# Patient Record
Sex: Male | Born: 1945
Health system: Southern US, Community
[De-identification: ages and names within clinical notes are randomized; demographics above are authoritative.]

## PROBLEM LIST (undated history)

## (undated) DIAGNOSIS — R1310 Dysphagia, unspecified: Secondary | ICD-10-CM

## (undated) DIAGNOSIS — G4733 Obstructive sleep apnea (adult) (pediatric): Secondary | ICD-10-CM

## (undated) DIAGNOSIS — N200 Calculus of kidney: Secondary | ICD-10-CM

## (undated) DIAGNOSIS — K449 Diaphragmatic hernia without obstruction or gangrene: Secondary | ICD-10-CM

## (undated) DIAGNOSIS — M1990 Unspecified osteoarthritis, unspecified site: Secondary | ICD-10-CM

## (undated) DIAGNOSIS — K5730 Diverticulosis of large intestine without perforation or abscess without bleeding: Secondary | ICD-10-CM

## (undated) DIAGNOSIS — I10 Essential (primary) hypertension: Secondary | ICD-10-CM

## (undated) DIAGNOSIS — T7840XA Allergy, unspecified, initial encounter: Secondary | ICD-10-CM

## (undated) DIAGNOSIS — E785 Hyperlipidemia, unspecified: Secondary | ICD-10-CM

## (undated) HISTORY — DX: Diaphragmatic hernia without obstruction or gangrene: K44.9

## (undated) HISTORY — DX: Obstructive sleep apnea (adult) (pediatric): G47.33

## (undated) HISTORY — DX: Hyperlipidemia, unspecified: E78.5

## (undated) HISTORY — PX: CARPAL TUNNEL RELEASE: SHX101

## (undated) HISTORY — PX: LITHOTRIPSY: SUR834

## (undated) HISTORY — PX: OTHER SURGICAL HISTORY: SHX169

## (undated) HISTORY — PX: CATARACT EXTRACTION: SUR2

## (undated) HISTORY — DX: Dysphagia, unspecified: R13.10

## (undated) HISTORY — DX: Allergy, unspecified, initial encounter: T78.40XA

## (undated) HISTORY — DX: Essential (primary) hypertension: I10

## (undated) HISTORY — DX: Calculus of kidney: N20.0

## (undated) HISTORY — DX: Diverticulosis of large intestine without perforation or abscess without bleeding: K57.30

---

## 1986-05-09 HISTORY — PX: APPENDECTOMY: SHX54

## 2003-03-12 ENCOUNTER — Emergency Department (HOSPITAL_COMMUNITY): Admission: EM | Admit: 2003-03-12 | Discharge: 2003-03-12 | Payer: Self-pay | Admitting: Emergency Medicine

## 2004-04-14 ENCOUNTER — Ambulatory Visit: Payer: Self-pay | Admitting: Family Medicine

## 2005-03-02 ENCOUNTER — Ambulatory Visit: Payer: Self-pay | Admitting: Family Medicine

## 2005-06-13 ENCOUNTER — Ambulatory Visit: Payer: Self-pay | Admitting: Family Medicine

## 2006-01-16 ENCOUNTER — Ambulatory Visit: Payer: Self-pay | Admitting: Family Medicine

## 2006-09-01 ENCOUNTER — Ambulatory Visit: Payer: Self-pay | Admitting: Gastroenterology

## 2006-09-15 ENCOUNTER — Ambulatory Visit: Payer: Self-pay | Admitting: Gastroenterology

## 2006-09-15 DIAGNOSIS — K5730 Diverticulosis of large intestine without perforation or abscess without bleeding: Secondary | ICD-10-CM

## 2006-09-15 HISTORY — DX: Diverticulosis of large intestine without perforation or abscess without bleeding: K57.30

## 2007-09-24 ENCOUNTER — Ambulatory Visit: Payer: Self-pay | Admitting: Family Medicine

## 2007-09-24 DIAGNOSIS — J22 Unspecified acute lower respiratory infection: Secondary | ICD-10-CM | POA: Insufficient documentation

## 2007-09-24 DIAGNOSIS — J069 Acute upper respiratory infection, unspecified: Secondary | ICD-10-CM | POA: Insufficient documentation

## 2007-09-24 DIAGNOSIS — J309 Allergic rhinitis, unspecified: Secondary | ICD-10-CM | POA: Insufficient documentation

## 2008-04-21 ENCOUNTER — Ambulatory Visit: Payer: Self-pay | Admitting: Internal Medicine

## 2008-04-21 DIAGNOSIS — N419 Inflammatory disease of prostate, unspecified: Secondary | ICD-10-CM | POA: Insufficient documentation

## 2008-04-21 LAB — CONVERTED CEMR LAB
Bilirubin Urine: NEGATIVE
Ketones, urine, test strip: NEGATIVE
Specific Gravity, Urine: 1.015
WBC Urine, dipstick: NEGATIVE
pH: 6

## 2008-05-23 ENCOUNTER — Encounter (INDEPENDENT_AMBULATORY_CARE_PROVIDER_SITE_OTHER): Payer: Self-pay | Admitting: Internal Medicine

## 2008-07-01 ENCOUNTER — Encounter (INDEPENDENT_AMBULATORY_CARE_PROVIDER_SITE_OTHER): Payer: Self-pay | Admitting: Internal Medicine

## 2008-07-09 ENCOUNTER — Ambulatory Visit: Payer: Self-pay | Admitting: Internal Medicine

## 2008-07-10 LAB — CONVERTED CEMR LAB
Calcium: 9.2 mg/dL (ref 8.4–10.5)
GFR calc Af Amer: 98 mL/min
GFR calc non Af Amer: 81 mL/min
Glucose, Bld: 100 mg/dL — ABNORMAL HIGH (ref 70–99)
HDL: 43 mg/dL (ref >39.0)
LDL Cholesterol: 120 mg/dL — ABNORMAL HIGH (ref 0–99)
Sodium: 139 meq/L (ref 135–145)
TSH: 0.77 microintl units/mL (ref 0.35–5.50)
VLDL: 12 mg/dL (ref 0–40)

## 2008-12-29 ENCOUNTER — Encounter (INDEPENDENT_AMBULATORY_CARE_PROVIDER_SITE_OTHER): Payer: Self-pay | Admitting: Internal Medicine

## 2009-02-13 ENCOUNTER — Ambulatory Visit: Payer: Self-pay | Admitting: Family Medicine

## 2009-02-13 DIAGNOSIS — H8110 Benign paroxysmal vertigo, unspecified ear: Secondary | ICD-10-CM

## 2009-05-04 ENCOUNTER — Telehealth: Payer: Self-pay | Admitting: Family Medicine

## 2009-05-12 ENCOUNTER — Ambulatory Visit: Payer: Self-pay | Admitting: Family Medicine

## 2009-05-12 DIAGNOSIS — R42 Dizziness and giddiness: Secondary | ICD-10-CM

## 2009-06-02 ENCOUNTER — Ambulatory Visit: Payer: Self-pay | Admitting: Family Medicine

## 2009-06-02 DIAGNOSIS — J019 Acute sinusitis, unspecified: Secondary | ICD-10-CM | POA: Insufficient documentation

## 2009-06-02 DIAGNOSIS — H6120 Impacted cerumen, unspecified ear: Secondary | ICD-10-CM

## 2009-06-19 ENCOUNTER — Ambulatory Visit: Payer: Self-pay | Admitting: Family Medicine

## 2009-12-15 ENCOUNTER — Encounter (INDEPENDENT_AMBULATORY_CARE_PROVIDER_SITE_OTHER): Payer: Self-pay | Admitting: *Deleted

## 2010-01-05 ENCOUNTER — Encounter: Payer: Self-pay | Admitting: Family Medicine

## 2010-02-04 ENCOUNTER — Encounter: Payer: Self-pay | Admitting: Family Medicine

## 2010-04-07 ENCOUNTER — Ambulatory Visit: Payer: Self-pay | Admitting: Internal Medicine

## 2010-06-08 NOTE — Letter (Signed)
Summary: Nadara Eaton letter  [place] [facility]  [address] [place], [place] [address]   Phone: [phone]  Fax: [phone]       12/15/2009 MRN: [id number]  [facility] [address] [place], [place]  [address]  Dear Mr. BAYRON,  [facility] [place], and [facility] announce the retirement of Arta Silence, M.D., from full-time practice at the [facility] office effective November 05, 2009 and his plans of returning part-time.  It is important to Dr. Hetty Ely and to our practice that you understand that [facility] has seven physicians in our office for your health care needs.  We will continue to offer the same exceptional care that you have today.    Dr. Hetty Ely has spoken to many of you about his plans for retirement and returning part-time in the fall.   We will continue to work with you through the transition to schedule appointments for you in the office and meet the high standards that [place] is committed to.   Again, it is with great pleasure that we share the news that Dr. Hetty Ely will return to [facility] in October of 2011 with a reduced schedule.    If you have any questions, or would like to request an appointment with one of our physicians, please call us at [phone] and press the option for Scheduling an appointment.  We take pleasure in providing you with excellent patient care and look forward to seeing you at your next office visit.  Our [facility] Physicians are:  Tillman Abide, M.D. Laurita Quint, M.D. Roxy Manns, M.D. Kerby Nora, M.D. Hannah Beat, M.D. Ruthe Mannan, M.D. We proudly welcomed Raechel Ache, M.D. and Eustaquio Boyden, M.D. to the practice in July/August 2011.  Sincerely,  [place] [facility] [facility]

## 2010-06-08 NOTE — Letter (Signed)
Summary: [facility]  [facility]   Imported By: Lanelle Bal 02/12/2010 09:05:50  _____________________________________________________________________  External Attachment:    Type:   Image     Comment:   External Document  Appended Document: [facility]    Clinical Lists Changes  Observations: Added new observation of PAST MED HX: Diverticulosis, colon (09/15/2006) all rhinitis  hx of nasal polyps PSA and nephrolithiasis per Dr. Aldean Ast (02/14/2010 22:40)       Past History:  Past Medical History: Diverticulosis, colon (09/15/2006) all rhinitis  hx of nasal polyps PSA and nephrolithiasis per Dr. Aldean Ast

## 2010-06-08 NOTE — Letter (Signed)
Summary: [facility]  [facility]   Imported By: Lanelle Bal 01/13/2010 14:04:07  _____________________________________________________________________  External Attachment:    Type:   Image     Comment:   External Document  Appended Document: [facility]    Clinical Lists Changes  Observations: Added new observation of PAST MED HX: Diverticulosis, colon (09/15/2006) all rhinitis  hx of nasal polyps nephrolithiasis per Dr. Aldean Ast (01/13/2010 22:58)       Past History:  Past Medical History: Diverticulosis, colon (09/15/2006) all rhinitis  hx of nasal polyps nephrolithiasis per Dr. Aldean Ast

## 2010-06-08 NOTE — Assessment & Plan Note (Signed)
Summary: COUGH,ST/CLE   Vital Signs:  Patient profile:   65 year old male Height:      69 inches Weight:      210.50 pounds BMI:     31.20 Temp:     97.6 degrees F oral Pulse rate:   68 / minute Pulse rhythm:   regular BP sitting:   110 / 60  (left arm) Cuff size:   large  Vitals Entered By: Delilah Shan, CMA (AAMA) (April 07, 2010 12:10 PM) CC: Cough, ST   History of Present Illness: CC: ST, cough  1wk h/o ST, congestion and cough at night.  Spitting up yellow sputum with cough.  + mild nausea.  Mild frontal sinus fullness.  Has tried advil 400mg  nightly, vicks vapo rub.  Cough keeping up at night.  Doesn't feel coughing up enough sputum.  h/o sinus infections and surgery in past.  h/o nasal polyps  No HA.  No f/c, abd pain, vomiting, rashes, myalgia/arthralgias.  No ear pain, tooth pain.  + grandson with cough.  daughter smokes outside.  Current Medications (verified): 1)  Flonase 50 Mcg/act Susp (Fluticasone Propionate) .... 2 Sprays in Each Nostril Once Daily  Allergies (verified): No Known Drug Allergies  Past History:  Past Medical History: Last updated: 02/14/2010 Diverticulosis, colon (09/15/2006) all rhinitis  hx of nasal polyps PSA and nephrolithiasis per Dr. Aldean Ast  Past Surgical History: Last updated: 06/02/2009 L) knee repair 1970's appendectomy 1988 colonoscopy 09/15/2006 sinus surgery for polyps   Social History: Last updated: 07/09/2008 Marital Status: Married Children: no children, 1 step daughter, 1 grand son Occupation: Surveyor, minerals  Review of Systems       per HPI  Physical Exam  General:  Well-developed,well-nourished,in no acute distress; alert,appropriate and cooperative throughout examination, nontoxic Head:  normocephalic, atraumatic, and no abnormalities observed.  No sinus tenderness Eyes:  no injection.   PERRLA, EOMI   Ears:  canals with small amt of cerumen only- much imp  TMs are clear  Nose:  no nasal discharge.    Mouth:  pharynx pink and moist.  no exudates Neck:  No deformities, masses, or tenderness noted.  + R AC LAD Lungs:  Normal respiratory effort, chest expands symmetrically. Lungs are clear to auscultation, no crackles or wheezes. Heart:  Normal rate and regular rhythm. S1 and S2 normal without gallop, murmur, click, rub or other extra sounds. Pulses:  2+ rad pulses Extremities:  no pedal edema Skin:  no rashes   Impression & Recommendations:  Problem # 1:  SINUSITIS - ACUTE-NOS (ICD-461.9) could be early sinusitis vs viral URTI.  treat supportively for now given only 1 wk sxs.  If worsening to call with update.  if not better by Monday, call for abx course.    The following medications were removed from the medication list:    Tessalon 200 Mg Caps (Benzonatate) .Marland Kitchen... 1 by mouth up to three times a day as needed cough His updated medication list for this problem includes:    Flonase 50 Mcg/act Susp (Fluticasone propionate) .Marland Kitchen... 2 sprays in each nostril once daily    Cheratussin Ac 100-10 Mg/45ml Syrp (Guaifenesin-codeine) ..... One teaspoon every 6 hours as needed cough, mainly for night time use  Complete Medication List: 1)  Flonase 50 Mcg/act Susp (Fluticasone propionate) .... 2 sprays in each nostril once daily 2)  Cheratussin Ac 100-10 Mg/72ml Syrp (Guaifenesin-codeine) .... One teaspoon every 6 hours as needed cough, mainly for night time use  Patient Instructions: 1)  You have a sinus infection.  Most are viral.   2)  Take medicines as prescribed: cheratussin for night time use 3)  Take guaifenesin 400mg  IR 1 pills in am and at noon with plenty of fluid to help mobilize mucous. 4)  Use nasal saline spray or neti pot to help drainage of sinuses. 5)  If you start having fevers >101.5, trouble swallowing or breathing, or are worsening instead of improving as expected, you may need to be seen again. 6)  Good to see you today, call clinic with questions.  Prescriptions: CHERATUSSIN  AC 100-10 MG/5ML SYRP (GUAIFENESIN-CODEINE) one teaspoon every 6 hours as needed cough, mainly for night time use  #100cc x 0   Entered and Authorized by:   Eustaquio Boyden  MD   Signed by:   Eustaquio Boyden  MD on 04/07/2010   Method used:   Print then Give to Patient   RxID:   [id number]    Orders Added: 1)  Est. Patient Level III [20254]    Current Allergies (reviewed today): No known allergies

## 2010-06-08 NOTE — Assessment & Plan Note (Signed)
Summary: 2-3 WEEK FOLLOW UP RECHECK AND FLUSH OUT EARS/RBH   Vital Signs:  Patient profile:   65 year old male Height:      69 inches Weight:      204.75 pounds BMI:     30.35 Temp:     98 degrees F oral Pulse rate:   64 / minute Pulse rhythm:   regular BP sitting:   116 / 78  (left arm) Cuff size:   large  Vitals Entered By: Lewanda Rife LPN (June 19, 2009 8:07 AM)  History of Present Illness: never used the debrox in his ears -neglected to do that   has been using baby oil instead they do not feel as plugged  still a tiny bit of dizziness when he gets up quickly  other symptoms are better     Allergies (verified): No Known Drug Allergies  Review of Systems General:  Denies chills, fatigue, fever, loss of appetite, and malaise. Eyes:  Denies discharge and eye irritation. ENT:  Denies ear discharge, earache, nasal congestion, postnasal drainage, sinus pressure, and sore throat. CV:  Denies chest pain or discomfort and palpitations. Resp:  Denies cough and wheezing. GI:  Denies nausea. Neuro:  Complains of sensation of room spinning; denies tingling, tremors, and visual disturbances. Allergy:  Complains of seasonal allergies.  Physical Exam  General:  Well-developed,well-nourished,in no acute distress; alert,appropriate and cooperative throughout examination Head:  normocephalic, atraumatic, and no abnormalities observed.   Eyes:  no injection.   no nystagmus  Ears:  canals with small amt of cerumen only- much imp  TMs are clear  Nose:  no nasal discharge.   Mouth:  pharynx pink and moist.   Neck:  No deformities, masses, or tenderness noted. Lungs:  Normal respiratory effort, chest expands symmetrically. Lungs are clear to auscultation, no crackles or wheezes. Heart:  Normal rate and regular rhythm. S1 and S2 normal without gallop, murmur, click, rub or other extra sounds. Skin:  Intact without suspicious lesions or rashes Cervical Nodes:  No lymphadenopathy  noted Psych:  normal affect, talkative and pleasant    Impression & Recommendations:  Problem # 1:  CERUMEN IMPACTION (ICD-380.4) Assessment Improved much improved after use of baby oil in ears  will continue that or debrox as needed  no need for irrigation  Problem # 2:  VERTIGO (ICD-780.4) Assessment: Improved much improved after tx of sinusitis and imp of cerumen impaction  still occ symptoms with change in position  will continue meclizine only if needed  adv that if symptoms are not resolved in 2 wk (or worse or new sympt) will ref to ENT for further eval  The following medications were removed from the medication list:    Promethazine Hcl 25 Mg Tabs (Promethazine hcl) .Marland Kitchen... 1 tab by mouth every 6 hours as needed nausea His updated medication list for this problem includes:    Meclizine Hcl 25 Mg Tabs (Meclizine hcl) .Marland Kitchen... 1 by mouth two times a day as needed vertigo  Problem # 3:  ALLERGIC RHINITIS (ICD-477.9) Assessment: Improved flonase helps- will continue this in season when he needs it  The following medications were removed from the medication list:    Promethazine Hcl 25 Mg Tabs (Promethazine hcl) .Marland Kitchen... 1 tab by mouth every 6 hours as needed nausea His updated medication list for this problem includes:    Flonase 50 Mcg/act Susp (Fluticasone propionate) .Marland Kitchen... 2 sprays in each nostril once daily  Complete Medication List: 1)  Meclizine Hcl 25  Mg Tabs (Meclizine hcl) .Marland Kitchen.. 1 by mouth two times a day as needed vertigo 2)  Valium 2 Mg Tabs (Diazepam) .... Take 1 at bedtime for vertigo 3)  Flonase 50 Mcg/act Susp (Fluticasone propionate) .... 2 sprays in each nostril once daily 4)  Tessalon 200 Mg Caps (Benzonatate) .Marland Kitchen.. 1 by mouth up to three times a day as needed cough  Patient Instructions: 1)  continue the baby oil or debrox (which you can buy over the counter) for ear wax 1-2 times per week  2)  continue flonase if you needed  3)  update me if vertigo is not gone  in several weeks   Current Allergies (reviewed today): No known allergies

## 2010-06-08 NOTE — Assessment & Plan Note (Signed)
Summary: sinus/dlo   Vital Signs:  Patient profile:   65 year old male Weight:      217 pounds Temp:     97.6 degrees F oral Pulse rate:   68 / minute Pulse rhythm:   regular BP sitting:   128 / 82  (left arm) Cuff size:   large  Vitals Entered By: Lowella Petties CMA (June 02, 2009 8:09 AM) CC: Sinus congestion and cough.   History of Present Illness: has ongoing sinus problems and cannot get rid of vertigo completely  cannot stop coughing   has been here 3 times -- for vertigo / is afraid to take the valium has not been taking meclizine (instead used otc dramamine - and that helps a bit)  vertigo is improved but not gone-- worst to turn over in bed/ has to move head slowly   nasal symptoms -- is congestion -- worse at night  nasal d/c is clear  coughed up a bit of green mucous  face hurt over eyes this weekend  no fever   R ear feels full but does not hurt   throat is ok -- but occ raw when he drinks pepsi   non smoker   no wheeze or sob   is prone to sinus infections     Allergies: No Known Drug Allergies  Past History:  Social History: Last updated: 07/09/2008 Marital Status: Married Children: no children, 1 step daughter, 1 grand son Occupation: Surveyor, minerals  Risk Factors: Alcohol Use: 1 (09/24/2007) Caffeine Use: 4 (09/24/2007) Exercise: no (09/24/2007)  Risk Factors: Smoking Status: quit (09/24/2007) Passive Smoke Exposure: no (07/09/2008)  Past Medical History: Diverticulosis, colon (09/15/2006) all rhinitis  hx of nasal polyps  Past Surgical History: L) knee repair 1970's appendectomy 1988 colonoscopy 09/15/2006 sinus surgery for polyps   Review of Systems General:  Complains of fatigue; denies chills and fever. Eyes:  Denies blurring and eye irritation. ENT:  Complains of earache, hoarseness, nasal congestion, postnasal drainage, sinus pressure, and sore throat. CV:  Denies chest pain or discomfort and palpitations. Resp:   Complains of cough and sputum productive; denies shortness of breath and wheezing. GI:  Denies diarrhea, nausea, and vomiting. Derm:  Denies lesion(s), poor wound healing, and rash. Neuro:  Complains of poor balance and sensation of room spinning; denies falling down, headaches, numbness, and tingling. Heme:  Denies abnormal bruising and bleeding.  Physical Exam  General:  alert, well-developed, well-nourished, and well-hydrated.  NAD Head:  normocephalic, atraumatic, and no abnormalities observed.  no sinus or temporal tenderness  Eyes:  vision grossly intact, pupils equal, pupils round, pupils reactive to light, and no injection.   1-2 beats horizontal nystagmus  Ears:  cerumen impaction- worse on R  Nose:  nares are injected and congested bilat Mouth:  pharynx pink and moist, no erythema, and no exudates.   Neck:  supple with full rom and no masses or thyromegally, no JVD or carotid bruit  Lungs:  Normal respiratory effort, chest expands symmetrically. Lungs are clear to auscultation, no crackles or wheezes. Heart:  Normal rate and regular rhythm. S1 and S2 normal without gallop, murmur, click, rub or other extra sounds. Neurologic:  cranial nerves II-XII intact, sensation intact to pinprick, and gait normal.  nl pronator drift no focal cerebellar signs  Skin:  Intact without suspicious lesions or rashes Cervical Nodes:  No lymphadenopathy noted Psych:  normal affect, talkative and pleasant    Impression & Recommendations:  Problem # 1:  SINUSITIS -  ACUTE-NOS (ICD-461.9) Assessment New  with congestion for several mo in pt with past hx of sinus surgery  suspect vertigo is related will tx with augmentin and flonase- update  tessalon for cough if not imp- consider ENT consult His updated medication list for this problem includes:    Augmentin 875-125 Mg Tabs (Amoxicillin-pot clavulanate) .Marland Kitchen... 1 by mouth two times a day for 10 days for sinus infection    Flonase 50 Mcg/act Susp  (Fluticasone propionate) .Marland Kitchen... 2 sprays in each nostril once daily    Tessalon 200 Mg Caps (Benzonatate) .Marland Kitchen... 1 by mouth up to three times a day as needed cough  Orders: Prescription Created Electronically [phone])  Problem # 2:  VERTIGO (ICD-780.4) Assessment: Improved  improved but not gone hope with tx of sinus infx this will imp / also cerumen impaction if not imp in 10 d-- consider ent ref  His updated medication list for this problem includes:    Meclizine Hcl 25 Mg Tabs (Meclizine hcl) .Marland Kitchen... 1 by mouth two times a day as needed vertigo    Promethazine Hcl 25 Mg Tabs (Promethazine hcl) .Marland Kitchen... 1 tab by mouth every 6 hours as needed nausea  Orders: Prescription Created Electronically [phone])  Problem # 3:  CERUMEN IMPACTION (ICD-380.4) Assessment: New  worse on R and dry appearing  adv to use debrox 3 times weekly- and then f/u to irrigate  adv to update if ear pain or worse  Orders: Prescription Created Electronically [phone])  Complete Medication List: 1)  Meclizine Hcl 25 Mg Tabs (Meclizine hcl) .Marland Kitchen.. 1 by mouth two times a day as needed vertigo 2)  Promethazine Hcl 25 Mg Tabs (Promethazine hcl) .Marland Kitchen.. 1 tab by mouth every 6 hours as needed nausea 3)  Valium 2 Mg Tabs (Diazepam) .... Take 1 at bedtime for vertigo 4)  Augmentin 875-125 Mg Tabs (Amoxicillin-pot clavulanate) .Marland Kitchen.. 1 by mouth two times a day for 10 days for sinus infection 5)  Flonase 50 Mcg/act Susp (Fluticasone propionate) .... 2 sprays in each nostril once daily 6)  Tessalon 200 Mg Caps (Benzonatate) .Marland Kitchen.. 1 by mouth up to three times a day as needed cough  Patient Instructions: 1)  px were sent to [facility]  2)  take the augmentin as directed and use nasal spray for at least 1 month 3)  tessalon for cough as needed 4)  update me if symptoms worsen  5)  if symptoms -- congestion and dizziness- are not better in 10 days- please call for ENT referral  6)  get debrox ear solution over the counter- and use as  directed 3 times per week to loosen ear wax  7)  follow up here in 2-3 weeks - to re check and flush out ears  Prescriptions: TESSALON 200 MG CAPS (BENZONATATE) 1 by mouth up to three times a day as needed cough  #30 x 0   Entered and Authorized by:   Judith Part MD   Signed by:   Judith Part MD on 06/02/2009   Method used:   Electronically to        [facility]* (retail)       [address]       [place], [place]  [address]       Ph: [phone]       Fax: [phone]   RxID:   [id number] FLONASE 50 MCG/ACT SUSP (FLUTICASONE PROPIONATE) 2 sprays in each nostril once daily  #1 mdi x 3   Entered and Authorized  by:   Judith Part MD   Signed by:   Judith Part MD on 06/02/2009   Method used:   Electronically to        [facility]* (retail)       [address]       [place], [place]  [address]       Ph: [phone]       Fax: [phone]   RxID:   [id number] AUGMENTIN 875-125 MG TABS (AMOXICILLIN-POT CLAVULANATE) 1 by mouth two times a day for 10 days for sinus infection  #20 x 0   Entered and Authorized by:   Judith Part MD   Signed by:   Judith Part MD on 06/02/2009   Method used:   Electronically to        [facility]* (retail)       [address]       [place], [place]  [address]       Ph: [phone]       Fax: [phone]   RxID:   [id number]   Prior Medications (reviewed today): MECLIZINE HCL 25 MG TABS (MECLIZINE HCL) 1 by mouth two times a day as needed vertigo PROMETHAZINE HCL 25 MG TABS (PROMETHAZINE HCL) 1 tab by mouth every 6 hours as needed nausea VALIUM 2 MG TABS (DIAZEPAM) take 1 at bedtime for vertigo AUGMENTIN 875-125 MG TABS (AMOXICILLIN-POT CLAVULANATE) 1 by mouth two times a day for 10 days for sinus infection FLONASE 50 MCG/ACT SUSP (FLUTICASONE PROPIONATE) 2 sprays in each nostril once daily TESSALON 200 MG CAPS (BENZONATATE) 1 by mouth up to three times a day as needed cough Current Allergies: No known  allergies

## 2010-06-08 NOTE — Assessment & Plan Note (Signed)
Summary: SINUS INFECTION, VERTIGO   Vital Signs:  Patient profile:   65 year old male Height:      69 inches Weight:      214.25 pounds BMI:     31.75 Temp:     98 degrees F oral Pulse rate:   64 / minute Pulse rhythm:   regular BP sitting:   116 / 80  (left arm) Cuff size:   large  Vitals Entered By: Lewanda Rife LPN (May 12, 2009 2:19 PM)  CC:  sinus infection, vertigo, and head congested and pressure feeling in forehead.  History of Present Illness: Here for URI signs--mild, dizziness/ swimmiheaded --onset 12/25 or so --taking Meclazine 12.5-takes 2 at a time  ~3-4 times a day--helps, but not resolved --post nasal drip --has hx of vertigo, responded to meclazine --non-smoker  Allergies (verified): No Known Drug Allergies  Past History:  Past Medical History: Reviewed history from 09/26/2007 and no changes required. Diverticulosis, colon (09/15/2006)  Review of Systems ENT:  See HPI; Complains of nasal congestion and sore throat; denies earache; cant "pop" ears. Resp:  Complains of cough; denies shortness of breath and wheezing. GI:  Denies nausea and vomiting. Neuro:  See HPI.  Physical Exam  General:  alert, well-developed, well-nourished, and well-hydrated.  NAD Ears:  TMs retracted with increased fluid Nose:  no mucosal edema, no airflow obstruction, and mucosal erythema.  sinuses neg Mouth:  no exudates and pharyngeal erythema.   Lungs:  normal respiratory effort, no intercostal retractions, no accessory muscle use, normal breath sounds, no crackles, and no wheezes.  moist cough only Neurologic:  alert & oriented X3, cranial nerves II-XII intact, strength normal in all extremities, sensation intact to light touch, gait normal, and finger-to-nose normal.   Psych:  normally interactive and good eye contact.     Impression & Recommendations:  Problem # 1:  VERTIGO (ICD-780.4) Assessment Deteriorated reoccurance of mild vertigo  will continue use of  meclizine during the day and try low dose valium at hs--understands see back in 7-10d if not improved His updated medication list for this problem includes:    Meclizine Hcl 25 Mg Tabs (Meclizine hcl) .Marland Kitchen... 1 by mouth two times a day as needed vertigo    Promethazine Hcl 25 Mg Tabs (Promethazine hcl) .Marland Kitchen... 1 tab by mouth every 6 hours as needed nausea  Problem # 2:  URI (ICD-465.9) Assessment: New meclazine should help with ears, continue comfort care measures: increase po fluids, rest, tylenol or IBP as needed  see back if signs worsen His updated medication list for this problem includes:    Promethazine Hcl 25 Mg Tabs (Promethazine hcl) .Marland Kitchen... 1 tab by mouth every 6 hours as needed nausea  Complete Medication List: 1)  Meclizine Hcl 25 Mg Tabs (Meclizine hcl) .Marland Kitchen.. 1 by mouth two times a day as needed vertigo 2)  Promethazine Hcl 25 Mg Tabs (Promethazine hcl) .Marland Kitchen.. 1 tab by mouth every 6 hours as needed nausea 3)  Valium 2 Mg Tabs (Diazepam) .... Take 1 at bedtime for vertigo Prescriptions: VALIUM 2 MG TABS (DIAZEPAM) take 1 at bedtime for vertigo  #30 x 0   Entered and Authorized by:   Gildardo Griffes FNP   Signed by:   Gildardo Griffes FNP on 05/12/2009   Method used:   Print then Give to Patient   RxID:   [id number]   Current Allergies (reviewed today): No known allergies

## 2011-01-18 ENCOUNTER — Ambulatory Visit (INDEPENDENT_AMBULATORY_CARE_PROVIDER_SITE_OTHER): Payer: BC Managed Care – PPO | Admitting: Family Medicine

## 2011-01-18 ENCOUNTER — Encounter: Payer: Self-pay | Admitting: Family Medicine

## 2011-01-18 VITALS — BP 122/60 | HR 60 | Temp 97.5°F | Wt 209.1 lb

## 2011-01-18 DIAGNOSIS — R300 Dysuria: Secondary | ICD-10-CM | POA: Insufficient documentation

## 2011-01-18 LAB — POCT URINALYSIS DIPSTICK
Glucose, UA: NEGATIVE
Ketones, UA: NEGATIVE
Protein, UA: NEGATIVE
Spec Grav, UA: 1.02

## 2011-01-18 MED ORDER — CIPROFLOXACIN HCL 500 MG PO TABS: 500.0000 mg | ORAL_TABLET | Freq: Two times a day (BID) | ORAL | Status: AC

## 2011-01-18 NOTE — Progress Notes (Signed)
Pain with urination. Some stinging with urination, mild.  It feels similar to prev.  Had seen Dr. Damian Leavell with uro. Recent sx started over the weekend.  He had some chills over the weekend.  Pain with urination started about the same time.  No vomiting, diarrhea.  Feels some better now, but still with dysuria.  Has f/u with uro in October. No abd pain (h/o kidney stones).  No testicle pain.  Monogamous.    Meds, vitals, and allergies reviewed.   ROS: See HPI.  Otherwise, noncontributory.  nad rrr ctab Prostate not ttp or boggy Testes bilaterally descended without nodularity, tenderness or masses. No scrotal masses or lesions. No penis lesions or urethral discharge. UA with 1+ LE.

## 2011-01-18 NOTE — Patient Instructions (Signed)
Start the antibiotics today.  We'll contact you with your lab report. Let us know if you aren't gradually improving.  Take care.

## 2011-01-18 NOTE — Assessment & Plan Note (Addendum)
Check ucx and start cipro.  If this is prostatitis, it's mild. I would start with 2 weeks of abx given the exam and then f/u prn.  Goal to balance treatment with potential side effects of the abx.  D/w pt and he understood, okay for outpatient f/u.

## 2011-01-23 LAB — URINE CULTURE: Colony Count: 15000

## 2011-11-25 ENCOUNTER — Ambulatory Visit (INDEPENDENT_AMBULATORY_CARE_PROVIDER_SITE_OTHER): Payer: BC Managed Care – PPO | Admitting: Family Medicine

## 2011-11-25 DIAGNOSIS — Z639 Problem related to primary support group, unspecified: Secondary | ICD-10-CM

## 2011-11-28 DIAGNOSIS — Z639 Problem related to primary support group, unspecified: Secondary | ICD-10-CM | POA: Insufficient documentation

## 2011-11-28 NOTE — Progress Notes (Signed)
Pt came in with spouse to discuss daughter's recent event.  See spouse's and daughter's record.  Daughter wasn't present.  I didn't give information about daughter, I only listened and asked questions.  Recent history given by daughter verified.  I'll await written permission from daughter to talk with parents.  No charge for visit.   [place],Becky T  Verba,Hiep V Hargett,Meredith L

## 2012-01-31 ENCOUNTER — Other Ambulatory Visit: Payer: Self-pay | Admitting: Family Medicine

## 2012-01-31 DIAGNOSIS — M25569 Pain in unspecified knee: Secondary | ICD-10-CM

## 2012-01-31 NOTE — Progress Notes (Signed)
Pt's wife reported that pt wanted ortho referral.  Done.

## 2012-03-13 ENCOUNTER — Ambulatory Visit (INDEPENDENT_AMBULATORY_CARE_PROVIDER_SITE_OTHER): Payer: Medicare Other | Admitting: Family Medicine

## 2012-03-13 ENCOUNTER — Encounter: Payer: Self-pay | Admitting: Family Medicine

## 2012-03-13 VITALS — BP 120/80 | HR 64 | Temp 97.7°F | Wt 211.0 lb

## 2012-03-13 DIAGNOSIS — R109 Unspecified abdominal pain: Secondary | ICD-10-CM

## 2012-03-13 LAB — COMPREHENSIVE METABOLIC PANEL
Albumin: 4.3 g/dL (ref 3.5–5.2)
BUN: 18 mg/dL (ref 6–23)
Calcium: 9.2 mg/dL (ref 8.4–10.5)
Chloride: 102 mEq/L (ref 96–112)
Glucose, Bld: 92 mg/dL (ref 70–99)
Potassium: 4.1 mEq/L (ref 3.5–5.1)
Total Protein: 7.6 g/dL (ref 6.0–8.3)

## 2012-03-13 LAB — CBC WITH DIFFERENTIAL/PLATELET
Basophils Relative: 0.4 % (ref 0.0–3.0)
Eosinophils Relative: 3 % (ref 0.0–5.0)
Lymphocytes Relative: 19.8 % (ref 12.0–46.0)
Monocytes Relative: 8.5 % (ref 3.0–12.0)
Neutrophils Relative %: 68.3 % (ref 43.0–77.0)
RBC: 4.34 Mil/uL (ref 4.22–5.81)
WBC: 10.7 10*3/uL — ABNORMAL HIGH (ref 4.5–10.5)

## 2012-03-13 LAB — POCT URINALYSIS DIPSTICK
Leukocytes, UA: NEGATIVE
Spec Grav, UA: 1.025
Urobilinogen, UA: NEGATIVE
pH, UA: 7

## 2012-03-13 NOTE — Patient Instructions (Addendum)
Go to the lab on the way out.  We'll contact you with your lab report. Call with an update tomorrow.  If you suddenly have profound abdominal pain, or pain that doesn't relent, then go to the ER.  Take care.

## 2012-03-13 NOTE — Progress Notes (Signed)
Had an upset stomach ("I felt like I would have diarrhea but I didn't") over the weekend but that resolved and was okay until last night.  Last night he had R sided abd pain.  No trigger known last night.  Happened before going to sleep, lasted about 15 min then resolved.  Was able to sleep okay last night.  Minimal pain this AM but then got worse again today at lunch time, before eating lunch.  Pain decreased in the meantime.  Did eat breakfast today.  Had felt bloated some after eating.  No diarrhea, blood in stool, vomiting.  No fevers.   Urine looked darker than normal this AM.  Not burning with urination.  No pelvic pain.  No constipation.  Hasn't needed pain meds recently.    H/o appendectomy.  colonoscopy 2008.  Knee surgery 02/23/12.   PMH and SH reviewed  ROS: See HPI, otherwise noncontributory.  Meds, vitals, and allergies reviewed.   GEN: nad, alert and oriented, not uncomfortable.  HEENT: mucous membranes moist NECK: supple w/o LA CV: rrr.  PULM: ctab, no inc wob ABD: soft, +bs, not ttp except for very minimal RLQ tenderness w/o rebound, no guarding EXT: no edema SKIN: no acute rash

## 2012-03-14 ENCOUNTER — Encounter: Payer: Self-pay | Admitting: Family Medicine

## 2012-03-14 ENCOUNTER — Ambulatory Visit: Payer: BC Managed Care – PPO | Admitting: Family Medicine

## 2012-03-14 DIAGNOSIS — R109 Unspecified abdominal pain: Secondary | ICD-10-CM | POA: Insufficient documentation

## 2012-03-14 LAB — URINALYSIS, MICROSCOPIC ONLY
Bacteria, UA: NONE SEEN
Casts: NONE SEEN
RBC / HPF: >50 RBC/hpf — AB (ref <3)

## 2012-03-14 NOTE — Assessment & Plan Note (Addendum)
H/o diverticulosis but with waxing/waning sx, lack of fever, lack of LLQ pain, no diarrhea or blood in stool.  I don't suspect ominous dx.  Nontoxic on exam.  See notes on labs.  We agreed to hold off on scanning his abd and checking basic labs.  He is nontoxic.  I'll check on him tomorrow.    Addendum.  I called pt.  No more pain.  Feeling well.  D/w pt about labs. I doubt bladder or GI pathology.  He has history of very brief tobacco exposure in hight school, about 40 years ago.  I would recheck u/a in 1 week and f/u on ucx.  If all neg, then I would presume that he had small renal stone that caused his sx.  If recurrent sx or blood on u/a on f/u, that would direct further w/u.  He agrees with plan.  >25 min spent with face to face with patient, >50% counseling and/or coordinating care. He'll call back to set up lab appointment.

## 2012-03-15 ENCOUNTER — Encounter: Payer: Self-pay | Admitting: *Deleted

## 2012-03-15 LAB — URINE CULTURE: Colony Count: NO GROWTH

## 2012-03-21 ENCOUNTER — Ambulatory Visit (INDEPENDENT_AMBULATORY_CARE_PROVIDER_SITE_OTHER): Payer: Medicare Other | Admitting: Family Medicine

## 2012-03-21 DIAGNOSIS — R109 Unspecified abdominal pain: Secondary | ICD-10-CM

## 2012-03-21 LAB — POCT URINALYSIS DIPSTICK
Leukocytes, UA: NEGATIVE
Nitrite, UA: NEGATIVE
Urobilinogen, UA: 0.2
pH, UA: 5

## 2012-04-19 NOTE — Progress Notes (Signed)
Patient was seen by the nurse.

## 2012-04-28 ENCOUNTER — Encounter: Payer: Self-pay | Admitting: Family Medicine

## 2012-04-28 DIAGNOSIS — N200 Calculus of kidney: Secondary | ICD-10-CM | POA: Insufficient documentation

## 2012-04-28 DIAGNOSIS — R319 Hematuria, unspecified: Secondary | ICD-10-CM | POA: Insufficient documentation

## 2012-05-29 DIAGNOSIS — M25569 Pain in unspecified knee: Secondary | ICD-10-CM | POA: Insufficient documentation

## 2012-06-24 ENCOUNTER — Other Ambulatory Visit: Payer: Self-pay

## 2012-08-10 ENCOUNTER — Emergency Department (HOSPITAL_COMMUNITY)
Admission: EM | Admit: 2012-08-10 | Discharge: 2012-08-10 | Disposition: A | Discharge status: Home / Self Care | Payer: Medicare Other | Attending: Emergency Medicine | Admitting: Emergency Medicine

## 2012-08-10 ENCOUNTER — Emergency Department (HOSPITAL_COMMUNITY): Payer: Medicare Other

## 2012-08-10 ENCOUNTER — Encounter (HOSPITAL_COMMUNITY): Payer: Self-pay | Admitting: Emergency Medicine

## 2012-08-10 DIAGNOSIS — Z8709 Personal history of other diseases of the respiratory system: Secondary | ICD-10-CM | POA: Insufficient documentation

## 2012-08-10 DIAGNOSIS — R3915 Urgency of urination: Secondary | ICD-10-CM | POA: Insufficient documentation

## 2012-08-10 DIAGNOSIS — N200 Calculus of kidney: Secondary | ICD-10-CM | POA: Insufficient documentation

## 2012-08-10 DIAGNOSIS — R319 Hematuria, unspecified: Secondary | ICD-10-CM | POA: Insufficient documentation

## 2012-08-10 DIAGNOSIS — N390 Urinary tract infection, site not specified: Secondary | ICD-10-CM | POA: Insufficient documentation

## 2012-08-10 DIAGNOSIS — Z8719 Personal history of other diseases of the digestive system: Secondary | ICD-10-CM | POA: Insufficient documentation

## 2012-08-10 LAB — CBC WITH DIFFERENTIAL/PLATELET
Basophils Absolute: 0 10*3/uL (ref 0.0–0.1)
Basophils Relative: 0 % (ref 0–1)
Eosinophils Absolute: 0.2 10*3/uL (ref 0.0–0.7)
Eosinophils Relative: 3 % (ref 0–5)
MCH: 31.8 pg (ref 26.0–34.0)
MCHC: 34.7 g/dL (ref 30.0–36.0)
MCV: 91.6 fL (ref 78.0–100.0)
Neutrophils Relative %: 58 % (ref 43–77)
Platelets: 258 10*3/uL (ref 150–400)
RBC: 4.15 MIL/uL — ABNORMAL LOW (ref 4.22–5.81)
RDW: 12.5 % (ref 11.5–15.5)

## 2012-08-10 LAB — COMPREHENSIVE METABOLIC PANEL
AST: 20 U/L (ref 0–37)
Albumin: 3.8 g/dL (ref 3.5–5.2)
Chloride: 103 mEq/L (ref 96–112)
Creatinine, Ser: 1.07 mg/dL (ref 0.50–1.35)
Total Bilirubin: 0.7 mg/dL (ref 0.3–1.2)
Total Protein: 6.8 g/dL (ref 6.0–8.3)

## 2012-08-10 LAB — URINALYSIS, ROUTINE W REFLEX MICROSCOPIC
Ketones, ur: 15 mg/dL — AB
Nitrite: POSITIVE — AB
Protein, ur: 100 mg/dL — AB
Specific Gravity, Urine: 1.037 — ABNORMAL HIGH (ref 1.005–1.030)
Urobilinogen, UA: 1 mg/dL (ref 0.0–1.0)

## 2012-08-10 LAB — URINE MICROSCOPIC-ADD ON

## 2012-08-10 MED ORDER — OXYCODONE-ACETAMINOPHEN 5-325 MG PO TABS: 2.0000 | ORAL_TABLET | ORAL | Status: DC | PRN

## 2012-08-10 MED ORDER — DEXTROSE 5 % IV SOLN
1.0000 g | Freq: Once | INTRAVENOUS | Status: AC
  Administered 2012-08-10: 1 g via INTRAVENOUS
  Filled 2012-08-10: qty 10

## 2012-08-10 MED ORDER — CIPROFLOXACIN HCL 500 MG PO TABS: 500.0000 mg | ORAL_TABLET | Freq: Two times a day (BID) | ORAL | Status: DC

## 2012-08-10 MED ORDER — SODIUM CHLORIDE 0.9 % IV BOLUS (SEPSIS)
1000.0000 mL | Freq: Once | INTRAVENOUS | Status: AC
  Administered 2012-08-10: 1000 mL via INTRAVENOUS

## 2012-08-10 MED ORDER — TAMSULOSIN HCL 0.4 MG PO CAPS: 0.4000 mg | ORAL_CAPSULE | Freq: Every day | ORAL | Status: DC

## 2012-08-10 NOTE — ED Provider Notes (Signed)
History     CSN: [id number]  Arrival date & time 08/10/12  1614   First MD Initiated Contact with Patient 08/10/12 1738      Chief Complaint  Patient presents with  . Nephrolithiasis    (Consider location/radiation/quality/duration/timing/severity/associated sxs/prior treatment) The history is provided by the patient.  Trevor Ross is a 67 y.o. male hx of diverticulosis, kidney stone here with hematuria and flank pain. Hematuria intermittent for a month, worse yesterday. Also has urgency but no frequency. Pain Had xrays in Dec that showed multiple kidney stones and had a cystoscopy that was normal. No previous CTs performed. He also has bilateral flank pain for the last month that occasionally radiated to his groin. No fever or chills or vomiting or constipation or diarrhea.    Past Medical History  Diagnosis Date  . Allergy   . Diverticulosis of colon 09/15/2006  . Nasal polyps   . Nephrolithiasis     PSA per Dr. Aldean Ast    Past Surgical History  Procedure Laterality Date  . Appendectomy  1988  . Left knee repair  1970's  . Sinus surgery for polyps      History reviewed. No pertinent family history.  History  Substance Use Topics  . Smoking status: Never Smoker   . Smokeless tobacco: Not on file  . Alcohol Use: Not on file      Review of Systems  Genitourinary: Positive for hematuria and flank pain.    Allergies  Review of patient's allergies indicates no known allergies.  Home Medications   Current Outpatient Rx  Name  Route  Sig  Dispense  Refill  . naproxen sodium (ANAPROX) 220 MG tablet   Oral   Take 220 mg by mouth as needed. Pain           BP 135/81  Pulse 71  Temp(Src) 98 F (36.7 C) (Oral)  Resp 20  SpO2 99%  Physical Exam  Nursing note and vitals reviewed. Constitutional: He is oriented to person, place, and time. He appears well-developed and well-nourished.  Comfortable, NAD   HENT:  Head: Normocephalic.  Mouth/Throat:  Oropharynx is clear and moist.  Eyes: Conjunctivae are normal. Pupils are equal, round, and reactive to light.  Neck: Normal range of motion. Neck supple.  Cardiovascular: Normal rate, regular rhythm and normal heart sounds.   Pulmonary/Chest: Effort normal and breath sounds normal. No respiratory distress. He has no wheezes. He has no rales.  Abdominal: Soft. Bowel sounds are normal. He exhibits no distension.  Mild suprapubic tenderness, No CVAT   Musculoskeletal: Normal range of motion. He exhibits no edema and no tenderness.  Neurological: He is alert and oriented to person, place, and time.  Skin: Skin is warm and dry.  Psychiatric: He has a normal mood and affect. His behavior is normal. Judgment and thought content normal.    ED Course  Procedures (including critical care time)  Labs Reviewed  URINALYSIS, ROUTINE W REFLEX MICROSCOPIC - Abnormal; Notable for the following:    Color, Urine RED (*)    APPearance TURBID (*)    Specific Gravity, Urine 1.037 (*)    Hgb urine dipstick LARGE (*)    Bilirubin Urine LARGE (*)    Ketones, ur 15 (*)    Protein, ur 100 (*)    Nitrite POSITIVE (*)    Leukocytes, UA MODERATE (*)    All other components within normal limits  CBC WITH DIFFERENTIAL - Abnormal; Notable for the following:  RBC 4.15 (*)    HCT 38.0 (*)    All other components within normal limits  COMPREHENSIVE METABOLIC PANEL - Abnormal; Notable for the following:    Glucose, Bld 128 (*)    GFR calc non Af Amer 71 (*)    GFR calc Af Amer 82 (*)    All other components within normal limits  URINE MICROSCOPIC-ADD ON - Abnormal; Notable for the following:    Bacteria, UA MANY (*)    Casts HYALINE CASTS (*)    All other components within normal limits  URINE CULTURE   Ct Abdomen Pelvis Wo Contrast  08/10/2012  *RADIOLOGY REPORT*  Clinical Data: Flank pain.  Rule out stone.  History of a stone diagnosed 1 month ago.  Lower abdominal pain, lower back pain, dysuria,  hematuria.  No fever.  CT ABDOMEN AND PELVIS WITHOUT CONTRAST  Technique:  Multidetector CT imaging of the abdomen and pelvis was performed following the standard protocol without intravenous contrast.  Comparison: CT of the abdomen and pelvis 03/26/2012  Findings: Images of the lung bases are unremarkable.  A calculus measures 6 mm within the right ureteral pelvic junction and causes moderate hydronephrosis and stranding in the region of the renal pelvis.  On the right, no other intrarenal calculi or additional ureteral stones are identified.  Left kidney shows nonobstructing intrarenal stone, measuring one - 2 mm in the mid pole region. The left ureter is unremarkable in its course.  No focal abnormality is identified within the liver, spleen, pancreas, or adrenal glands.  The gallbladder is present.  The stomach and small bowel loops are normal in appearance. Colonic loops contain a moderate amount of stool and otherwise appears normal The appendix is well seen and has a normal appearance.  No free pelvic fluid or pelvic adenopathy.  There is atherosclerotic calcification of the abdominal aorta and iliac arteries.  No aneurysm.  Degenerative changes are seen in the lower thoracic and lumbar spine.  There is anterolisthesis of L5 on S1, associated with bilateral pars defects at L5. Vertebral hemangiomas are present.  IMPRESSION:  1.  Obstructing right ureteral pelvic junction calculus measuring 6 mm and associated with mild stranding. 2.  Small intrarenal calculus in the left kidney, nonobstructing. 3.  Anterolisthesis of L5 on this one, associated pars defects at L5.   Original Report Authenticated By: Norva Pavlov, M.D.      No diagnosis found.    MDM  Trevor Ross is a 67 y.o. male here with hematuria and flank pain. Will need to r/o pyelo vs complicated UTI vs stone vs mass. Will get CT ab/pel and get labs and UA. Pain free now.   7:57 PM Labs unremarkable. UA + UTI and blood. CT showed 6 mm R  UPJ stone. I am concerned for possible infected stone. But he is well appearing, afebrile, not septic appearing. I discussed with the urologist covering his group, Dr. Isabel Caprice, who recommend cipro and flomax. He will make an appointment next week. Return precautions given to patient.         Richardean Canal, MD 08/10/12 [phone]

## 2012-08-10 NOTE — ED Notes (Signed)
Patient was diagnosed with a kidney stone about a month ago.  Patient presents with lower abdominal pain, low back pain, and dysuria, and hematuria.  Patient denies fevers.

## 2012-08-11 LAB — URINE CULTURE: Colony Count: 1000

## 2012-08-16 ENCOUNTER — Encounter (HOSPITAL_COMMUNITY): Payer: Self-pay | Admitting: *Deleted

## 2012-08-16 ENCOUNTER — Other Ambulatory Visit: Payer: Self-pay | Admitting: Urology

## 2012-08-16 NOTE — Pre-Procedure Instructions (Signed)
Asked to bring blue folder the day of the procedure,insurance card,I.D. driver's license,wear comfortable clothing and have a driver for the day. Asked not to take Advil,Motrin,Ibuprofen,Aleve or any NSAIDS, Aspirin, Aspirin products or Toradol for 72 hours prior to procedure,  No vitamins or herbal medications 7 days prior to procedure. Instructed to take laxative per doctor's office instructions and eat a light dinner the evening before procedure.   To arrive at 0915 for lithotripsy procedure. 

## 2012-08-17 ENCOUNTER — Encounter (HOSPITAL_COMMUNITY): Payer: Self-pay | Admitting: Pharmacy Technician

## 2012-08-20 ENCOUNTER — Encounter (HOSPITAL_COMMUNITY)
Admission: RE | Disposition: A | Discharge status: Home / Self Care | Payer: Self-pay | Source: Ambulatory Visit | Attending: Urology

## 2012-08-20 ENCOUNTER — Ambulatory Visit (HOSPITAL_COMMUNITY)
Admission: RE | Admit: 2012-08-20 | Discharge: 2012-08-20 | Disposition: A | Discharge status: Home / Self Care | Payer: Medicare Other | Source: Ambulatory Visit | Attending: Urology | Admitting: Urology

## 2012-08-20 ENCOUNTER — Encounter (HOSPITAL_COMMUNITY): Payer: Self-pay | Admitting: *Deleted

## 2012-08-20 ENCOUNTER — Ambulatory Visit (HOSPITAL_COMMUNITY): Payer: Medicare Other

## 2012-08-20 DIAGNOSIS — Z79899 Other long term (current) drug therapy: Secondary | ICD-10-CM | POA: Insufficient documentation

## 2012-08-20 DIAGNOSIS — Z87891 Personal history of nicotine dependence: Secondary | ICD-10-CM | POA: Insufficient documentation

## 2012-08-20 DIAGNOSIS — N200 Calculus of kidney: Secondary | ICD-10-CM | POA: Insufficient documentation

## 2012-08-20 DIAGNOSIS — N411 Chronic prostatitis: Secondary | ICD-10-CM | POA: Insufficient documentation

## 2012-08-20 DIAGNOSIS — R300 Dysuria: Secondary | ICD-10-CM | POA: Insufficient documentation

## 2012-08-20 HISTORY — DX: Unspecified osteoarthritis, unspecified site: M19.90

## 2012-08-20 SURGERY — LITHOTRIPSY, ESWL
Anesthesia: LOCAL | Laterality: Right

## 2012-08-20 MED ORDER — OXYCODONE-ACETAMINOPHEN 5-325 MG PO TABS
1.0000 | ORAL_TABLET | ORAL | Status: DC | PRN
  Administered 2012-08-20: 2 via ORAL

## 2012-08-20 MED ORDER — OXYCODONE-ACETAMINOPHEN 5-325 MG PO TABS
ORAL_TABLET | ORAL | Status: AC
  Filled 2012-08-20: qty 2

## 2012-08-20 MED ORDER — CIPROFLOXACIN HCL 500 MG PO TABS
500.0000 mg | ORAL_TABLET | ORAL | Status: DC
  Filled 2012-08-20: qty 1

## 2012-08-20 MED ORDER — SODIUM CHLORIDE 0.9 % IV SOLN
INTRAVENOUS | Status: DC
  Administered 2012-08-20: 10:00:00 via INTRAVENOUS

## 2012-08-20 MED ORDER — DIAZEPAM 5 MG PO TABS
10.0000 mg | ORAL_TABLET | ORAL | Status: AC
  Administered 2012-08-20: 10 mg via ORAL
  Filled 2012-08-20: qty 2

## 2012-08-20 MED ORDER — TAMSULOSIN HCL 0.4 MG PO CAPS: 0.4000 mg | ORAL_CAPSULE | ORAL | Status: DC

## 2012-08-20 MED ORDER — DIPHENHYDRAMINE HCL 25 MG PO CAPS
25.0000 mg | ORAL_CAPSULE | ORAL | Status: AC
  Administered 2012-08-20: 25 mg via ORAL
  Filled 2012-08-20: qty 1

## 2012-08-20 NOTE — Progress Notes (Signed)
Returned for [employer] truck assisted to Medical illustrator. Pt dozing but will awake and c/o pain then back to sleep. Right flank softball sized pink area and small blood blister in center intact

## 2012-08-20 NOTE — Progress Notes (Signed)
Patient took Cipro at home prior to arrival to Short Stay.

## 2012-08-20 NOTE — Op Note (Signed)
See [facility] OP note scanned into chart. 

## 2012-08-20 NOTE — H&P (Signed)
[name] has a history of nephrolithiasis.  He actually had micro hematuria last year, but his CT and cysto were negative for any malignancy.  It is likely the hematuria was related to bilateral stones.  [name] went to the ER on 08/10/12 with right flank pain.  A CT scan found a 6 mm stone at the right UPJ.  Several non obstructing stones were also seen bilaterally.  He was prescribed Trevor Ross to aid in MET of this stone.  Interval history: [name] presents today in follow up after a 6 mm right UPJ stone was found via CT on 08/10/12.  He continues to have intermittent right flank pain.  This is controlled with pain medication.  He does not have any associated n/v or fever. He has been taking Trevor Ross.  He has not seen a stone pass.   Past Medical History Problems  1. History of  Chronic Bacterial Prostatitis 601.1 2. History of  Dysuria 788.1 3. History of  Microscopic Hematuria 599.72 4. History of  Microscopic Hematuria 599.72 5. History of  Prostatitis 601.9  Surgical History Problems  1. History of  Appendectomy 2. History of  Arthroscopy Knee Right 3. History of  Knee Surgery 4. History of  Tonsillectomy  Current Meds 1. Aleve TABS; Therapy: (Recorded:30Aug2011) to 2. Cipro 500 MG Oral Tablet; Therapy: (Recorded:07Apr2014) to 3. Oxycodone-Acetaminophen 5-325 MG Oral Tablet; Therapy: 05Apr2014 to 4. Trevor Ross HCl 0.4 MG Oral Capsule; Therapy: 05Apr2014 to  Allergies Medication  1. No Known Drug Allergies  Family History Problems  1. Family history of  Family Health Status - Father's Age 3 2. Family history of  Family Health Status - Mother's Age 25 3. Paternal history of  Hematuria 4. Paternal history of  Nephrolithiasis 5. Paternal history of  Prostate Cancer V16.42 6. Paternal history of  Renal Disease  Social History Problems  1. Alcohol Use 4 per wk 2. Caffeine Use 2 per day 3. Former Smoker V15.82 smoked when he was 19; no longer uses tobacco 4.  Occupation: Presenter, broadcasting  Review of Systems Genitourinary, constitutional and gastrointestinal system(s) were reviewed and pertinent findings if present are noted.  Gastrointestinal: flank pain.    Vitals Vital Signs M  BMI Calculated: 29.12 BSA Calculated: 2.14 Height: 5 ft 11 in Weight: 208 lb  Blood Pressure: 144 / 87 Heart Rate: 68 07Apr2014 03:32PM  Height: 5 ft 11 in   Physical Exam General appearance: alert and appears stated age Head: Normocephalic, without obvious abnormality, atraumatic Eyes: conjunctivae/corneas clear. EOM's intact.  Oropharynx: moist mucous membranes Neck: supple, symmetrical, trachea midline Resp: normal respiratory effort Cardio: regular rate and rhythm Back: symmetric, no curvature. ROM normal. No CVA tenderness. GI: soft, non-tender; bowel sounds normal; no masses,  no organomegaly Extremities: extremities normal, atraumatic, no cyanosis or edema Skin: Skin color normal. No visible rashes or lesions Neurologic: Grossly normal     The following images/tracing/specimen were independently visualized: Marland Kitchen  KUB: 6 mm right proximal ureteral stone seen, small non obstructing calcifications seen within each kidney bilaterally, stable pelvic phleboliths right renal u/s: I have reviewed the films and report. No masses or lesions are seen.  A small stone is seen within the right kidney.  Mild fullness of the right renal pelvis is seen which may be associated with a parapelvic cyst. The PVR is 163 cc. Bladder is otherwise without abnormalities.  See study sheet for details and measurements.    Assessment Assessed As you   [name] is uncomfortable  and does not like the "impending doom" of the right ureteral stone.  He would like something done about it.  We discussed the risks and benefits of lithotrispy vs ureteroscopy today.  He is not partial one way or the other.  His stone is well visualized per KUB. In the meantime, he will continue to take  Trevor Ross because if the stone were to spontaneously pass he would not need either procedure at this time.   Plan  He is scheduled for lithotripsy of his right proximal ureteral stone.

## 2012-09-19 ENCOUNTER — Encounter: Payer: Self-pay | Admitting: Family Medicine

## 2012-09-19 NOTE — Progress Notes (Signed)
Wife call about Trevor Ross coming in for a shingles vaccine.  Will ask staff to call her back.  Okay for him to get the shot if he can get on the schedule. I would check with insurance first due to cost.

## 2012-09-20 ENCOUNTER — Telehealth: Payer: Self-pay | Admitting: Family Medicine

## 2012-09-20 MED ORDER — ZOSTER VACCINE LIVE 19400 UNT/0.65ML ~~LOC~~ SOLR: 0.6500 mL | Freq: Once | SUBCUTANEOUS | Status: DC

## 2012-09-20 NOTE — Telephone Encounter (Signed)
rx for zostavax sent.

## 2012-12-12 ENCOUNTER — Other Ambulatory Visit: Payer: Self-pay

## 2013-02-05 ENCOUNTER — Ambulatory Visit (INDEPENDENT_AMBULATORY_CARE_PROVIDER_SITE_OTHER): Payer: Medicare Other | Admitting: Internal Medicine

## 2013-02-05 ENCOUNTER — Encounter: Payer: Self-pay | Admitting: Internal Medicine

## 2013-02-05 ENCOUNTER — Ambulatory Visit: Payer: Medicare Other | Admitting: Family Medicine

## 2013-02-05 VITALS — BP 124/82 | HR 65 | Temp 97.8°F | Wt 212.0 lb

## 2013-02-05 DIAGNOSIS — E860 Dehydration: Secondary | ICD-10-CM

## 2013-02-05 DIAGNOSIS — H8110 Benign paroxysmal vertigo, unspecified ear: Secondary | ICD-10-CM

## 2013-02-05 MED ORDER — MECLIZINE HCL 50 MG PO TABS: 50.0000 mg | ORAL_TABLET | Freq: Three times a day (TID) | ORAL | Status: DC | PRN

## 2013-02-05 NOTE — Progress Notes (Signed)
Subjective:    Patient ID: Trevor Ross, male    DOB: 04/07/1946, 67 y.o.   MRN: [id number]  HPI  Pt presents to the clinic today with c/o vertigo. This started yesterday while working on a car. He got up quickly and immediately felt dizzy and nauseated. He denies chest pain, chest tightness or shortness of breath with this episode. He went inside and lay down. He did feel better after this. It seems to be worse with movement of his head. He has not taken anything OTC for this. He has been drinking some beer while he was working on cars. He reports that he does not drink enough water. He has had similar episodes in the past. He was diagnosed with BPPV in 2010 by Dr. Dayton Martes and treated with meclizine.  Review of Systems      Past Medical History  Diagnosis Date  . Allergy   . Diverticulosis of colon 09/15/2006  . Nasal polyps   . Nephrolithiasis     PSA per Dr. Aldean Ast  . Arthritis     hands    Current Outpatient Prescriptions  Medication Sig Dispense Refill  . ciprofloxacin (CIPRO) 500 MG tablet Take 1 tablet (500 mg total) by mouth 2 (two) times daily.  28 tablet  0  . oxyCODONE-acetaminophen (PERCOCET/ROXICET) 5-325 MG per tablet Take 2 tablets by mouth every 4 (four) hours as needed for pain.      . tamsulosin (FLOMAX) 0.4 MG CAPS Take 1 capsule (0.4 mg total) by mouth daily.  15 capsule  0  . tamsulosin (FLOMAX) 0.4 MG CAPS Take 1 capsule (0.4 mg total) by mouth daily after supper.  30 capsule  0  . zoster vaccine live, PF, (ZOSTAVAX) 82956 UNT/0.65ML injection Inject 19,400 Units into the skin once.  1 each  0   No current facility-administered medications for this visit.    No Known Allergies  History reviewed. No pertinent family history.  History   Social History  . Marital Status: Married    Spouse Name: N/A    Number of Children: 0  . Years of Education: N/A   Occupational History  . HVAC Mechanic    Social History Main Topics  . Smoking status: Never  Smoker   . Smokeless tobacco: Never Used  . Alcohol Use: Yes     Comment: up to 3 or 4 beers per week  . Drug Use: No  . Sexual Activity: Not on file   Other Topics Concern  . Not on file   Social History Narrative   1 step-daughter, 1 grandson     Constitutional: Denies fever, malaise, fatigue, headache or abrupt weight changes.  Respiratory: Denies difficulty breathing, shortness of breath, cough or sputum production.   Cardiovascular: Denies chest pain, chest tightness, palpitations or swelling in the hands or feet.  Gastrointestinal: Pt reports nausea. Denies abdominal pain, bloating, constipation, diarrhea or blood in the stool.  Neurological: Pt reports dizziness. Denies difficulty with memory, difficulty with speech or problems with balance and coordination.   No other specific complaints in a complete review of systems (except as listed in HPI above).  Objective:   Physical Exam   BP 124/82  Pulse 65  Temp(Src) 97.8 F (36.6 C) (Oral)  Wt 212 lb (96.163 kg)  BMI 29.58 kg/m2  SpO2 95% Wt Readings from Last 3 Encounters:  02/05/13 212 lb (96.163 kg)  08/20/12 204 lb 3.2 oz (92.625 kg)  08/20/12 204 lb 3.2 oz (92.625 kg)  General: Appears his stated age, well developed, well nourished in NAD.  Cardiovascular: Normal rate and rhythm. S1,S2 noted.  No murmur, rubs or gallops noted. No JVD or BLE edema. No carotid bruits noted. Pulmonary/Chest: Normal effort and positive vesicular breath sounds. No respiratory distress. No wheezes, rales or ronchi noted.  Abdomen: Soft and nontender. Normal bowel sounds, no bruits noted. No distention or masses noted. Liver, spleen and kidneys non palpable. Neurological: Alert and oriented. Cranial nerves II-XII intact. Coordination normal. +DTRs bilaterally.   BMET    Component Value Date/Time   NA 137 08/10/2012 1740   K 3.7 08/10/2012 1740   CL 103 08/10/2012 1740   CO2 24 08/10/2012 1740   GLUCOSE 128* 08/10/2012 1740   BUN 19  08/10/2012 1740   CREATININE 1.07 08/10/2012 1740   CALCIUM 9.1 08/10/2012 1740   GFRNONAA 71* 08/10/2012 1740   GFRAA 82* 08/10/2012 1740    Lipid Panel     Component Value Date/Time   CHOL 175 07/09/2008 0941   TRIG 60 07/09/2008 0941   HDL 43.0 07/09/2008 0941   CHOLHDL 4.1 CALC 07/09/2008 0941   VLDL 12 07/09/2008 0941   LDLCALC 120* 07/09/2008 0941    CBC    Component Value Date/Time   WBC 7.4 08/10/2012 1740   RBC 4.15* 08/10/2012 1740   HGB 13.2 08/10/2012 1740   HCT 38.0* 08/10/2012 1740   PLT 258 08/10/2012 1740   MCV 91.6 08/10/2012 1740   MCH 31.8 08/10/2012 1740   MCHC 34.7 08/10/2012 1740   RDW 12.5 08/10/2012 1740   LYMPHSABS 2.0 08/10/2012 1740   MONOABS 0.9 08/10/2012 1740   EOSABS 0.2 08/10/2012 1740   BASOSABS 0.0 08/10/2012 1740    Hgb A1C No results found for this basename: HGBA1C        Assessment & Plan:   Vertigo, likely secondary to BPPV and dehydration:  Drink plenty of water over the next 24 hours eRx for Meclizine  Monitor for chest pain, chest tightness, shortness of breath or syncope  RTC as needed or if symptoms persist or worsen

## 2013-02-05 NOTE — Patient Instructions (Signed)
Vertigo Vertigo means you feel like you or your surroundings are moving when they are not. Vertigo can be dangerous if it occurs when you are at work, driving, or performing difficult activities.  CAUSES  Vertigo occurs when there is a conflict of signals sent to your brain from the visual and sensory systems in your body. There are many different causes of vertigo, including:  Infections, especially in the inner ear.  A bad reaction to a drug or misuse of alcohol and medicines.  Withdrawal from drugs or alcohol.  Rapidly changing positions, such as lying down or rolling over in bed.  A migraine headache.  Decreased blood flow to the brain.  Increased pressure in the brain from a head injury, infection, tumor, or bleeding. SYMPTOMS  You may feel as though the world is spinning around or you are falling to the ground. Because your balance is upset, vertigo can cause nausea and vomiting. You may have involuntary eye movements (nystagmus). DIAGNOSIS  Vertigo is usually diagnosed by physical exam. If the cause of your vertigo is unknown, your caregiver may perform imaging tests, such as an MRI scan (magnetic resonance imaging). TREATMENT  Most cases of vertigo resolve on their own, without treatment. Depending on the cause, your caregiver may prescribe certain medicines. If your vertigo is related to body position issues, your caregiver may recommend movements or procedures to correct the problem. In rare cases, if your vertigo is caused by certain inner ear problems, you may need surgery. HOME CARE INSTRUCTIONS   Follow your caregiver's instructions.  Avoid driving.  Avoid operating heavy machinery.  Avoid performing any tasks that would be dangerous to you or others during a vertigo episode.  Tell your caregiver if you notice that certain medicines seem to be causing your vertigo. Some of the medicines used to treat vertigo episodes can actually make them worse in some people. SEEK  IMMEDIATE MEDICAL CARE IF:   Your medicines do not relieve your vertigo or are making it worse.  You develop problems with talking, walking, weakness, or using your arms, hands, or legs.  You develop severe headaches.  Your nausea or vomiting continues or gets worse.  You develop visual changes.  A family member notices behavioral changes.  Your condition gets worse. MAKE SURE YOU:  Understand these instructions.  Will watch your condition.  Will get help right away if you are not doing well or get worse. Document Released: 02/02/2005 Document Revised: 07/18/2011 Document Reviewed: 11/11/2010 [employer] Patient Information 2014 [employer].  

## 2013-02-18 ENCOUNTER — Encounter: Payer: Self-pay | Admitting: Family Medicine

## 2013-02-18 ENCOUNTER — Telehealth: Payer: Self-pay

## 2013-02-18 MED ORDER — LORAZEPAM 0.5 MG PO TABS: 0.5000 mg | ORAL_TABLET | Freq: Two times a day (BID) | ORAL | Status: DC | PRN

## 2013-02-18 NOTE — Telephone Encounter (Signed)
Spoke with patient and advised results rx called into pharmacy  

## 2013-02-18 NOTE — Telephone Encounter (Signed)
Please call in ativan and make sure he is doing bedside exercises.  Instructions printed for the patient if needed.  If not improved, we need to get him over th ENT.  Thanks.

## 2013-02-18 NOTE — Telephone Encounter (Signed)
Pt was seen on 02/05/13 for dizziness; pt is taking Meclizine 100 mg in Am and PM with minimal relief from dizziness; pt said can have dizziness at any time but worse when pt lays down or gets up from bed. Pt request different or stronger med to [place]. No h/a,CP,SOB.Pt request cb.

## 2013-03-01 ENCOUNTER — Encounter: Payer: Self-pay | Admitting: Family Medicine

## 2013-03-01 ENCOUNTER — Ambulatory Visit (INDEPENDENT_AMBULATORY_CARE_PROVIDER_SITE_OTHER): Payer: Medicare Other | Admitting: Family Medicine

## 2013-03-01 VITALS — BP 122/80 | HR 74 | Temp 98.1°F | Wt 214.5 lb

## 2013-03-01 DIAGNOSIS — H8110 Benign paroxysmal vertigo, unspecified ear: Secondary | ICD-10-CM

## 2013-03-01 NOTE — Patient Instructions (Signed)
Restart the meclizine and use the bedside exercise.  If not better, then call me. Take care.

## 2013-03-01 NOTE — Progress Notes (Signed)
Vertigo sx.  Still going on episodically, with position change, usually laying down in bed.  Antivert seems to help some.  Started when moving from laying to standing.  Happens with rolling over in bed but not with eye tracking.  Quick onset of sx.  Lasts a few seconds.  No ear ringing now.  No FCNAVD.  No hearing changes.  No new weakness or numbness in the ext.  Still swallowing well. No vision changes.    Meds, vitals, and allergies reviewed.   ROS: See HPI.  Otherwise, noncontributory.  GEN: nad, alert and oriented HEENT: mucous membranes moist NECK: supple w/o LA CV: rrr.  PULM: ctab, no inc wob ABD: soft, +bs EXT: no edema SKIN: no acute rash CN 2-12 wnl B, S/S/DTR wnl x4 DHP pos

## 2013-03-02 NOTE — Assessment & Plan Note (Signed)
Would use bedside exercise, continue prn meclizine.  Doesn't have to use BZD.  If not improved, we can refer to ENT.  He agrees. Path/phys d/w pt.  Nontoxic.  No other sign of an ominous dx.

## 2013-03-14 ENCOUNTER — Other Ambulatory Visit: Payer: Self-pay

## 2013-07-24 ENCOUNTER — Encounter: Payer: Self-pay | Admitting: Family Medicine

## 2014-02-06 ENCOUNTER — Ambulatory Visit (INDEPENDENT_AMBULATORY_CARE_PROVIDER_SITE_OTHER): Payer: Medicare Other | Admitting: Family Medicine

## 2014-02-06 ENCOUNTER — Encounter: Payer: Self-pay | Admitting: Family Medicine

## 2014-02-06 VITALS — BP 142/84 | HR 78 | Temp 98.7°F | Ht 71.0 in | Wt 213.1 lb

## 2014-02-06 DIAGNOSIS — J069 Acute upper respiratory infection, unspecified: Secondary | ICD-10-CM

## 2014-02-06 MED ORDER — BENZONATATE 200 MG PO CAPS: 200.0000 mg | ORAL_CAPSULE | Freq: Three times a day (TID) | ORAL | Status: DC | PRN

## 2014-02-06 MED ORDER — LIDOCAINE VISCOUS 2 % MT SOLN: 10.0000 mL | OROMUCOSAL | Status: DC | PRN

## 2014-02-06 NOTE — Progress Notes (Signed)
Pre visit review using our clinic review tool, if applicable. No additional management support is needed unless otherwise documented below in the visit note.  Sx started recently, ~2 days.  ST with swallowing. HA noted.  "I don't feel good."  Some sputum, early this AM.  Took mucinex.  Some cough, tickle in the throat.  No fevers.  No vomiting.  No diarrhea.  No rash.  No facial pain.    Meds, vitals, and allergies reviewed.   ROS: See HPI.  Otherwise, noncontributory.  GEN: nad, alert and oriented HEENT: mucous membranes moist, tm w/o erythema, nasal exam w/o erythema, clear discharge noted,  OP with cobblestoning, sinuses not ttp x4 NECK: supple w/o LA CV: rrr.   PULM: ctab, no inc wob EXT: no edema SKIN: no acute rash

## 2014-02-06 NOTE — Patient Instructions (Signed)
Use tessalon for the cough.  Lidocaine or salt water gargles for the throat irritation.  Take care.  Drink plenty of fluids.  Get some rest.  Glad to see you.

## 2014-02-07 DIAGNOSIS — J069 Acute upper respiratory infection, unspecified: Secondary | ICD-10-CM | POA: Insufficient documentation

## 2014-02-07 NOTE — Assessment & Plan Note (Signed)
Likely viral.  Nontoxic.Use tessalon for the cough. Lidocaine or salt water gargles for the throat irritation. Drink plenty of fluids. Get some rest.  F/u prn.  D/w pt.  He agrees.

## 2014-02-10 ENCOUNTER — Telehealth: Payer: Self-pay | Admitting: Family Medicine

## 2014-02-10 MED ORDER — AZITHROMYCIN 250 MG PO TABS: ORAL_TABLET | ORAL | Status: DC

## 2014-02-10 MED ORDER — GUAIFENESIN-CODEINE 100-10 MG/5ML PO SYRP: 5.0000 mL | ORAL_SOLUTION | Freq: Three times a day (TID) | ORAL | Status: DC | PRN

## 2014-02-10 NOTE — Telephone Encounter (Signed)
Rx called to pharmacy. Patient notified by telephone. 

## 2014-02-10 NOTE — Telephone Encounter (Signed)
Pt called stating he is not feeling any better. His cough and congestion is getting worse. He would like something called into [facility]. Pt cell [phone]

## 2014-02-10 NOTE — Telephone Encounter (Signed)
zmax sent, please call in cough syrup.  Thanks.

## 2014-08-05 ENCOUNTER — Telehealth: Payer: Self-pay | Admitting: *Deleted

## 2014-08-05 NOTE — Telephone Encounter (Signed)
Message left for patient to return call about flu vaccine.

## 2014-09-29 ENCOUNTER — Encounter: Payer: Self-pay | Admitting: Family Medicine

## 2014-09-29 ENCOUNTER — Ambulatory Visit (INDEPENDENT_AMBULATORY_CARE_PROVIDER_SITE_OTHER): Payer: Medicare Other | Admitting: Family Medicine

## 2014-09-29 VITALS — BP 118/78 | HR 68 | Temp 98.3°F | Wt 210.2 lb

## 2014-09-29 DIAGNOSIS — M545 Low back pain, unspecified: Secondary | ICD-10-CM

## 2014-09-29 DIAGNOSIS — M549 Dorsalgia, unspecified: Secondary | ICD-10-CM | POA: Diagnosis not present

## 2014-09-29 LAB — POCT URINALYSIS DIPSTICK
Bilirubin, UA: NEGATIVE
Glucose, UA: NEGATIVE
Ketones, UA: NEGATIVE
LEUKOCYTES UA: NEGATIVE
NITRITE UA: NEGATIVE
PH UA: 5.5
Protein, UA: NEGATIVE
RBC UA: NEGATIVE
UROBILINOGEN UA: 0.2

## 2014-09-29 MED ORDER — CYCLOBENZAPRINE HCL 10 MG PO TABS: 5.0000 mg | ORAL_TABLET | Freq: Three times a day (TID) | ORAL | Status: DC | PRN

## 2014-09-29 NOTE — Progress Notes (Signed)
Pre visit review using our clinic review tool, if applicable. No additional management support is needed unless otherwise documented below in the visit note.  Back pain started in the middle of the back last week.  Moved lower into the L lower back in the meantime.  H/o renal stones.  No burning with urination.  No blood urine now.  No FCNAVD.  No rash.  H/o old injury years ago at work per patient.  No radicular pain, no leg sx.  No weakness.  No arm or upper back sx.  No clear trigger last week, just his ordinary work.  No falls, no other trauma.  Aleve usually helps his back aches, but not this one.    Meds, vitals, and allergies reviewed.   ROS: See HPI.  Otherwise, noncontributory.  nad ncat rrr ctab Back w/o midline pain.  L lower back is sore, ie the source of pain, but not ttp.  Pain with extension and twisting but not with forward flexion.  S/S wnl BLE  U/a neg.  D/w pt.

## 2014-09-29 NOTE — Assessment & Plan Note (Signed)
Likely muscle strain, not likely to be discogenic.  D/w pt.  U/a neg, likely not a stone causing his sx.  D/w pt.  He agrees.  Try heat, otc nsaid with cautions, flexeril with sedation caution. Should improve.  F/u prn.  Imaging not indicated.

## 2014-09-29 NOTE — Patient Instructions (Signed)
Take 1-2 aleve up to twice a day with food.  Use the flexeril but be careful about getting drowsy.  Use heat.  This should get better.

## 2014-11-03 ENCOUNTER — Other Ambulatory Visit: Payer: Self-pay

## 2014-12-25 ENCOUNTER — Encounter: Payer: Self-pay | Admitting: Family Medicine

## 2014-12-25 ENCOUNTER — Ambulatory Visit (INDEPENDENT_AMBULATORY_CARE_PROVIDER_SITE_OTHER): Payer: Medicare Other | Admitting: Family Medicine

## 2014-12-25 VITALS — BP 130/80 | HR 59 | Temp 98.0°F | Ht 71.0 in | Wt 208.8 lb

## 2014-12-25 DIAGNOSIS — R109 Unspecified abdominal pain: Secondary | ICD-10-CM | POA: Diagnosis not present

## 2014-12-25 DIAGNOSIS — R3129 Other microscopic hematuria: Secondary | ICD-10-CM

## 2014-12-25 DIAGNOSIS — R300 Dysuria: Secondary | ICD-10-CM | POA: Diagnosis not present

## 2014-12-25 DIAGNOSIS — R312 Other microscopic hematuria: Secondary | ICD-10-CM | POA: Diagnosis not present

## 2014-12-25 LAB — POCT URINALYSIS DIPSTICK
Bilirubin, UA: NEGATIVE
Glucose, UA: NEGATIVE
Ketones, UA: NEGATIVE
Leukocytes, UA: NEGATIVE
Nitrite, UA: NEGATIVE
Urobilinogen, UA: 0.2
pH, UA: 6

## 2014-12-25 LAB — POCT UA - MICROSCOPIC ONLY
CASTS, UR, LPF, POC: NEGATIVE
YEAST UA: NEGATIVE

## 2014-12-25 MED ORDER — TAMSULOSIN HCL 0.4 MG PO CAPS: 0.4000 mg | ORAL_CAPSULE | Freq: Every day | ORAL | Status: DC

## 2014-12-25 MED ORDER — SULFAMETHOXAZOLE-TRIMETHOPRIM 400-80 MG PO TABS: 1.0000 | ORAL_TABLET | Freq: Two times a day (BID) | ORAL | Status: DC

## 2014-12-25 NOTE — Progress Notes (Signed)
Dr. Frederico Hamman T. Sonakshi Rolland, MD, [facility] [place], [address] Phone: [phone] Fax: [phone]  12/25/2014  Patient: Trevor Ross, MRN: [id number], DOB: 03-Oct-1945, 69 y.o.  Primary Physician:  Elsie Stain, MD  Chief Complaint: Flank Pain  Subjective:   Trevor Ross is a 69 y.o. very pleasant male patient who presents with the following:  The patient has a history of nephrolithiasis as well as history of urinary tract infection in the past.  He presents today with some dysuria that has been worsening over the last few days.  He also has some urgency.  He does not have any fever.  He does have some occasional flank pain and back pain.  He is not having any doubling over severe pains currently.  There is no microscopic hematuria.  Past Medical History, Surgical History, Social History, Family History, Problem List, Medications, and Allergies have been reviewed and updated if relevant.  Patient Active Problem List   Diagnosis Date Noted  . Low back pain 09/29/2014  . URI (upper respiratory infection) 02/07/2014  . Nephrolithiasis 04/28/2012  . Family circumstance 11/28/2011  . BENIGN POSITIONAL VERTIGO 02/13/2009  . ALLERGIC RHINITIS 09/24/2007  . DIVERTICULOSIS, COLON 09/15/2006    Past Medical History  Diagnosis Date  . Allergy   . Diverticulosis of colon 09/15/2006  . Nasal polyps   . Nephrolithiasis     PSA per Dr. Serita Butcher  . Arthritis     hands    Past Surgical History  Procedure Laterality Date  . Appendectomy  1988  . Left knee repair  1970's  . Sinus surgery for polyps    . Right knee surgery      arthroscopic    Social History   Social History  . Marital Status: Married    Spouse Name: N/A  . Number of Children: 0  . Years of Education: N/A   Occupational History  . HVAC Mechanic    Social History Main Topics  . Smoking status: Never Smoker   . Smokeless tobacco: Never Used    . Alcohol Use: 0.0 oz/week    0 Standard drinks or equivalent per week     Comment: up to 3 or 4 beers per week  . Drug Use: No  . Sexual Activity: Not on file   Other Topics Concern  . Not on file   Social History Narrative   1 step-daughter, 1 grandson    No family history on file.  No Known Allergies  Medication list reviewed and updated in full in [facility].   GEN: No acute illnesses, no fevers, chills. GI: No n/v/d, eating normally Pulm: No SOB Interactive and getting along well at home.  Otherwise, ROS is as per the HPI.  Objective:   BP 130/80 mmHg  Pulse 59  Temp(Src) 98 F (36.7 C) (Oral)  Ht 5\' 11"  (1.803 m)  Wt 208 lb 12 oz (94.688 kg)  BMI 29.13 kg/m2  GEN: WDWN, NAD, Non-toxic, A & O x 3 HEENT: Atraumatic, Normocephalic. Neck supple. No masses, No LAD. Ears and Nose: No external deformity. CV: RRR, No M/G/R. No JVD. No thrill. No extra heart sounds. PULM: CTA B, no wheezes, crackles, rhonchi. No retractions. No resp. distress. No accessory muscle use. ABD: S, NT, ND, + BS, No rebound, No HSM. No CVAT EXTR: No c/c/e NEURO Normal gait.  PSYCH: Normally interactive. Conversant. Not depressed or anxious appearing.  Calm demeanor.  Laboratory and Imaging Data: Results for orders placed or performed in visit on 12/25/14  POCT urinalysis dipstick  Result Value Ref Range   Color, UA yellow    Clarity, UA clear    Glucose, UA negative    Bilirubin, UA negative    Ketones, UA negative    Spec Grav, UA >=1.030    Blood, UA large    pH, UA 6.0    Protein, UA trace    Urobilinogen, UA 0.2    Nitrite, UA negative    Leukocytes, UA Negative Negative  POCT UA - Microscopic Only  Result Value Ref Range   WBC, Ur, HPF, POC 0-3    RBC, urine, microscopic 20+    Bacteria, U Microscopic trace    Mucus, UA trace    Epithelial cells, urine per micros 0-3    Crystals, Ur, HPF, POC few calcium oxalate    Casts, Ur, LPF, POC negative    Yeast, UA  negative      Assessment and Plan:   Dysuria  Flank pain - Plan: POCT urinalysis dipstick, POCT UA - Microscopic Only, Urine culture  Microscopic hematuria  Significant red blood cells.  Given symptoms, we'll go ahead and start antibiotics and culture.  History is more suggestive of stone, so we will go ahead and have him start some Flomax and push fluids very intensely.  If he is not improving by Monday and recommended reevaluation and further imaging and diagnostic studies.   New Prescriptions   SULFAMETHOXAZOLE-TRIMETHOPRIM (BACTRIM,SEPTRA) 400-80 MG PER TABLET    Take 1 tablet by mouth 2 (two) times daily.   TAMSULOSIN (FLOMAX) 0.4 MG CAPS CAPSULE    Take 1 capsule (0.4 mg total) by mouth daily.   Orders Placed This Encounter  Procedures  . Urine culture  . POCT urinalysis dipstick  . POCT UA - Microscopic Only    Signed,  Darshan Solanki T. Nicklas Mcsweeney, MD   Patient's Medications  New Prescriptions   SULFAMETHOXAZOLE-TRIMETHOPRIM (BACTRIM,SEPTRA) 400-80 MG PER TABLET    Take 1 tablet by mouth 2 (two) times daily.   TAMSULOSIN (FLOMAX) 0.4 MG CAPS CAPSULE    Take 1 capsule (0.4 mg total) by mouth daily.  Previous Medications   NAPROXEN SODIUM (ANAPROX) 220 MG TABLET    Take 220 mg by mouth as needed.  Modified Medications   No medications on file  Discontinued Medications   CYCLOBENZAPRINE (FLEXERIL) 10 MG TABLET    Take 0.5-1 tablets (5-10 mg total) by mouth 3 (three) times daily as needed for muscle spasms (sedation caution).

## 2014-12-25 NOTE — Progress Notes (Signed)
Pre visit review using our clinic review tool, if applicable. No additional management support is needed unless otherwise documented below in the visit note. 

## 2014-12-26 LAB — URINE CULTURE
Colony Count: NO GROWTH
ORGANISM ID, BACTERIA: NO GROWTH

## 2015-01-20 ENCOUNTER — Other Ambulatory Visit: Payer: Self-pay | Admitting: Family Medicine

## 2015-01-20 NOTE — Telephone Encounter (Signed)
Left message for Trevor Ross to return my call.

## 2015-01-20 NOTE — Telephone Encounter (Signed)
Spoke with Trevor Ross.  He states he passed the stone the next day after he saw Dr. Lorelei Pont.  He did not request a refill.

## 2015-01-20 NOTE — Telephone Encounter (Signed)
Last office visit 12/25/2014.  Last prescribed 12/25/2014 for #30 with no refills.  Refill?

## 2015-01-20 NOTE — Telephone Encounter (Signed)
Trevor Ross, can you check on patient. This medication was not intended to be taken long term.   If he is not better, then he should be rechecked to see if he has a stone.

## 2015-04-08 ENCOUNTER — Ambulatory Visit: Payer: Medicare Other | Admitting: Family Medicine

## 2015-04-21 DIAGNOSIS — S82035D Nondisplaced transverse fracture of left patella, subsequent encounter for closed fracture with routine healing: Secondary | ICD-10-CM | POA: Insufficient documentation

## 2016-06-03 DIAGNOSIS — H52203 Unspecified astigmatism, bilateral: Secondary | ICD-10-CM | POA: Diagnosis not present

## 2016-06-03 DIAGNOSIS — Z961 Presence of intraocular lens: Secondary | ICD-10-CM | POA: Diagnosis not present

## 2016-06-15 DIAGNOSIS — N400 Enlarged prostate without lower urinary tract symptoms: Secondary | ICD-10-CM | POA: Diagnosis not present

## 2016-06-15 DIAGNOSIS — N5201 Erectile dysfunction due to arterial insufficiency: Secondary | ICD-10-CM | POA: Diagnosis not present

## 2016-08-04 ENCOUNTER — Encounter: Payer: Self-pay | Admitting: Gastroenterology

## 2016-09-13 ENCOUNTER — Encounter: Payer: Self-pay | Admitting: Gastroenterology

## 2016-10-21 DIAGNOSIS — L821 Other seborrheic keratosis: Secondary | ICD-10-CM | POA: Diagnosis not present

## 2016-10-21 DIAGNOSIS — L918 Other hypertrophic disorders of the skin: Secondary | ICD-10-CM | POA: Diagnosis not present

## 2016-10-21 DIAGNOSIS — L720 Epidermal cyst: Secondary | ICD-10-CM | POA: Diagnosis not present

## 2016-10-21 DIAGNOSIS — L814 Other melanin hyperpigmentation: Secondary | ICD-10-CM | POA: Diagnosis not present

## 2016-11-01 ENCOUNTER — Ambulatory Visit (AMBULATORY_SURGERY_CENTER): Payer: Self-pay

## 2016-11-01 ENCOUNTER — Telehealth: Payer: Self-pay | Admitting: Family Medicine

## 2016-11-01 VITALS — Ht 71.0 in | Wt 209.6 lb

## 2016-11-01 DIAGNOSIS — Z1211 Encounter for screening for malignant neoplasm of colon: Secondary | ICD-10-CM

## 2016-11-01 NOTE — Progress Notes (Signed)
Per pt, no allergies to soy or egg products.Pt not taking any weight loss meds or using  O2 at home.   Pt refused Emmi video.  Pt came into the office today with his wife, for his PV prior to his colon on 11/15/16 with Dr Ardis Hughs. After reviewing medical history with pt, pt stated he is having problems with choking on food and it is getting worse over the last month. He was planning on scheduling an appt.to see Dr Damita Dunnings regarding this issue. After discussing symptoms with pt and wife, pt decided to call Dr Josefine Class office for an appointment and will call us back later to reschedule a possible endo/colon. Colon on 11/15/16 was cx'd per pt's request.

## 2016-11-01 NOTE — Telephone Encounter (Signed)
Pt dropped off records from [facility]. Placed on cart.

## 2016-11-02 ENCOUNTER — Encounter: Payer: Self-pay | Admitting: Family Medicine

## 2016-11-02 NOTE — Telephone Encounter (Signed)
Thanks

## 2016-11-02 NOTE — Telephone Encounter (Signed)
It was still on the cart. I placed it in your inbox.

## 2016-11-02 NOTE — Telephone Encounter (Signed)
I don't have this record yet.  Please let me know where it is. Thanks.

## 2016-11-03 ENCOUNTER — Encounter: Payer: Self-pay | Admitting: Family Medicine

## 2016-11-03 NOTE — Telephone Encounter (Signed)
Patient apparently is having trouble swallowing as well.

## 2016-11-04 ENCOUNTER — Telehealth: Payer: Self-pay

## 2016-11-04 NOTE — Telephone Encounter (Signed)
-----   Message from Milus Banister, MD sent at 11/04/2016 10:14 AM EDT ----- It's probably best that I see him in the office first. We'll reach out to him to schedule.  Thanks  Reann Dobias, He needs [place] office visit with me, first available for dysphagia and colon cancer screening.  Thanks   ----- Message ----- From: Tonia Ghent, MD Sent: 11/03/2016   2:02 PM To: Milus Banister, MD  He had some significant dysphagia.  Can he come straight to you for this?  I haven't seen him for it yet but I would very likely send him straight to you.  He is going to schedule colonoscopy in the near future.  Can EGD be done at the same time?  Brigitte Pulse

## 2016-11-04 NOTE — Telephone Encounter (Signed)
Left message on machine to call back  

## 2016-11-04 NOTE — Telephone Encounter (Signed)
Pt has been scheduled for office visit to discuss procedures

## 2016-11-15 ENCOUNTER — Encounter: Payer: Medicare Other | Admitting: Gastroenterology

## 2016-11-28 ENCOUNTER — Ambulatory Visit (INDEPENDENT_AMBULATORY_CARE_PROVIDER_SITE_OTHER)
Admission: RE | Admit: 2016-11-28 | Discharge: 2016-11-28 | Disposition: A | Discharge status: Home / Self Care | Payer: PPO | Source: Ambulatory Visit | Attending: Family Medicine | Admitting: Family Medicine

## 2016-11-28 ENCOUNTER — Ambulatory Visit (INDEPENDENT_AMBULATORY_CARE_PROVIDER_SITE_OTHER): Payer: PPO | Admitting: Family Medicine

## 2016-11-28 ENCOUNTER — Encounter: Payer: Self-pay | Admitting: Family Medicine

## 2016-11-28 VITALS — BP 124/76 | HR 68 | Temp 98.2°F | Wt 207.0 lb

## 2016-11-28 DIAGNOSIS — R05 Cough: Secondary | ICD-10-CM | POA: Diagnosis not present

## 2016-11-28 DIAGNOSIS — R059 Cough, unspecified: Secondary | ICD-10-CM

## 2016-11-28 MED ORDER — BENZONATATE 200 MG PO CAPS: 200.0000 mg | ORAL_CAPSULE | Freq: Three times a day (TID) | ORAL | 0 refills | Status: DC | PRN

## 2016-11-28 NOTE — Assessment & Plan Note (Signed)
Ongoing but ctab.  D/w pt.  Check cxr and use tessalon as needed.  Nontoxic, okay for outpatient f/u.  He agrees.  See AVS.

## 2016-11-28 NOTE — Progress Notes (Signed)
Sx started about 2 weeks ago.  Initially with cough, dry in the meantime.  Then had HA and chest wall was sore from coughing.  No wheeze.  "Dizzy sensation" but no vertigo, just "like I need to lay down."  No fevers now but did have fever a chills a few days ago.  No vomiting, no diarrhea.  No rash.  Cough is slowly getting better but he is still persistently fatigued.  He has been working hard in the yard and not able to tolerate that was much as usual.  He recently started taking mucinex.  He can usually tolerate that medicine.  He admitted to not drinking enough fluid.  No wheeze.  The cough is some better in the meantime.    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  That was a limited exposure, ie not at their house.    He usually doesn't have summertime allergies.    Meds, vitals, and allergies reviewed.   ROS: Per HPI unless specifically indicated in ROS section   GEN: nad, alert and oriented HEENT: mucous membranes moist, TM w/o erythema but R canal with some wax, nasal exam wnl, OP with minimal cobblestoning.  NECK: supple w/o LA CV: rrr.  no murmur PULM: ctab, no inc wob ABD: soft, +bs EXT: no edema SKIN: no acute rash

## 2016-11-28 NOTE — Patient Instructions (Signed)
Go to the lab on the way out.  We'll contact you with your xray report. Increase your fluids in the meantime and update me in a few days, if not better or if worse in the meantime.  Take care.  Glad to see you.

## 2016-11-30 ENCOUNTER — Telehealth: Payer: Self-pay

## 2016-11-30 MED ORDER — MECLIZINE HCL 25 MG PO TABS: 12.5000 mg | ORAL_TABLET | Freq: Three times a day (TID) | ORAL | 0 refills | Status: DC | PRN

## 2016-11-30 NOTE — Telephone Encounter (Signed)
Trevor Ross (DPR signed) left v/m; pt seen 11/28/16 with a cough; the cough is better but today when pt stands he feels dizzy and nauseated. Becky request med for nausea and lightheadedness sent to [facility] drug. Becky request cb.

## 2016-11-30 NOTE — Telephone Encounter (Signed)
Meclizine as needed, rx sent.  Push fluids in the meantime and rise slowly.  Recheck if not getting better.  Thanks.

## 2016-11-30 NOTE — Telephone Encounter (Signed)
Patient's wife notified as instructed by telephone and verbalized understanding. 

## 2016-12-06 ENCOUNTER — Encounter: Payer: Self-pay | Admitting: Family Medicine

## 2016-12-06 ENCOUNTER — Ambulatory Visit (INDEPENDENT_AMBULATORY_CARE_PROVIDER_SITE_OTHER): Payer: PPO | Admitting: Family Medicine

## 2016-12-06 ENCOUNTER — Telehealth: Payer: Self-pay

## 2016-12-06 VITALS — BP 110/76 | HR 63 | Temp 98.3°F | Wt 208.5 lb

## 2016-12-06 DIAGNOSIS — R55 Syncope and collapse: Secondary | ICD-10-CM | POA: Diagnosis not present

## 2016-12-06 DIAGNOSIS — R059 Cough, unspecified: Secondary | ICD-10-CM

## 2016-12-06 DIAGNOSIS — R05 Cough: Secondary | ICD-10-CM | POA: Diagnosis not present

## 2016-12-06 DIAGNOSIS — R0683 Snoring: Secondary | ICD-10-CM

## 2016-12-06 MED ORDER — OMEPRAZOLE 20 MG PO CPDR: 20.0000 mg | DELAYED_RELEASE_CAPSULE | Freq: Every day | ORAL | 3 refills | Status: DC

## 2016-12-06 NOTE — Patient Instructions (Signed)
Start taking prilosec and Rosaria Ferries will call about your referral. Take care.  Glad to see you.

## 2016-12-06 NOTE — Telephone Encounter (Signed)
Pt was seen earlier today and pt forgot to ask if Dr Damita Dunnings wants pt to continue taking or stop meclizine and benzonatate. Pt request cb.

## 2016-12-06 NOTE — Telephone Encounter (Signed)
Patient advised.

## 2016-12-06 NOTE — Telephone Encounter (Signed)
Stop benzonatate; continue meclizine prn. Thanks.

## 2016-12-06 NOTE — Progress Notes (Signed)
Tessalon didn't help with cough.   Last OV note d/w pt, prev CXR w/o acute changes.  He is still coughing.  Worse when up and moving around.  No sputum.  No fevers but occ chills.  He is drinking more water in the meantime.  No wheeze noted by patient.  Cough has been going on for about 1 month.  Some heartburn, "more often than not" but not exertional.    He felt presyncopal after working in the yards and cutting grass.  this was a few days ago.  Was outside for about 3 hours, in the heat, in the middle of the day.  He feels back to baseline now.    Posterior neck is sore with ROM but he had gotten a new pillow recently.    He had a tick bite a few months ago, with local irritation and itching, fully removed and it wasn't engorged.  No rash.    Possible OSA with severe snoring and pauses noted.  D/w pt and wife.    Meds, vitals, and allergies reviewed.   ROS: Per HPI unless specifically indicated in ROS section   GEN: nad, alert and oriented HEENT: mucous membranes moist NECK: supple w/o LA CV: rrr.  no murmur PULM: ctab, no inc wob ABD: soft, +bs EXT: no edema SKIN: no acute rash

## 2016-12-07 DIAGNOSIS — R55 Syncope and collapse: Secondary | ICD-10-CM | POA: Insufficient documentation

## 2016-12-07 DIAGNOSIS — R0683 Snoring: Secondary | ICD-10-CM | POA: Insufficient documentation

## 2016-12-07 NOTE — Assessment & Plan Note (Signed)
Chronic cough, differential discussed with patient. Possible causes typically include asthma, COPD, ACE inhibitor use, postnasal drip, GERD. He is not on ACE inhibitor. He does have some heartburn. This could be contributing. Start Prilosec. We are going to refer him to pulmonary anyway. No sign of ominous diagnosis at this point. >25 minutes spent in face to face time with patient, >50% spent in counselling or coordination of care.

## 2016-12-07 NOTE — Assessment & Plan Note (Signed)
Concern for sleep apnea. Discussed with patient. We will going to refer him to pulmonary for this, even if his cough is resolved in the meantime. Sleep apnea pathophysiology discussed patient.

## 2016-12-07 NOTE — Assessment & Plan Note (Signed)
This was when he was almost certainly dehydrated after working in the heat for 3 hours in the middle of the day recently. Back to baseline now. Encouraged patient to use reasonable judgment about his work outside.

## 2017-01-03 ENCOUNTER — Ambulatory Visit: Payer: PPO | Admitting: Gastroenterology

## 2017-01-11 ENCOUNTER — Encounter: Payer: Self-pay | Admitting: Gastroenterology

## 2017-01-11 ENCOUNTER — Ambulatory Visit (INDEPENDENT_AMBULATORY_CARE_PROVIDER_SITE_OTHER): Payer: PPO | Admitting: Gastroenterology

## 2017-01-11 VITALS — BP 136/82 | HR 72 | Ht 71.0 in | Wt 210.8 lb

## 2017-01-11 DIAGNOSIS — R1310 Dysphagia, unspecified: Secondary | ICD-10-CM

## 2017-01-11 DIAGNOSIS — Z1211 Encounter for screening for malignant neoplasm of colon: Secondary | ICD-10-CM

## 2017-01-11 MED ORDER — NA SULFATE-K SULFATE-MG SULF 17.5-3.13-1.6 GM/177ML PO SOLN: 1.0000 | Freq: Once | ORAL | 0 refills | Status: AC

## 2017-01-11 NOTE — Patient Instructions (Addendum)
You will be set up for a colonoscopy for colon cancer screening.  You will be set up for an upper endoscopy for dysphagia.  Stay on omeprazole once daily for now.  Chew your food well, eat slowly and take small bites.  Normal BMI (Body Mass Index- based on height and weight) is between 23 and 30. Your BMI today is Body mass index is 29.4 kg/m. Marland Kitchen Please consider follow up  regarding your BMI with your Primary Care Provider.

## 2017-01-11 NOTE — Progress Notes (Signed)
HPI: This is a   very pleasant 71 year old man who was referred to me by Tonia Ghent, MD  to evaluate  dysphasia, routine risk for colon cancer .    Chief complaint is  intermittent solid food dysphagia, routine risk for colon cancer  He was having difficulty with dysphagia for 4-5 years, gradually getting worse.   The dysphagia was to solids only. Gradually over many years became worse.  Would occur  2-3 times per week at least He saw his PCP and started omeprazole for 3 week, one pill once daily.  Since starting the ppi he's had no swallowing issues since then.    Rare heartburn.  Takes tums periodically.  Overall stable weight, his wife thinks his weight is slowly increasing  Careful about chewing. no overt GI bleeding. No abdominal pains, no change in bowels.  Old Data Reviewed: Colonoscopy for routine risk screening May 2008 showed diverticulosis, no polyps. He was recommended to have repeat colon cancer screening with colonoscopy at 10 year interval.    Review of systems: Pertinent positive and negative review of systems were noted in the above HPI section. All other review negative.   Past Medical History:  Diagnosis Date  . Allergy   . Arthritis    hands  . Diverticulosis of colon 09/15/2006  . Dysphagia    per pt, having difficulty over last few months  . Hiatal hernia   . Nasal polyps   . Nephrolithiasis    PSA per Dr. Serita Butcher    Past Surgical History:  Procedure Laterality Date  . APPENDECTOMY  1988  . Left knee repair  1970's  . LITHOTRIPSY     twice per pt  . right knee surgery     arthroscopic  . Sinus surgery for polyps      Current Outpatient Prescriptions  Medication Sig Dispense Refill  . multivitamin-lutein (OCUVITE-LUTEIN) CAPS capsule Take 1 capsule by mouth daily.    Marland Kitchen omeprazole (PRILOSEC) 20 MG capsule Take 1 capsule (20 mg total) by mouth daily. 30 capsule 3  . sildenafil (REVATIO) 20 MG tablet 20 mg. Take 2 tablets by mouth as  needed.     No current facility-administered medications for this visit.     Allergies as of 01/11/2017  . (No Known Allergies)    Family History  Problem Relation Age of Onset  . Heart disease Mother   . Heart disease Father     Social History   Social History  . Marital status: Married    Spouse name: N/A  . Number of children: 0  . Years of education: N/A   Occupational History  . HVAC Mechanic    Social History Main Topics  . Smoking status: Never Smoker  . Smokeless tobacco: Never Used  . Alcohol use 0.0 oz/week     Comment: up to 3 or 4 beers per week  . Drug use: No  . Sexual activity: Not on file   Other Topics Concern  . Not on file   Social History Narrative   1 step-daughter, 1 grandson     Physical Exam: BP 136/82   Pulse 72   Ht 5\' 11"  (1.803 m)   Wt 210 lb 12.8 oz (95.6 kg)   BMI 29.40 kg/m  Constitutional: generally well-appearing Psychiatric: alert and oriented x3 Eyes: extraocular movements intact Mouth: oral pharynx moist, no lesions Neck: supple no lymphadenopathy Cardiovascular: heart regular rate and rhythm Lungs: clear to auscultation bilaterally Abdomen: soft, nontender, nondistended, no  obvious ascites, no peritoneal signs, normal bowel sounds Extremities: no lower extremity edema bilaterally Skin: no lesions on visible extremities   Assessment and plan: 71 y.o. male with  Intermittent solid food dysphagia likely GERD related, routine risk for colon cancer he is due for colon cancer screening since his last screening test was about 10 years ago. We will arrange for colonoscopy at his soonest convenience. At the same time I recommended upper endoscopy given his intermittent solid food dysphagia. Since starting omeprazole 20 mg once daily his dysphagia has significantly improved but he still tells me he has to be very careful about chewing well, eating slowly. I think it is unlikely he has neoplasm. More likely this is peptic  related, perhaps a minor stricture or GERD related edema.   Please see the "Patient Instructions" section for addition details about the plan.   Owens Loffler, MD [facility] 01/11/2017, 10:45 AM  Cc: Tonia Ghent, MD

## 2017-01-12 ENCOUNTER — Ambulatory Visit (INDEPENDENT_AMBULATORY_CARE_PROVIDER_SITE_OTHER): Payer: PPO | Admitting: Pulmonary Disease

## 2017-01-12 ENCOUNTER — Encounter: Payer: Self-pay | Admitting: Pulmonary Disease

## 2017-01-12 VITALS — BP 122/86 | HR 62 | Ht 71.0 in | Wt 211.0 lb

## 2017-01-12 DIAGNOSIS — R0683 Snoring: Secondary | ICD-10-CM | POA: Diagnosis not present

## 2017-01-12 DIAGNOSIS — G4719 Other hypersomnia: Secondary | ICD-10-CM

## 2017-01-12 DIAGNOSIS — G4700 Insomnia, unspecified: Secondary | ICD-10-CM

## 2017-01-12 NOTE — Patient Instructions (Signed)
Split-night sleep study has been ordered After results are available, we will contact you with therapies and follow-up

## 2017-01-13 NOTE — Progress Notes (Signed)
PULMONARY/SLEEP CONSULT NOTE  Requesting MD/Service: Elsie Stain, M.D. Date of initial consultation: 01/12/17 Reason for consultation: Suspected sleep apnea  PT PROFILE: 71 y.o. male never smoker referred for evaluation of heavy snoring of many years duration with occasional witnessed apneas  HPI:  As above. He also reports mild to moderate daytime hypersomnolence. His Epworth sleepiness scale score on this day is 12. He is retired and frequently during the daytime. When he awakens, he feels reasonably well refreshed. He denies morning headache. He has no history of hypertension. He does have erectile dysfunction. He's had minimal change in his weight over the past decade.  Past Medical History:  Diagnosis Date  . Allergy   . Arthritis    hands  . Diverticulosis of colon 09/15/2006  . Dysphagia    per pt, having difficulty over last few months  . Hiatal hernia   . Nasal polyps   . Nephrolithiasis    PSA per Dr. Serita Butcher    Past Surgical History:  Procedure Laterality Date  . APPENDECTOMY  1988  . Left knee repair  1970's  . LITHOTRIPSY     twice per pt  . right knee surgery     arthroscopic  . Sinus surgery for polyps      MEDICATIONS: I have reviewed all medications and confirmed regimen as documented  Social History   Social History  . Marital status: Married    Spouse name: N/A  . Number of children: 0  . Years of education: N/A   Occupational History  . HVAC Mechanic    Social History Main Topics  . Smoking status: Never Smoker  . Smokeless tobacco: Never Used  . Alcohol use 0.0 oz/week     Comment: up to 3 or 4 beers per week  . Drug use: No  . Sexual activity: Not on file   Other Topics Concern  . Not on file   Social History Narrative   1 step-daughter, 1 grandson    Family History  Problem Relation Age of Onset  . Heart disease Mother   . Heart disease Father     ROS: Positive for occasional nonproductive cough and dysphagia which she  attributes to his hiatal hernia  No fever, myalgias/arthralgias, unexplained weight loss or weight gain No new focal weakness or sensory deficits No otalgia, hearing loss, visual changes, nasal and sinus symptoms, mouth and throat problems No neck pain or adenopathy No abdominal pain, N/V/D, diarrhea, change in bowel pattern No dysuria, change in urinary pattern   Vitals:   01/12/17 0927  BP: 122/86  Pulse: 62  SpO2: 98%  Weight: 95.7 kg (211 lb)  Height: 5\' 11"  (1.803 m)     EXAM:  Gen: WDWN, No overt respiratory distress HEENT: NCAT, sclera white, oropharynx normal Neck: Supple without LAN, thyromegaly, JVD Lungs: breath sounds Full, percussion normal, adventitious sounds: None Cardiovascular: RRR, no murmurs noted Abdomen: Soft, nontender, normal BS Ext: without clubbing, cyanosis, edema Neuro: CNs grossly intact, motor and sensory intact Skin: Limited exam, no lesions noted  DATA:   BMP Latest Ref Rng & Units 08/10/2012 03/13/2012 07/09/2008  Glucose 70 - 99 mg/dL 128(H) 92 100(H)  BUN 6 - 23 mg/dL 19 18 14   Creatinine 0.50 - 1.35 mg/dL 1.07 1.2 1.0  Sodium 135 - 145 mEq/L 137 137 139  Potassium 3.5 - 5.1 mEq/L 3.7 4.1 4.7  Chloride 96 - 112 mEq/L 103 102 105  CO2 19 - 32 mEq/L 24 27 30   Calcium 8.4 -  10.5 mg/dL 9.1 9.2 9.2    CBC Latest Ref Rng & Units 08/10/2012 03/13/2012  WBC 4.0 - 10.5 K/uL 7.4 10.7(H)  Hemoglobin 13.0 - 17.0 g/dL 13.2 13.6  Hematocrit 39.0 - 52.0 % 38.0(L) 42.3  Platelets 150 - 400 K/uL 258 394.0    CXR(11/28/16):  No acute cardiac or pulmonary findings  IMPRESSION:     ICD-10-CM   1. Snoring R06.83 Split night study  2. Daytime hypersomnolence G47.19 Split night study  3. Insomnia, unspecified type G47.00     PLAN:  He is not a candidate for home sleep study due to his payer Split-night sleep study has been ordered After results are interpreted, we will contact him for initiation of therapy and appropriate follow-up   Merton Border, MD PCCM service Mobile [phone] Pager [phone] 01/13/2017 3:51 PM

## 2017-02-01 ENCOUNTER — Encounter: Payer: Self-pay | Admitting: Gastroenterology

## 2017-02-03 ENCOUNTER — Telehealth: Payer: Self-pay | Admitting: *Deleted

## 2017-02-03 DIAGNOSIS — G4719 Other hypersomnia: Secondary | ICD-10-CM

## 2017-02-03 NOTE — Telephone Encounter (Signed)
-----   Message from Wilhelmina Mcardle, MD sent at 02/03/2017 10:26 AM EDT ----- Please schedule as a home sleep study. A physician from his insurance assures me that it will be approved and they would prefer that it be a home study  [place]

## 2017-02-03 NOTE — Telephone Encounter (Signed)
HST ordered per DS' message. Nothing further needed.

## 2017-02-05 ENCOUNTER — Other Ambulatory Visit: Payer: Self-pay | Admitting: Family Medicine

## 2017-02-05 DIAGNOSIS — Z1159 Encounter for screening for other viral diseases: Secondary | ICD-10-CM

## 2017-02-05 DIAGNOSIS — Z13220 Encounter for screening for lipoid disorders: Secondary | ICD-10-CM

## 2017-02-05 DIAGNOSIS — N200 Calculus of kidney: Secondary | ICD-10-CM

## 2017-02-07 ENCOUNTER — Ambulatory Visit: Payer: PPO

## 2017-02-07 ENCOUNTER — Ambulatory Visit (INDEPENDENT_AMBULATORY_CARE_PROVIDER_SITE_OTHER): Payer: PPO

## 2017-02-07 VITALS — BP 130/82 | HR 63 | Temp 97.9°F | Ht 68.75 in | Wt 209.0 lb

## 2017-02-07 DIAGNOSIS — Z1159 Encounter for screening for other viral diseases: Secondary | ICD-10-CM

## 2017-02-07 DIAGNOSIS — Z13220 Encounter for screening for lipoid disorders: Secondary | ICD-10-CM

## 2017-02-07 DIAGNOSIS — N200 Calculus of kidney: Secondary | ICD-10-CM | POA: Diagnosis not present

## 2017-02-07 DIAGNOSIS — Z0000 Encounter for general adult medical examination without abnormal findings: Secondary | ICD-10-CM | POA: Diagnosis not present

## 2017-02-07 LAB — LIPID PANEL
CHOLESTEROL: 204 mg/dL — AB (ref 0–200)
HDL: 44.9 mg/dL (ref >39.00)
LDL Cholesterol: 130 mg/dL — ABNORMAL HIGH (ref 0–99)
NONHDL: 158.88
TRIGLYCERIDES: 144 mg/dL (ref 0.0–149.0)
Total CHOL/HDL Ratio: 5
VLDL: 28.8 mg/dL (ref 0.0–40.0)

## 2017-02-07 LAB — COMPREHENSIVE METABOLIC PANEL
ALT: 14 U/L (ref 0–53)
AST: 19 U/L (ref 0–37)
Albumin: 4.2 g/dL (ref 3.5–5.2)
Alkaline Phosphatase: 56 U/L (ref 39–117)
BILIRUBIN TOTAL: 0.9 mg/dL (ref 0.2–1.2)
BUN: 16 mg/dL (ref 6–23)
CHLORIDE: 104 meq/L (ref 96–112)
CO2: 31 meq/L (ref 19–32)
CREATININE: 1.07 mg/dL (ref 0.40–1.50)
Calcium: 9.3 mg/dL (ref 8.4–10.5)
GFR: 72.63 mL/min (ref >60.00)
Glucose, Bld: 108 mg/dL — ABNORMAL HIGH (ref 70–99)
Potassium: 4.3 mEq/L (ref 3.5–5.1)
SODIUM: 139 meq/L (ref 135–145)
Total Protein: 7.3 g/dL (ref 6.0–8.3)

## 2017-02-07 NOTE — Patient Instructions (Signed)
[name] , Thank you for taking time to come for your Medicare Wellness Visit. I appreciate your ongoing commitment to your health goals. Please review the following plan we discussed and let me know if I can assist you in the future.   These are the goals we discussed: Goals    . Increase water intake          Starting 02/07/17, I will attempt to drink at least 4-6 glasses of water daily.        This is a list of the screening recommended for you and due dates:  Health Maintenance  Topic Date Due  . Colon Cancer Screening  02/10/2017*  . Flu Shot  08/06/2017*  . Pneumonia vaccines (1 of 2 - PCV13) 08/06/2017*  . Tetanus Vaccine  03/11/2023*  .  Hepatitis C: One time screening is recommended by Center for Disease Control  (CDC) for  adults born from [birth year] through 1964.   Completed  *Topic was postponed. The date shown is not the original due date.   Preventive Care for Adults  A healthy lifestyle and preventive care can promote health and wellness. Preventive health guidelines for adults include the following key practices.  . A routine yearly physical is a good way to check with your health care provider about your health and preventive screening. It is a chance to share any concerns and updates on your health and to receive a thorough exam.  . Visit your dentist for a routine exam and preventive care every 6 months. Brush your teeth twice a day and floss once a day. Good oral hygiene prevents tooth decay and gum disease.  . The frequency of eye exams is based on your age, health, family medical history, use  of contact lenses, and other factors. Follow your health care provider's ecommendations for frequency of eye exams.  . Eat a healthy diet. Foods like vegetables, fruits, whole grains, low-fat dairy products, and lean protein foods contain the nutrients you need without too many calories. Decrease your intake of foods high in solid fats, added sugars, and salt. Eat the right  amount of calories for you. Get information about a proper diet from your health care provider, if necessary.  . Regular physical exercise is one of the most important things you can do for your health. Most adults should get at least 150 minutes of moderate-intensity exercise (any activity that increases your heart rate and causes you to sweat) each week. In addition, most adults need muscle-strengthening exercises on 2 or more days a week.  Silver Sneakers may be a benefit available to you. To determine eligibility, you may visit the website: www.silversneakers.com or contact program at (424) 678-0259 Mon-Fri between 8AM-8PM.   . Maintain a healthy weight. The body mass index (BMI) is a screening tool to identify possible weight problems. It provides an estimate of body fat based on height and weight. Your health care provider can find your BMI and can help you achieve or maintain a healthy weight.   For adults 20 years and older: ? A BMI below 18.5 is considered underweight. ? A BMI of 18.5 to 24.9 is normal. ? A BMI of 25 to 29.9 is considered overweight. ? A BMI of 30 and above is considered obese.   . Maintain normal blood lipids and cholesterol levels by exercising and minimizing your intake of saturated fat. Eat a balanced diet with plenty of fruit and vegetables. Blood tests for lipids and cholesterol should begin at  age 17 and be repeated every 5 years. If your lipid or cholesterol levels are high, you are over 50, or you are at high risk for heart disease, you may need your cholesterol levels checked more frequently. Ongoing high lipid and cholesterol levels should be treated with medicines if diet and exercise are not working.  . If you smoke, find out from your health care provider how to quit. If you do not use tobacco, please do not start.  . If you choose to drink alcohol, please do not consume more than 2 drinks per day. One drink is considered to be 12 ounces (355 mL) of beer, 5  ounces (148 mL) of wine, or 1.5 ounces (44 mL) of liquor.  . If you are 52-13 years old, ask your health care provider if you should take aspirin to prevent strokes.  . Use sunscreen. Apply sunscreen liberally and repeatedly throughout the day. You should seek shade when your shadow is shorter than you. Protect yourself by wearing long sleeves, pants, a wide-brimmed hat, and sunglasses year round, whenever you are outdoors.  . Once a month, do a whole body skin exam, using a mirror to look at the skin on your back. Tell your health care provider of new moles, moles that have irregular borders, moles that are larger than a pencil eraser, or moles that have changed in shape or color.

## 2017-02-07 NOTE — Progress Notes (Signed)
Subjective:   Trevor Ross is a 71 y.o. male who presents for an Initial Medicare Annual Wellness Visit.  Review of Systems  N/A Cardiac Risk Factors include: advanced age (>3men, >34 women);male gender;obesity (BMI >30kg/m2)    Objective:    Today's Vitals   02/07/17 1015  BP: 130/82  Pulse: 63  Temp: 97.9 F (36.6 C)  TempSrc: Oral  SpO2: 97%  Weight: 209 lb (94.8 kg)  Height: 5' 8.75" (1.746 m)  PainSc: 0-No pain   Body mass index is 31.09 kg/m.  Current Medications (verified) Outpatient Encounter Prescriptions as of 02/07/2017  Medication Sig  . multivitamin-lutein (OCUVITE-LUTEIN) CAPS capsule Take 1 capsule by mouth daily.  Marland Kitchen omeprazole (PRILOSEC) 20 MG capsule Take 1 capsule (20 mg total) by mouth daily.  . sildenafil (REVATIO) 20 MG tablet 20 mg. Take 2 tablets by mouth as needed.   No facility-administered encounter medications on file as of 02/07/2017.     Allergies (verified) Patient has no known allergies.   History: Past Medical History:  Diagnosis Date  . Allergy   . Arthritis    hands  . Diverticulosis of colon 09/15/2006  . Dysphagia    per pt, having difficulty over last few months  . Hiatal hernia   . Nasal polyps   . Nephrolithiasis    PSA per Dr. Serita Butcher   Past Surgical History:  Procedure Laterality Date  . APPENDECTOMY  1988  . Left knee repair  1970's  . LITHOTRIPSY     twice per pt  . right knee surgery     arthroscopic  . Sinus surgery for polyps     Family History  Problem Relation Age of Onset  . Heart disease Mother   . Heart disease Father    Social History   Occupational History  . HVAC Mechanic    Social History Main Topics  . Smoking status: Never Smoker  . Smokeless tobacco: Never Used  . Alcohol use 2.4 - 3.0 oz/week    4 - 5 Cans of beer per week  . Drug use: No  . Sexual activity: Not on file   Tobacco Counseling Counseling given: No   Activities of Daily Living In your present state of  health, do you have any difficulty performing the following activities: 02/07/2017  Hearing? N  Vision? N  Difficulty concentrating or making decisions? N  Walking or climbing stairs? N  Dressing or bathing? N  Doing errands, shopping? N  Preparing Food and eating ? N  Using the Toilet? N  In the past six months, have you accidently leaked urine? N  Do you have problems with loss of bowel control? N  Managing your Medications? N  Managing your Finances? N  Housekeeping or managing your Housekeeping? N  Some recent data might be hidden    Immunizations and Health Maintenance Immunization History  Administered Date(s) Administered  . Td 03/12/2003  . Zoster 11/01/2012   There are no preventive care reminders to display for this patient.  Patient Care Team: Tonia Ghent, MD as PCP - General (Family Medicine)    Assessment:   This is a routine wellness examination for Trevor Ross.  Hearing/Vision screen  Hearing Screening   125Hz  250Hz  500Hz  1000Hz  2000Hz  3000Hz  4000Hz  6000Hz  8000Hz   Right ear:   40 40 40  40    Left ear:   40 40 40  40      Visual Acuity Screening   Right eye Left eye Both  eyes  Without correction: 20/13 20/13 20/15   With correction:       Dietary issues and exercise activities discussed: Current Exercise Habits: The patient has a physically strenous job, but has no regular exercise apart from work. (yard work 3-4 hrs 2 days/wk weather permitting), Exercise limited by: None identified  Goals    . Increase water intake          Starting 02/07/17, I will attempt to drink at least 4-6 glasses of water daily.       Depression Screen PHQ 2/9 Scores 02/07/2017  PHQ - 2 Score 0  PHQ- 9 Score 0    Fall Risk Fall Risk  02/07/2017  Falls in the past year? No    Cognitive Function: MMSE - Mini Mental State Exam 02/07/2017  Orientation to time 5  Orientation to Place 5  Registration 3  Attention/ Calculation 0  Recall 3  Language- name 2 objects 0    Language- repeat 1  Language- follow 3 step command 3  Language- read & follow direction 0  Write a sentence 0  Copy design 0  Total score 20     PLEASE NOTE: A Mini-Cog screen was completed. Maximum score is 20. A value of 0 denotes this part of Folstein MMSE was not completed or the patient failed this part of the Mini-Cog screening.   Mini-Cog Screening Orientation to Time - Max 5 pts Orientation to Place - Max 5 pts Registration - Max 3 pts Recall - Max 3 pts Language Repeat - Max 1 pts Language Follow 3 Step Command - Max 3 pts     Screening Tests Health Maintenance  Topic Date Due  . COLONOSCOPY  02/10/2017 (Originally 09/14/2016)  . INFLUENZA VACCINE  08/06/2017 (Originally 12/07/2016)  . PNA vac Low Risk Adult (1 of 2 - PCV13) 08/06/2017 (Originally 02/29/2012)  . TETANUS/TDAP  03/11/2023 (Originally 03/11/2013)  . Hepatitis C Screening  Completed        Plan:     I have personally reviewed and addressed the Medicare Annual Wellness questionnaire and have noted the following in the patient's chart:  A. Medical and social history B. Use of alcohol, tobacco or illicit drugs  C. Current medications and supplements D. Functional ability and status E.  Nutritional status F.  Physical activity G. Advance directives H. List of other physicians I.  Hospitalizations, surgeries, and ER visits in previous 12 months J.  [place] to include hearing, vision, cognitive, depression L. Referrals and appointments - none  In addition, I have reviewed and discussed with patient certain preventive protocols, quality metrics, and best practice recommendations. A written personalized care plan for preventive services as well as general preventive health recommendations were provided to patient.  See attached scanned questionnaire for additional information.   Signed,   Lindell Noe, MHA, BS, LPN Health Coach

## 2017-02-07 NOTE — Progress Notes (Signed)
Pre visit review using our clinic review tool, if applicable. No additional management support is needed unless otherwise documented below in the visit note. 

## 2017-02-07 NOTE — Progress Notes (Signed)
PCP notes:   Health maintenance:  Colonoscopy - addressed Flu vaccine - pt declined PNA vaccine - pt declined Tetanus vaccine - postponed/addressed Hep C screening - completed  Abnormal screenings:  None  Patient concerns:  None  Nurse concerns: None  Next PCP appt:  02/14/17 @ 0945  I reviewed health advisor's note, was available for consultation on the day of service listed in this note, and agree with documentation and plan. Elsie Stain, MD.

## 2017-02-08 LAB — HEPATITIS C ANTIBODY
Hepatitis C Ab: NONREACTIVE
SIGNAL TO CUT-OFF: 0.02 (ref <1.00)

## 2017-02-10 ENCOUNTER — Encounter: Payer: Self-pay | Admitting: Gastroenterology

## 2017-02-10 ENCOUNTER — Ambulatory Visit (AMBULATORY_SURGERY_CENTER): Payer: PPO | Admitting: Gastroenterology

## 2017-02-10 VITALS — BP 120/76 | HR 62 | Temp 98.4°F | Resp 13 | Ht 68.75 in | Wt 209.0 lb

## 2017-02-10 DIAGNOSIS — D125 Benign neoplasm of sigmoid colon: Secondary | ICD-10-CM

## 2017-02-10 DIAGNOSIS — D128 Benign neoplasm of rectum: Secondary | ICD-10-CM | POA: Diagnosis not present

## 2017-02-10 DIAGNOSIS — Z1211 Encounter for screening for malignant neoplasm of colon: Secondary | ICD-10-CM

## 2017-02-10 DIAGNOSIS — K635 Polyp of colon: Secondary | ICD-10-CM

## 2017-02-10 DIAGNOSIS — R1310 Dysphagia, unspecified: Secondary | ICD-10-CM | POA: Diagnosis not present

## 2017-02-10 DIAGNOSIS — K222 Esophageal obstruction: Secondary | ICD-10-CM

## 2017-02-10 DIAGNOSIS — K219 Gastro-esophageal reflux disease without esophagitis: Secondary | ICD-10-CM | POA: Diagnosis not present

## 2017-02-10 DIAGNOSIS — D129 Benign neoplasm of anus and anal canal: Secondary | ICD-10-CM

## 2017-02-10 DIAGNOSIS — E669 Obesity, unspecified: Secondary | ICD-10-CM | POA: Diagnosis not present

## 2017-02-10 MED ORDER — SODIUM CHLORIDE 0.9 % IV SOLN: 500.0000 mL | INTRAVENOUS | Status: DC

## 2017-02-10 NOTE — Progress Notes (Signed)
Report to PACU, RN, vss, BBS= Clear.  

## 2017-02-10 NOTE — Patient Instructions (Addendum)
    YOU HAD AN ENDOSCOPIC PROCEDURE TODAY AT [facility]:   Refer to the procedure report that was given to you for any specific questions about what was found during the examination.  If the procedure report does not answer your questions, please call your gastroenterologist to clarify.  If you requested that your care partner not be given the details of your procedure findings, then the procedure report has been included in a sealed envelope for you to review at your convenience later.  YOU SHOULD EXPECT: Some feelings of bloating in the abdomen. Passage of more gas than usual.  Walking can help get rid of the air that was put into your GI tract during the procedure and reduce the bloating. If you had a lower endoscopy (such as a colonoscopy or flexible sigmoidoscopy) you may notice spotting of blood in your stool or on the toilet paper. If you underwent a bowel prep for your procedure, you may not have a normal bowel movement for a few days.  Please Note:  You might notice some irritation and congestion in your nose or some drainage.  This is from the oxygen used during your procedure.  There is no need for concern and it should clear up in a day or so.  SYMPTOMS TO REPORT IMMEDIATELY:   Following lower endoscopy (colonoscopy or flexible sigmoidoscopy):  Excessive amounts of blood in the stool  Significant tenderness or worsening of abdominal pains  Swelling of the abdomen that is new, acute  Fever of 100F or higher   Following upper endoscopy (EGD)  Vomiting of blood or coffee ground material  New chest pain or pain under the shoulder blades  Painful or persistently difficult swallowing  New shortness of breath  Fever of 100F or higher  Black, tarry-looking stools  For urgent or emergent issues, a gastroenterologist can be reached at any hour by calling [phone].   DIET:  Follow Dilation Handout.  ACTIVITY:  You should plan to take it easy for the rest of  today and you should NOT DRIVE or use heavy machinery until tomorrow (because of the sedation medicines used during the test).    FOLLOW UP: Our staff will call the number listed on your records the next business day following your procedure to check on you and address any questions or concerns that you may have regarding the information given to you following your procedure. If we do not reach you, we will leave a message.  However, if you are feeling well and you are not experiencing any problems, there is no need to return our call.  We will assume that you have returned to your regular daily activities without incident.  If any biopsies were taken you will be contacted by phone or by letter within the next 1-3 weeks.  Please call us at [phone] if you have not heard about the biopsies in 3 weeks.    SIGNATURES/CONFIDENTIALITY: You and/or your care partner have signed paperwork which will be entered into your electronic medical record.  These signatures attest to the fact that that the information above on your After Visit Summary has been reviewed and is understood.  Full responsibility of the confidentiality of this discharge information lies with you and/or your care-partner.   Resume medications. Information given on stricture,Dilation Diet,polyps and diverticulosis.

## 2017-02-10 NOTE — Progress Notes (Signed)
Pt's states no medical or surgical changes since previsit or office visit. 

## 2017-02-10 NOTE — Progress Notes (Signed)
1407 recovery assessment documented in error.

## 2017-02-10 NOTE — Op Note (Signed)
[facility] Patient Name: Trevor Ross Procedure Date: 02/10/2017 2:09 PM MRN: [id number] Endoscopist: Milus Banister , MD Age: 71 Referring MD:  Date of Birth: Aug 11, 1945 Gender: Male Account #: [id number] Procedure:                Colonoscopy Indications:              Screening for colorectal malignant neoplasm Medicines:                Monitored Anesthesia Care Procedure:                Pre-Anesthesia Assessment:                           - Prior to the procedure, a History and Physical                            was performed, and patient medications and                            allergies were reviewed. The patient's tolerance of                            previous anesthesia was also reviewed. The risks                            and benefits of the procedure and the sedation                            options and risks were discussed with the patient.                            All questions were answered, and informed consent                            was obtained. Prior Anticoagulants: The patient has                            taken no previous anticoagulant or antiplatelet                            agents. ASA Grade Assessment: II - A patient with                            mild systemic disease. After reviewing the risks                            and benefits, the patient was deemed in                            satisfactory condition to undergo the procedure.                           After obtaining informed consent, the colonoscope  was passed under direct vision. Throughout the                            procedure, the patient's blood pressure, pulse, and                            oxygen saturations were monitored continuously. The                            Colonoscope was introduced through the anus and                            advanced to the the cecum, identified by                            appendiceal orifice and  ileocecal valve. The                            colonoscopy was performed without difficulty. The                            patient tolerated the procedure well. The quality                            of the bowel preparation was excellent. The                            ileocecal valve, appendiceal orifice, and rectum                            were photographed. Scope In: 2:18:16 PM Scope Out: 2:28:32 PM Scope Withdrawal Time: 0 hours 8 minutes 9 seconds  Total Procedure Duration: 0 hours 10 minutes 16 seconds  Findings:                 Two sessile polyps were found in the rectum and                            sigmoid colon. The polyps were 3 to 5 mm in size.                            These polyps were removed with a cold snare.                            Resection and retrieval were complete.                           Multiple small-mouthed diverticula were found in                            the left colon.                           The exam was otherwise without abnormality on  direct and retroflexion views. Complications:            No immediate complications. Estimated blood loss:                            None. Estimated Blood Loss:     Estimated blood loss: none. Impression:               - Two 3 to 5 mm polyps in the rectum and in the                            sigmoid colon, removed with a cold snare. Resected                            and retrieved.                           - Diverticulosis in the left colon.                           - The examination was otherwise normal on direct                            and retroflexion views. Recommendation:           - Patient has a contact number available for                            emergencies. The signs and symptoms of potential                            delayed complications were discussed with the                            patient. Return to normal activities tomorrow.                             Written discharge instructions were provided to the                            patient.                           - Resume previous diet.                           - Continue present medications.                           You will receive a letter within 2-3 weeks with the                            pathology results and my final recommendations.                           If the polyp(s) is proven to be 'pre-cancerous' on  pathology, you will need repeat colonoscopy in 5                            years. If the polyp(s) is NOT 'precancerous' on                            pathology then you should repeat colon cancer                            screening in 10 years with colonoscopy without need                            for colon cancer screening by any method prior to                            then (including stool testing). Milus Banister, MD 02/10/2017 2:42:40 PM This report has been signed electronically.

## 2017-02-10 NOTE — Op Note (Signed)
[facility] Patient Name: Trevor Ross Procedure Date: 02/10/2017 2:09 PM MRN: [id number] Endoscopist: Milus Banister , MD Age: 71 Referring MD:  Date of Birth: [birth year] Gender: Male Account #: [id number] Procedure:                Upper GI endoscopy Indications:              Dysphagia Medicines:                Monitored Anesthesia Care Procedure:                Pre-Anesthesia Assessment:                           - Prior to the procedure, a History and Physical                            was performed, and patient medications and                            allergies were reviewed. The patient's tolerance of                            previous anesthesia was also reviewed. The risks                            and benefits of the procedure and the sedation                            options and risks were discussed with the patient.                            All questions were answered, and informed consent                            was obtained. Prior Anticoagulants: The patient has                            taken no previous anticoagulant or antiplatelet                            agents. ASA Grade Assessment: II - A patient with                            mild systemic disease. After reviewing the risks                            and benefits, the patient was deemed in                            satisfactory condition to undergo the procedure.                           After obtaining informed consent, the endoscope was  passed under direct vision. Throughout the                            procedure, the patient's blood pressure, pulse, and                            oxygen saturations were monitored continuously. The                            Endoscope was introduced through the mouth, and                            advanced to the second part of duodenum. The upper                            GI endoscopy was accomplished without  difficulty.                            The patient tolerated the procedure well. Scope In: Scope Out: Findings:                 One mild benign-appearing, intrinsic stenosis                            (focal peptic appearing stricture) was found at the                            gastroesophageal junction. A TTS dilator was passed                            through the scope. Dilation with an 18-19-20 mm                            balloon dilator was performed to 19 mm. There was                            the usual superficial mucosal tear and self limited                            oozing of blood following dilation.                           The exam was otherwise without abnormality. Complications:            No immediate complications. Estimated blood loss:                            None. Estimated Blood Loss:     Estimated blood loss: none. Impression:               - Focal peptic appearing stricture, dilated to 36mm                            with balloon.                           -  The examination was otherwise normal. Recommendation:           - Patient has a contact number available for                            emergencies. The signs and symptoms of potential                            delayed complications were discussed with the                            patient. Return to normal activities tomorrow.                            Written discharge instructions were provided to the                            patient.                           - Resume previous diet.                           - Continue present medications. Continue once daily                            OTC omeprazole.                           - Repeat upper endoscopy PRN for retreatment. Milus Banister, MD 02/10/2017 2:45:39 PM This report has been signed electronically.

## 2017-02-10 NOTE — Progress Notes (Signed)
Called to room to assist during endoscopic procedure.  Patient ID and intended procedure confirmed with present staff. Received instructions for my participation in the procedure from the performing physician.  

## 2017-02-13 ENCOUNTER — Telehealth: Payer: Self-pay

## 2017-02-13 NOTE — Telephone Encounter (Signed)
  Follow up Call-  Call Loi Rennaker number 02/10/2017  Post procedure Call Jarek Longton phone  # [phone]  Permission to leave phone message Yes  Some recent data might be hidden     Patient questions:  Do you have a fever, pain , or abdominal swelling? No. Pain Score  0 *  Have you tolerated food without any problems? Yes.    Have you been able to return to your normal activities? Yes.    Do you have any questions about your discharge instructions: Diet   No. Medications  No. Follow up visit  No.  Do you have questions or concerns about your Care? No.  Actions: * If pain score is 4 or above: No action needed, pain <4.

## 2017-02-14 ENCOUNTER — Ambulatory Visit (INDEPENDENT_AMBULATORY_CARE_PROVIDER_SITE_OTHER): Payer: PPO | Admitting: Family Medicine

## 2017-02-14 ENCOUNTER — Encounter: Payer: Self-pay | Admitting: Family Medicine

## 2017-02-14 VITALS — BP 130/82 | HR 63 | Temp 97.9°F | Ht 68.75 in | Wt 209.0 lb

## 2017-02-14 DIAGNOSIS — Z7189 Other specified counseling: Secondary | ICD-10-CM

## 2017-02-14 DIAGNOSIS — Z23 Encounter for immunization: Secondary | ICD-10-CM | POA: Diagnosis not present

## 2017-02-14 DIAGNOSIS — R739 Hyperglycemia, unspecified: Secondary | ICD-10-CM

## 2017-02-14 DIAGNOSIS — Z0000 Encounter for general adult medical examination without abnormal findings: Secondary | ICD-10-CM

## 2017-02-14 DIAGNOSIS — K219 Gastro-esophageal reflux disease without esophagitis: Secondary | ICD-10-CM

## 2017-02-14 MED ORDER — OMEPRAZOLE 20 MG PO CPDR: 20.0000 mg | DELAYED_RELEASE_CAPSULE | Freq: Every day | ORAL | 3 refills | Status: DC

## 2017-02-14 NOTE — Progress Notes (Signed)
Tetanus 2004, d/w pt.   zostavax done 2014.   PNA 13 today.   Flu shot today.  D/w pt.   PSA per urology- I'll defer.   Colonoscopy 2018.  Path pending.  He'll need 5 vs 10 year f/u depending on path, d/w pt.    Wife and daughter Ailene Ravel equally designated if patient were incapacitated.    GERD controlled with PPI.  No ADE on med.  S/p EGD. Swallowing better s/p dilation.    Hyperglycemia d/w pt.  discussed with patient about diet and exercise. Needs to cut out cookies and soda.  PMH and SH reviewed  Meds, vitals, and allergies reviewed.   ROS: Per HPI.  Unless specifically indicated otherwise in HPI, the patient denies:  General: fever. Eyes: acute vision changes ENT: sore throat Cardiovascular: chest pain Respiratory: SOB GI: vomiting GU: dysuria Musculoskeletal: acute back pain Derm: acute rash Neuro: acute motor dysfunction Psych: worsening mood Endocrine: polydipsia Heme: bleeding Allergy: hayfever  GEN: nad, alert and oriented HEENT: mucous membranes moist NECK: supple w/o LA CV: rrr. PULM: ctab, no inc wob ABD: soft, +bs EXT: no edema SKIN: no acute rash

## 2017-02-14 NOTE — Patient Instructions (Addendum)
Check with your insurance to see if they will cover the tetanus shot.  It may be cheaper at the pharmacy.  Take prilosec daily.  Update me as needed.  Take care.  Glad to see you.

## 2017-02-15 ENCOUNTER — Encounter: Payer: Self-pay | Admitting: Gastroenterology

## 2017-02-15 DIAGNOSIS — R739 Hyperglycemia, unspecified: Secondary | ICD-10-CM | POA: Insufficient documentation

## 2017-02-15 DIAGNOSIS — Z0000 Encounter for general adult medical examination without abnormal findings: Secondary | ICD-10-CM | POA: Insufficient documentation

## 2017-02-15 DIAGNOSIS — Z7189 Other specified counseling: Secondary | ICD-10-CM | POA: Insufficient documentation

## 2017-02-15 DIAGNOSIS — K219 Gastro-esophageal reflux disease without esophagitis: Secondary | ICD-10-CM | POA: Insufficient documentation

## 2017-02-15 NOTE — Assessment & Plan Note (Signed)
  Wife and daughter Meredith equally designated if patient were incapacitated.   

## 2017-02-15 NOTE — Assessment & Plan Note (Signed)
Tetanus 2004, d/w pt.   zostavax done 2014.   PNA 13 today.   Flu shot today.  D/w pt.   PSA per urology- I'll defer.   Colonoscopy 2018.  Path pending.  He'll need 5 vs 10 year f/u depending on path, d/w pt.

## 2017-02-15 NOTE — Assessment & Plan Note (Signed)
GERD controlled with PPI.  No ADE on med.  S/p EGD. Swallowing better s/p dilation.  Continue as is.

## 2017-02-15 NOTE — Assessment & Plan Note (Signed)
Hyperglycemia d/w pt.  discussed with patient about diet and exercise. Needs to cut out cookies and soda. Labs d/w pt.  >25 minutes spent in face to face time with patient, >50% spent in counselling or coordination of care.

## 2017-02-21 ENCOUNTER — Encounter: Payer: Self-pay | Admitting: Internal Medicine

## 2017-02-21 DIAGNOSIS — G4719 Other hypersomnia: Secondary | ICD-10-CM

## 2017-02-27 ENCOUNTER — Telehealth: Payer: Self-pay | Admitting: *Deleted

## 2017-02-27 DIAGNOSIS — G4733 Obstructive sleep apnea (adult) (pediatric): Secondary | ICD-10-CM

## 2017-02-27 NOTE — Telephone Encounter (Signed)
Spoke with patient's wife and made aware patient has severe OSA. Orders will be placed for cpap machine. Allow 2 weeks and they will receive a call from [place]. Call office to schedule compliance f/u [phone].

## 2017-03-15 ENCOUNTER — Encounter: Payer: Self-pay | Admitting: Family Medicine

## 2017-03-16 DIAGNOSIS — G4733 Obstructive sleep apnea (adult) (pediatric): Secondary | ICD-10-CM | POA: Diagnosis not present

## 2017-04-04 DIAGNOSIS — G4733 Obstructive sleep apnea (adult) (pediatric): Secondary | ICD-10-CM | POA: Diagnosis not present

## 2017-04-15 DIAGNOSIS — G4733 Obstructive sleep apnea (adult) (pediatric): Secondary | ICD-10-CM | POA: Diagnosis not present

## 2017-04-27 ENCOUNTER — Encounter: Payer: Self-pay | Admitting: Internal Medicine

## 2017-04-27 ENCOUNTER — Ambulatory Visit: Payer: PPO | Admitting: Internal Medicine

## 2017-04-27 VITALS — BP 122/80 | HR 64 | Temp 97.9°F | Wt 217.0 lb

## 2017-04-27 DIAGNOSIS — R0981 Nasal congestion: Secondary | ICD-10-CM

## 2017-04-27 MED ORDER — FLUTICASONE PROPIONATE 50 MCG/ACT NA SUSP: 2.0000 | Freq: Every day | NASAL | 12 refills | Status: DC

## 2017-04-27 NOTE — Patient Instructions (Signed)
Please cut back on the sinex---none if possible, but only at night for now until the prescription helps reduce the swelling.  Consider a humidifier for the house.

## 2017-04-27 NOTE — Progress Notes (Signed)
Subjective:    Patient ID: Trevor Ross, male    DOB: 1945-08-06, 71 y.o.   MRN: [id number]  HPI Here due to sinus problems  Stopped up nose--sinex will help but it doesn't clear it up for long Uses CPAP-- has to use the spray just to use it  No cold symptoms like sore throat No fever Tends to get stopped up every year Gas heat---no humidifier (other than on the CPAP)  No nasal drainage Only occasional cough Occasional feeling of SOB with activity  Current Outpatient Medications on File Prior to Visit  Medication Sig Dispense Refill  . multivitamin-lutein (OCUVITE-LUTEIN) CAPS capsule Take 1 capsule by mouth daily.    Marland Kitchen omeprazole (PRILOSEC) 20 MG capsule Take 1 capsule (20 mg total) by mouth daily. 90 capsule 3  . sildenafil (REVATIO) 20 MG tablet 20 mg. Take 2 tablets by mouth as needed.     Current Facility-Administered Medications on File Prior to Visit  Medication Dose Route Frequency Provider Last Rate Last Dose  . 0.9 %  sodium chloride infusion  500 mL Intravenous Continuous Milus Banister, MD        No Known Allergies  Past Medical History:  Diagnosis Date  . Allergy   . Arthritis    hands  . Diverticulosis of colon 09/15/2006  . Dysphagia    per pt, having difficulty over last few months  . Hiatal hernia   . Nasal polyps   . Nephrolithiasis    PSA per Dr. Serita Butcher    Past Surgical History:  Procedure Laterality Date  . APPENDECTOMY  1988  . CARPAL TUNNEL RELEASE Left   . Left knee repair  1970's  . LITHOTRIPSY     twice per pt  . right knee surgery     arthroscopic  . Sinus surgery for polyps      Family History  Problem Relation Age of Onset  . Heart disease Mother   . Prostate cancer Father   . Colon cancer Neg Hx     Social History   Socioeconomic History  . Marital status: Married    Spouse name: Not on file  . Number of children: 0  . Years of education: Not on file  . Highest education level: Not on file  Social Needs    . Financial resource strain: Not on file  . Food insecurity - worry: Not on file  . Food insecurity - inability: Not on file  . Transportation needs - medical: Not on file  . Transportation needs - non-medical: Not on file  Occupational History  . Occupation: Insurance account manager  Tobacco Use  . Smoking status: Never Smoker  . Smokeless tobacco: Never Used  Substance and Sexual Activity  . Alcohol use: Yes    Alcohol/week: 0.0 oz    Comment: up to 4-5 per week, sometimes none  . Drug use: No  . Sexual activity: Not on file  Other Topics Concern  . Not on file  Social History Narrative   1 step-daughter, 1 grandson   Married 1980   Review of Systems No vomiting or diarrhea Appetite is okay    Objective:   Physical Exam  HENT:  Mouth/Throat: Oropharynx is clear and moist. No oropharyngeal exudate.  No sinus tenderness TMs normal Marked pale nasal swelling   Neck: No thyromegaly present.  Pulmonary/Chest: Effort normal and breath sounds normal. No respiratory distress. He has no wheezes. He has no rales.  Lymphadenopathy:    He has  no cervical adenopathy.          Assessment & Plan:

## 2017-04-27 NOTE — Assessment & Plan Note (Signed)
Doesn't seem to be sick Discussed humidifier Stop the OTC nasal and start flonase

## 2017-05-15 ENCOUNTER — Ambulatory Visit (INDEPENDENT_AMBULATORY_CARE_PROVIDER_SITE_OTHER): Payer: PPO | Admitting: Pulmonary Disease

## 2017-05-15 ENCOUNTER — Encounter: Payer: Self-pay | Admitting: Pulmonary Disease

## 2017-05-15 VITALS — BP 138/84 | HR 57 | Resp 16 | Ht 68.5 in | Wt 217.0 lb

## 2017-05-15 DIAGNOSIS — G4733 Obstructive sleep apnea (adult) (pediatric): Secondary | ICD-10-CM | POA: Diagnosis not present

## 2017-05-15 NOTE — Progress Notes (Signed)
PULMONARY/SLEEP OFFICE FOLLOW-UP NOTE  Requesting MD/Service: Elsie Stain, M.D. Date of initial consultation: 01/12/17 Reason for consultation: Suspected sleep apnea  PT PROFILE: 72 y.o. male never smoker referred for evaluation of heavy snoring of many years duration with occasional witnessed apneas  DATA: PSG 02/21/17: Moderate to severe OSA with AHI 32/hour.  Recommended auto CPAP with pressure range of 5-20 cm H2O Compliance 04/15/17-05/14/17: Usage 30/30 days.  Greater than  4 hours 30/30 days  SUBJ:  Since last visit, he has been diagnosed with sleep apnea and initiated on CPAP therapy.  He is using this compliantly as documented above.  He believes that it has helped a lot and he has improved sleep quality.  He has much reduced daytime hypersomnolence.  His wife reports that it has made a "big difference".  She also notes that he seems to be less restless during sleep.     Vitals:   05/15/17 1036 05/15/17 1040  BP:  138/84  Pulse:  (!) 57  Resp: 16   SpO2:  97%  Weight: 98.4 kg (217 lb)   Height: 5' 8.5" (1.74 m)      EXAM:  Gen: NAD  HEENT:  WNL Neck: Supple without JVD Lungs: breath sounds full without adventitious sounds Cardiovascular: RRR, no murmurs noted Abdomen: Soft, nontender, normal BS Ext: without clubbing, cyanosis, edema Neuro:  grossly intact Skin: Limited exam, no lesions noted  DATA:   BMP Latest Ref Rng & Units 02/07/2017 08/10/2012 03/13/2012  Glucose 70 - 99 mg/dL 108(H) 128(H) 92  BUN 6 - 23 mg/dL 16 19 18   Creatinine 0.40 - 1.50 mg/dL 1.07 1.07 1.2  Sodium 135 - 145 mEq/L 139 137 137  Potassium 3.5 - 5.1 mEq/L 4.3 3.7 4.1  Chloride 96 - 112 mEq/L 104 103 102  CO2 19 - 32 mEq/L 31 24 27   Calcium 8.4 - 10.5 mg/dL 9.3 9.1 9.2    CBC Latest Ref Rng & Units 08/10/2012 03/13/2012  WBC 4.0 - 10.5 K/uL 7.4 10.7(H)  Hemoglobin 13.0 - 17.0 g/dL 13.2 13.6  Hematocrit 39.0 - 52.0 % 38.0(L) 42.3  Platelets 150 - 400 K/uL 258 394.0    CXR: No new  film  IMPRESSION:     ICD-10-CM   1. OSA (obstructive sleep apnea) G47.33     PLAN:  Continue AutoSet CPAP with nasal mask We discussed the potential benefit of modest weight loss Follow-up in 1 year   Merton Border, MD PCCM service Mobile [phone] Pager [phone] 05/15/2017 11:12 AM

## 2017-05-15 NOTE — Patient Instructions (Addendum)
Continue CPAP Low up in 1 year

## 2017-05-16 DIAGNOSIS — G4733 Obstructive sleep apnea (adult) (pediatric): Secondary | ICD-10-CM | POA: Diagnosis not present

## 2017-06-16 DIAGNOSIS — G4733 Obstructive sleep apnea (adult) (pediatric): Secondary | ICD-10-CM | POA: Diagnosis not present

## 2017-07-14 DIAGNOSIS — G4733 Obstructive sleep apnea (adult) (pediatric): Secondary | ICD-10-CM | POA: Diagnosis not present

## 2017-08-14 DIAGNOSIS — G4733 Obstructive sleep apnea (adult) (pediatric): Secondary | ICD-10-CM | POA: Diagnosis not present

## 2017-09-13 DIAGNOSIS — G4733 Obstructive sleep apnea (adult) (pediatric): Secondary | ICD-10-CM | POA: Diagnosis not present

## 2017-09-27 ENCOUNTER — Encounter: Payer: Self-pay | Admitting: Family Medicine

## 2017-09-27 ENCOUNTER — Ambulatory Visit: Payer: PPO | Admitting: Family Medicine

## 2017-09-27 ENCOUNTER — Ambulatory Visit (INDEPENDENT_AMBULATORY_CARE_PROVIDER_SITE_OTHER): Payer: PPO | Admitting: Family Medicine

## 2017-09-27 ENCOUNTER — Encounter

## 2017-09-27 DIAGNOSIS — M545 Low back pain: Secondary | ICD-10-CM | POA: Diagnosis not present

## 2017-09-27 MED ORDER — PREDNISONE 20 MG PO TABS: ORAL_TABLET | ORAL | 0 refills | Status: DC

## 2017-09-27 NOTE — Patient Instructions (Signed)
Take prednisone with food.  Stop ibuprofen and aleve.   Update me as needed.  Take care.  Glad to see you.

## 2017-09-27 NOTE — Progress Notes (Signed)
Back pain.  Likely going on, "off and on for a long time."   Degenerative changes are seen in the lower thoracic and lumbar spine.  There is anterolisthesis of L5 on S1, associated with bilateral pars defects at L5. Vertebral hemangiomas are present.  More recently he had trouble getting on his knees and moving some landscaping rocks. Was twisting and turning and felt it happen.  No radiation down the legs.  It is in R>L but B lower back pain.  No blood in urine, no dysuria.  Taking a deep breath hurts.  Pain rolling over in the bed.    No falls, no trauma.  Less sore if walking.  More sore is sedentary.  No weakness.  No foot drop.  He used OTC nsaids in the meantime.    Meds, vitals, and allergies reviewed.   ROS: Per HPI unless specifically indicated in ROS section   nad ncat except for nasal irritation from cpap noted.   Neck supple rrr ctab abd soft Pain is in the B lower back, R>L, just above the beltline.   Lower back pain with B hip flexion.   SLR neg B, no radiation but some lower back discomfort with testing.   Pain with back flex and ext, not with twisting.   Able to bear weight on BLE

## 2017-09-29 NOTE — Assessment & Plan Note (Signed)
Likely a flare of arthritis related to recent lifting and stooping with moving landscaping rocks.  Discussed with patient about previous imaging and plan. Take prednisone with food.  Stop ibuprofen and aleve.  Routine steroid cautions given.  Defer imaging at this point as it would likely not change management.  He agrees. Update me as needed.

## 2017-10-10 DIAGNOSIS — G4733 Obstructive sleep apnea (adult) (pediatric): Secondary | ICD-10-CM | POA: Diagnosis not present

## 2017-10-14 DIAGNOSIS — G4733 Obstructive sleep apnea (adult) (pediatric): Secondary | ICD-10-CM | POA: Diagnosis not present

## 2017-11-13 DIAGNOSIS — G4733 Obstructive sleep apnea (adult) (pediatric): Secondary | ICD-10-CM | POA: Diagnosis not present

## 2017-12-14 DIAGNOSIS — G4733 Obstructive sleep apnea (adult) (pediatric): Secondary | ICD-10-CM | POA: Diagnosis not present

## 2017-12-18 ENCOUNTER — Other Ambulatory Visit: Payer: Self-pay | Admitting: Family Medicine

## 2018-01-14 DIAGNOSIS — G4733 Obstructive sleep apnea (adult) (pediatric): Secondary | ICD-10-CM | POA: Diagnosis not present

## 2018-02-06 ENCOUNTER — Other Ambulatory Visit: Payer: Self-pay | Admitting: Family Medicine

## 2018-02-06 DIAGNOSIS — R739 Hyperglycemia, unspecified: Secondary | ICD-10-CM

## 2018-02-08 ENCOUNTER — Ambulatory Visit: Payer: PPO

## 2018-02-08 ENCOUNTER — Ambulatory Visit (INDEPENDENT_AMBULATORY_CARE_PROVIDER_SITE_OTHER): Payer: PPO

## 2018-02-08 VITALS — BP 130/90 | HR 60 | Temp 97.6°F | Ht 69.5 in | Wt 217.2 lb

## 2018-02-08 DIAGNOSIS — Z0000 Encounter for general adult medical examination without abnormal findings: Secondary | ICD-10-CM

## 2018-02-08 DIAGNOSIS — R739 Hyperglycemia, unspecified: Secondary | ICD-10-CM | POA: Diagnosis not present

## 2018-02-08 LAB — COMPREHENSIVE METABOLIC PANEL
ALBUMIN: 4.1 g/dL (ref 3.5–5.2)
ALT: 15 U/L (ref 0–53)
AST: 19 U/L (ref 0–37)
Alkaline Phosphatase: 57 U/L (ref 39–117)
BUN: 16 mg/dL (ref 6–23)
CALCIUM: 9 mg/dL (ref 8.4–10.5)
CHLORIDE: 104 meq/L (ref 96–112)
CO2: 32 mEq/L (ref 19–32)
CREATININE: 1.09 mg/dL (ref 0.40–1.50)
GFR: 70.89 mL/min (ref >60.00)
Glucose, Bld: 101 mg/dL — ABNORMAL HIGH (ref 70–99)
POTASSIUM: 4 meq/L (ref 3.5–5.1)
Sodium: 139 mEq/L (ref 135–145)
Total Bilirubin: 0.5 mg/dL (ref 0.2–1.2)
Total Protein: 7 g/dL (ref 6.0–8.3)

## 2018-02-08 LAB — LIPID PANEL
CHOLESTEROL: 192 mg/dL (ref 0–200)
HDL: 46 mg/dL (ref >39.00)
LDL CALC: 123 mg/dL — AB (ref 0–99)
NonHDL: 146.4
TRIGLYCERIDES: 119 mg/dL (ref 0.0–149.0)
Total CHOL/HDL Ratio: 4
VLDL: 23.8 mg/dL (ref 0.0–40.0)

## 2018-02-08 NOTE — Patient Instructions (Signed)
Trevor Ross , Thank you for taking time to come for your Medicare Wellness Visit. I appreciate your ongoing commitment to your health goals. Please review the following plan we discussed and let me know if I can assist you in the future.   These are the goals we discussed: Goals    . Increase physical activity     Starting 02/08/2018, I will continue to exercise for 30-45 minutes 3 days per week.        This is a list of the screening recommended for you and due dates:  Health Maintenance  Topic Date Due  . Flu Shot  08/08/2018*  . Pneumonia vaccines (2 of 2 - PPSV23) 02/14/2018  . Colon Cancer Screening  02/10/2022  . Tetanus Vaccine  03/01/2027  .  Hepatitis C: One time screening is recommended by Center for Disease Control  (CDC) for  adults born from [birth year] through 1964.   Completed  *Topic was postponed. The date shown is not the original due date.   Preventive Care for Adults  A healthy lifestyle and preventive care can promote health and wellness. Preventive health guidelines for adults include the following key practices.  . A routine yearly physical is a good way to check with your health care provider about your health and preventive screening. It is a chance to share any concerns and updates on your health and to receive a thorough exam.  . Visit your dentist for a routine exam and preventive care every 6 months. Brush your teeth twice a day and floss once a day. Good oral hygiene prevents tooth decay and gum disease.  . The frequency of eye exams is based on your age, health, family medical history, use  of contact lenses, and other factors. Follow your health care provider's recommendations for frequency of eye exams.  . Eat a healthy diet. Foods like vegetables, fruits, whole grains, low-fat dairy products, and lean protein foods contain the nutrients you need without too many calories. Decrease your intake of foods high in solid fats, added sugars, and salt. Eat the right  amount of calories for you. Get information about a proper diet from your health care provider, if necessary.  . Regular physical exercise is one of the most important things you can do for your health. Most adults should get at least 150 minutes of moderate-intensity exercise (any activity that increases your heart rate and causes you to sweat) each week. In addition, most adults need muscle-strengthening exercises on 2 or more days a week.  Silver Sneakers may be a benefit available to you. To determine eligibility, you may visit the website: www.silversneakers.com or contact program at (873) 097-4737 Mon-Fri between 8AM-8PM.   . Maintain a healthy weight. The body mass index (BMI) is a screening tool to identify possible weight problems. It provides an estimate of body fat based on height and weight. Your health care provider can find your BMI and can help you achieve or maintain a healthy weight.   For adults 20 years and older: ? A BMI below 18.5 is considered underweight. ? A BMI of 18.5 to 24.9 is normal. ? A BMI of 25 to 29.9 is considered overweight. ? A BMI of 30 and above is considered obese.   . Maintain normal blood lipids and cholesterol levels by exercising and minimizing your intake of saturated fat. Eat a balanced diet with plenty of fruit and vegetables. Blood tests for lipids and cholesterol should begin at age 63 and be repeated  every 5 years. If your lipid or cholesterol levels are high, you are over 50, or you are at high risk for heart disease, you may need your cholesterol levels checked more frequently. Ongoing high lipid and cholesterol levels should be treated with medicines if diet and exercise are not working.  . If you smoke, find out from your health care provider how to quit. If you do not use tobacco, please do not start.  . If you choose to drink alcohol, please do not consume more than 2 drinks per day. One drink is considered to be 12 ounces (355 mL) of beer, 5  ounces (148 mL) of wine, or 1.5 ounces (44 mL) of liquor.  . If you are 65-97 years old, ask your health care provider if you should take aspirin to prevent strokes.  . Use sunscreen. Apply sunscreen liberally and repeatedly throughout the day. You should seek shade when your shadow is shorter than you. Protect yourself by wearing long sleeves, pants, a wide-brimmed hat, and sunglasses year round, whenever you are outdoors.  . Once a month, do a whole body skin exam, using a mirror to look at the skin on your back. Tell your health care provider of new moles, moles that have irregular borders, moles that are larger than a pencil eraser, or moles that have changed in shape or color.

## 2018-02-08 NOTE — Progress Notes (Signed)
PCP notes:   Health maintenance:  Flu vaccine - pt will get vaccine at [facility] PPSV23 - pt will get vaccine at [facility]  Abnormal screenings:   None  Patient concerns:   None  Nurse concerns:  None  Next PCP appt:   02/15/18 @ 0945  I reviewed health advisor's note, was available for consultation on the day of service listed in this note, and agree with documentation and plan. Elsie Stain, MD.

## 2018-02-08 NOTE — Progress Notes (Signed)
Subjective:   Trevor Ross is a 72 y.o. male who presents for Medicare Annual/Subsequent preventive examination.  Review of Systems:  N/A Cardiac Risk Factors include: advanced age (>96men, >12 women);male gender;obesity (BMI >30kg/m2)     Objective:    Vitals: BP 130/90 (BP Location: Left Arm, Patient Position: Sitting, Cuff Size: Normal)   Pulse 60   Temp 97.6 F (36.4 C) (Oral)   Ht 5' 9.5" (1.765 m)   Wt 217 lb 4 oz (98.5 kg)   SpO2 97%   BMI 31.62 kg/m   Body mass index is 31.62 kg/m.  Advanced Directives 02/08/2018 02/07/2017 08/20/2012 08/10/2012  Does Patient Have a Medical Advance Directive? No;Yes Yes - Patient has advance directive, copy not in chart  Type of Advance Directive [place];Living will [place];Living will - [place];Living will  Does patient want to make changes to medical advance directive? No - Patient declined - - -  Copy of [place] in Chart? No - copy requested No - copy requested - Copy requested from family  Would patient like information on creating a medical advance directive? No - Patient declined - - -  Pre-existing out of facility DNR order (yellow form or pink MOST form) - - No -    Tobacco Social History   Tobacco Use  Smoking Status Never Smoker  Smokeless Tobacco Never Used     Counseling given: No   Clinical Intake:  Pre-visit preparation completed: Yes  Pain : No/denies pain Pain Score: 0-No pain     Nutritional Status: BMI > 30  Obese Nutritional Risks: None Diabetes: No  How often do you need to have someone help you when you read instructions, pamphlets, or other written materials from your doctor or pharmacy?: 1 - Never What is the last grade level you completed in school?: 12th grade  Interpreter Needed?: No  Comments: pt lives with spouse Information entered by :: LPinson, LPN  Past Medical History:  Diagnosis Date  . Allergy     . Arthritis    hands  . Diverticulosis of colon 09/15/2006  . Dysphagia    per pt, having difficulty over last few months  . Hiatal hernia   . Nasal polyps   . Nephrolithiasis    PSA per Dr. Serita Butcher   Past Surgical History:  Procedure Laterality Date  . APPENDECTOMY  1988  . CARPAL TUNNEL RELEASE Left   . Left knee repair  1970's  . LITHOTRIPSY     twice per pt  . right knee surgery     arthroscopic  . Sinus surgery for polyps     Family History  Problem Relation Age of Onset  . Heart disease Mother   . Prostate cancer Father   . Colon cancer Neg Hx    Social History   Socioeconomic History  . Marital status: Married    Spouse name: Not on file  . Number of children: 0  . Years of education: Not on file  . Highest education level: Not on file  Occupational History  . Occupation: Insurance account manager  Social Needs  . Financial resource strain: Not on file  . Food insecurity:    Worry: Not on file    Inability: Not on file  . Transportation needs:    Medical: Not on file    Non-medical: Not on file  Tobacco Use  . Smoking status: Never Smoker  . Smokeless tobacco: Never Used  Substance and Sexual Activity  . Alcohol use: Yes    Alcohol/week: 0.0 standard drinks    Comment: up to 4-5 per week, sometimes none  . Drug use: No  . Sexual activity: Not on file  Lifestyle  . Physical activity:    Days per week: Not on file    Minutes per session: Not on file  . Stress: Not on file  Relationships  . Social connections:    Talks on phone: Not on file    Gets together: Not on file    Attends religious service: Not on file    Active member of club or organization: Not on file    Attends meetings of clubs or organizations: Not on file    Relationship status: Not on file  Other Topics Concern  . Not on file  Social History Narrative   1 step-daughter, 1 grandson   Married 1980    Outpatient Encounter Medications as of 02/08/2018  Medication Sig  .  glucosamine-chondroitin 500-400 MG tablet Take 1 tablet by mouth 3 (three) times daily.  . multivitamin-lutein (OCUVITE-LUTEIN) CAPS capsule Take 1 capsule by mouth daily.  Marland Kitchen omeprazole (PRILOSEC) 20 MG capsule TAKE 1 CAPSULE BY MOUTH ONCE DAILY  . Phenylephrine HCl (SINEX REGULAR NA) Place into the nose. Use daily as needed  . sildenafil (REVATIO) 20 MG tablet 20 mg. Take 2 tablets by mouth as needed.  . [DISCONTINUED] predniSONE (DELTASONE) 20 MG tablet Take 2 a day for 5 days, then 1 a day for 5 days, with food. Don't take with aleve/ibuprofen.   Facility-Administered Encounter Medications as of 02/08/2018  Medication  . 0.9 %  sodium chloride infusion    Activities of Daily Living In your present state of health, do you have any difficulty performing the following activities: 02/08/2018  Hearing? N  Vision? N  Difficulty concentrating or making decisions? N  Walking or climbing stairs? Y  Dressing or bathing? N  Doing errands, shopping? N  Preparing Food and eating ? N  Using the Toilet? N  In the past six months, have you accidently leaked urine? N  Do you have problems with loss of bowel control? N  Managing your Medications? N  Managing your Finances? N  Housekeeping or managing your Housekeeping? N  Some recent data might be hidden    Patient Care Team: Tonia Ghent, MD as PCP - General (Family Medicine)   Assessment:   This is a routine wellness examination for [place].   Hearing Screening   125Hz  250Hz  500Hz  1000Hz  2000Hz  3000Hz  4000Hz  6000Hz  8000Hz   Right ear:   40 40 40  40    Left ear:   40 40 40  40      Visual Acuity Screening   Right eye Left eye Both eyes  Without correction: 20/20 20/20 20/20   With correction:       Exercise Activities and Dietary recommendations Current Exercise Habits: Home exercise routine, Type of exercise: strength training/weights, Time (Minutes): 45, Frequency (Times/Week): 3, Weekly Exercise (Minutes/Week): 135, Intensity:  Moderate, Exercise limited by: Other - see comments(joint pain)  Goals    . Increase physical activity     Starting 02/08/2018, I will continue to exercise for 30-45 minutes 3 days per week.        Fall Risk Fall Risk  02/08/2018 02/07/2017  Falls in the past year? No No   Depression Screen PHQ 2/9 Scores 02/08/2018 02/07/2017  PHQ - 2 Score 0 0  PHQ- 9  Score 0 0    Cognitive Function MMSE - Mini Mental State Exam 02/08/2018 02/07/2017  Orientation to time 5 5  Orientation to Place 5 5  Registration 3 3  Attention/ Calculation 0 0  Recall 3 3  Language- name 2 objects 0 0  Language- repeat 1 1  Language- follow 3 step command 3 3  Language- read & follow direction 0 0  Write a sentence 0 0  Copy design 0 0  Total score 20 20     PLEASE NOTE: A Mini-Cog screen was completed. Maximum score is 20. A value of 0 denotes this part of [place] MMSE was not completed or the patient failed this part of the Mini-Cog screening.   Mini-Cog Screening Orientation to Time - Max 5 pts Orientation to Place - Max 5 pts Registration - Max 3 pts Recall - Max 3 pts Language Repeat - Max 1 pts Language Follow 3 Step Command - Max 3 pts     Immunization History  Administered Date(s) Administered  . Influenza,inj,Quad PF,6+ Mos 02/14/2017  . Pneumococcal Conjugate-13 02/14/2017  . Td 03/12/2003  . Tdap 02/28/2017  . Zoster 11/01/2012    Screening Tests Health Maintenance  Topic Date Due  . INFLUENZA VACCINE  08/08/2018 (Originally 12/07/2017)  . PNA vac Low Risk Adult (2 of 2 - PPSV23) 02/14/2018  . COLONOSCOPY  02/10/2022  . TETANUS/TDAP  03/01/2027  . Hepatitis C Screening  Completed     Plan:   I have personally reviewed, addressed, and noted the following in the patient's chart:  A. Medical and social history B. Use of alcohol, tobacco or illicit drugs  C. Current medications and supplements D. Functional ability and status E.  Nutritional status F.  Physical  activity G. Advance directives H. List of other physicians I.  Hospitalizations, surgeries, and ER visits in previous 12 months J.  [place] to include hearing, vision, cognitive, depression L. Referrals and appointments - none  In addition, I have reviewed and discussed with patient certain preventive protocols, quality metrics, and best practice recommendations. A written personalized care plan for preventive services as well as general preventive health recommendations were provided to patient.  See attached scanned questionnaire for additional information.   Signed,   Lindell Noe, MHA, BS, LPN Health Coach

## 2018-02-13 DIAGNOSIS — G4733 Obstructive sleep apnea (adult) (pediatric): Secondary | ICD-10-CM | POA: Diagnosis not present

## 2018-02-15 ENCOUNTER — Encounter: Payer: Self-pay | Admitting: Family Medicine

## 2018-02-15 ENCOUNTER — Ambulatory Visit (INDEPENDENT_AMBULATORY_CARE_PROVIDER_SITE_OTHER): Payer: PPO | Admitting: Family Medicine

## 2018-02-15 VITALS — BP 130/90 | HR 60 | Temp 97.6°F | Ht 69.5 in | Wt 217.2 lb

## 2018-02-15 DIAGNOSIS — N529 Male erectile dysfunction, unspecified: Secondary | ICD-10-CM

## 2018-02-15 DIAGNOSIS — R739 Hyperglycemia, unspecified: Secondary | ICD-10-CM | POA: Diagnosis not present

## 2018-02-15 DIAGNOSIS — M25569 Pain in unspecified knee: Secondary | ICD-10-CM

## 2018-02-15 DIAGNOSIS — Z0000 Encounter for general adult medical examination without abnormal findings: Secondary | ICD-10-CM

## 2018-02-15 DIAGNOSIS — Z23 Encounter for immunization: Secondary | ICD-10-CM

## 2018-02-15 DIAGNOSIS — R0981 Nasal congestion: Secondary | ICD-10-CM

## 2018-02-15 DIAGNOSIS — K219 Gastro-esophageal reflux disease without esophagitis: Secondary | ICD-10-CM

## 2018-02-15 DIAGNOSIS — Z7189 Other specified counseling: Secondary | ICD-10-CM

## 2018-02-15 MED ORDER — OMEPRAZOLE 20 MG PO CPDR: 20.0000 mg | DELAYED_RELEASE_CAPSULE | Freq: Every day | ORAL | 3 refills | Status: DC

## 2018-02-15 MED ORDER — SILDENAFIL CITRATE 20 MG PO TABS
100.0000 mg | ORAL_TABLET | Freq: Every day | ORAL | Status: DC | PRN
Start: 2018-02-15 — End: 2018-05-28

## 2018-02-15 NOTE — Assessment & Plan Note (Signed)
He has persistent sinus congestion with episodic phenylephrine use.   D/w pt about routine cautions, esp to avoid rebound sx.

## 2018-02-15 NOTE — Progress Notes (Signed)
He has persistent sinus congestion with episodic phenylephrine use.   D/w pt about routine cautions, esp to avoid rebound sx.    Dysphagia is clearly better with PPI and prev dilation.  No ADE on med.  These were the only interventions and his sx improved.  He failed trial off med.  Encouraged healthy diet at baseline.  He has had some occ R sided twinges of chest discomfort that were not exertional- they improved with exertion.  Likely diet related GERD component and we talked about making healthy choices that were less likely to induce sx.  He understood.   He has sig R knee pain with squatting. D/w pt about options.  He was prev told he was going to need L knee replacement.  He can call about ortho follow up.  I'll defer.  He agrees.    Tetanus 2018 zostavax done 2014.  d/w pt.  shingrix out of stock.   PNA 2019 Flu shot today.  PSA per urology- I'll defer.  he agrees.   Colonoscopy 2018. 5 year f/u.  Diet and exercise d/w pt.  Wife and daughter Ailene Ravel equally designated if patient were incapacitated.   D/w pt about eye clinic f/u, as a routine issue.    ED.  D/w pt about med use.  Some relief with med.  No ADE on med.    PMH and SH reviewed  Meds, vitals, and allergies reviewed.   ROS: Per HPI.  Unless specifically indicated otherwise in HPI, the patient denies:  General: fever. Eyes: acute vision changes ENT: sore throat Cardiovascular: exertional chest pain Respiratory: SOB GI: vomiting GU: dysuria Musculoskeletal: acute back pain Derm: acute rash Neuro: acute motor dysfunction Psych: worsening mood Endocrine: polydipsia Heme: bleeding Allergy: hayfever  GEN: nad, alert and oriented HEENT: mucous membranes moist NECK: supple w/o LA CV: rrr. PULM: ctab, no inc wob ABD: soft, +bs EXT: no edema SKIN: no acute rash

## 2018-02-15 NOTE — Assessment & Plan Note (Signed)
D/w pt about med use.  Some relief with med.  No ADE on med.

## 2018-02-15 NOTE — Assessment & Plan Note (Signed)
Wife and daughter Meredith equally designated if patient were incapacitated.   

## 2018-02-15 NOTE — Assessment & Plan Note (Addendum)
Dysphagia is clearly better with PPI and prev dilation.  No ADE on med.  These were the only interventions and his sx improved.  He failed trial off med.  Encouraged healthy diet at baseline.   >25 minutes spent in face to face time with patient, >50% spent in counselling or coordination of care. He'll update me as needed.  See above.

## 2018-02-15 NOTE — Patient Instructions (Addendum)
Flu and PNA shot today.  Get the new shingles shot (shingrix) when possible- we are out of stock.   Check with your insurance to see if they will cover the shingrix shot. Update me as needed.  Take care.  Glad to see you.

## 2018-02-15 NOTE — Assessment & Plan Note (Signed)
Tetanus 2018 zostavax done 2014.  d/w pt.  shingrix out of stock.   PNA 2019 Flu shot today.  PSA per urology- I'll defer.  he agrees.   Colonoscopy 2018. 5 year f/u.  Diet and exercise d/w pt.  Wife and daughter Ailene Ravel equally designated if patient were incapacitated.   D/w pt about eye clinic f/u, as a routine issue.

## 2018-02-15 NOTE — Assessment & Plan Note (Signed)
D/w pt about f/u with ortho.  I'll defer.  He agrees.

## 2018-02-15 NOTE — Assessment & Plan Note (Signed)
Labs d/w pt.  He wasn't fully fasting on the most recent labs.  D/w pt about diet and exercise.  He agrees.

## 2018-02-22 DIAGNOSIS — B078 Other viral warts: Secondary | ICD-10-CM | POA: Diagnosis not present

## 2018-02-26 ENCOUNTER — Ambulatory Visit (INDEPENDENT_AMBULATORY_CARE_PROVIDER_SITE_OTHER): Payer: PPO | Admitting: Family Medicine

## 2018-02-26 ENCOUNTER — Encounter: Payer: Self-pay | Admitting: Family Medicine

## 2018-02-26 ENCOUNTER — Ambulatory Visit: Payer: Self-pay

## 2018-02-26 VITALS — BP 122/80 | HR 68 | Temp 97.2°F | Ht 69.5 in | Wt 215.5 lb

## 2018-02-26 DIAGNOSIS — H8110 Benign paroxysmal vertigo, unspecified ear: Secondary | ICD-10-CM

## 2018-02-26 DIAGNOSIS — K219 Gastro-esophageal reflux disease without esophagitis: Secondary | ICD-10-CM

## 2018-02-26 MED ORDER — MECLIZINE HCL 25 MG PO TABS: 12.5000 mg | ORAL_TABLET | Freq: Three times a day (TID) | ORAL | 1 refills | Status: DC | PRN

## 2018-02-26 NOTE — Progress Notes (Signed)
Got up this AM and had vertigo with sensation of motion, not presyncope.  Sat down and then sx got some better.  Some sx when laying back down.  Sx continued intermittent through the AM.  Went to bed last night w/o vertigo.   No weakness. No falls.  Normal speech.  No ear pain.    H/o vertigo like this prev, last year.    He feels better now than he did this AM.  Minimal HA now.    He has some atypical R sided chest discomfort at rest, only after eating dinner.  Self resolves.  No exertional sx.    Meds, vitals, and allergies reviewed.   ROS: Per HPI unless specifically indicated in ROS section   GEN: nad, alert and oriented HEENT: mucous membranes moist, tympanic membranes within normal limits. NECK: supple w/o LA CV: rrr PULM: ctab, no inc wob ABD: soft, +bs EXT: no edema SKIN: no acute rash CN 2-12 wnl B, S/S/DTR wnl x4 Dix-Hallpike maneuver weakly positive where he has symptoms upon laying back and turning his head concurrently.  He does not have symptoms laying down with his head in a neutral position.

## 2018-02-26 NOTE — Patient Instructions (Addendum)
Likely BPV.  Use the bedside exercise and take meclizine as needed.   See if TUMS helps the chest discomfort.   Update me as needed.  Take care.  Glad to see you.

## 2018-02-26 NOTE — Telephone Encounter (Signed)
Pt c/o dizziness when standing the am. Pt stated that he stood up and got dizzy and had to sit back down because his head felt "swimmy headed." Pt stated that he felt like the room was spinning when laying in the bed. Pt stated that he had dizziness 1 year ago and was given a medication the helped with the symptoms. Pt denies weakness, numbness to one side of the of the body. Pt denies vision changes. Pt stated he is having nasal and head congestion. Pt denies fever, chest pain, vomiting, diarrhea or bleeding.  Care advice given and pt verbalized understanding. Appt scheduled for today at  3:15 pm with Dr Damita Dunnings. Reason for Disposition . [1] MILD dizziness (e.g., walking normally) AND [2] has been evaluated by physician for this  Answer Assessment - Initial Assessment Questions 1. DESCRIPTION: "Describe your dizziness."     Felt swimmy headed 2. LIGHTHEADED: "Do you feel lightheaded?" (e.g., somewhat faint, woozy, weak upon standing)     yes 3. VERTIGO: "Do you feel like either you or the room is spinning or tilting?" (i.e. vertigo)     no 4. SEVERITY: "How bad is it?"  "Do you feel like you are going to faint?" "Can you stand and walk?"   - MILD - walking normally   - MODERATE - interferes with normal activities (e.g., work, school)    - SEVERE - unable to stand, requires support to walk, feels like passing out now.      mild 5. ONSET:  "When did the dizziness begin?"     This am  6. AGGRAVATING FACTORS: "Does anything make it worse?" (e.g., standing, change in head position)     Standing, and when laying in bed felt like the room is spinning 7. HEART RATE: "Can you tell me your heart rate?" "How many beats in 15 seconds?"  (Note: not all patients can do this)       Pt cannot perform 8. CAUSE: "What do you think is causing the dizziness?"     Pt does not know 9. RECURRENT SYMPTOM: "Have you had dizziness before?" If so, ask: "When was the last time?" "What happened that time?"     Yes- 1  year ago-took a medicine 10. OTHER SYMPTOMS: "Do you have any other symptoms?" (e.g., fever, chest pain, vomiting, diarrhea, bleeding)       Nasal and head congestion 11. PREGNANCY: "Is there any chance you are pregnant?" "When was your last menstrual period?"       n/a  Protocols used: DIZZINESS [facility]

## 2018-02-27 NOTE — Assessment & Plan Note (Signed)
He does not have exertional symptoms.  He will see if taking Tums makes any difference in the symptoms.  If he has worsening of symptoms or exertional trouble then he can let me know.  Likely GERD.

## 2018-02-27 NOTE — Assessment & Plan Note (Signed)
Likely BPV.  Pathophysiology discussed with patient.  Okay to use bedside exercises along with as needed meclizine with sedation caution.  Update me as needed.  He agrees.

## 2018-03-16 DIAGNOSIS — G4733 Obstructive sleep apnea (adult) (pediatric): Secondary | ICD-10-CM | POA: Diagnosis not present

## 2018-05-28 ENCOUNTER — Encounter: Payer: Self-pay | Admitting: Family Medicine

## 2018-05-28 ENCOUNTER — Ambulatory Visit (INDEPENDENT_AMBULATORY_CARE_PROVIDER_SITE_OTHER)
Admission: RE | Admit: 2018-05-28 | Discharge: 2018-05-28 | Disposition: A | Discharge status: Home / Self Care | Payer: PPO | Source: Ambulatory Visit | Attending: Family Medicine | Admitting: Family Medicine

## 2018-05-28 ENCOUNTER — Ambulatory Visit (INDEPENDENT_AMBULATORY_CARE_PROVIDER_SITE_OTHER): Payer: PPO | Admitting: Family Medicine

## 2018-05-28 VITALS — BP 130/74 | HR 64 | Temp 98.0°F | Ht 69.5 in | Wt 215.5 lb

## 2018-05-28 DIAGNOSIS — M545 Low back pain, unspecified: Secondary | ICD-10-CM

## 2018-05-28 MED ORDER — PREDNISONE 20 MG PO TABS: ORAL_TABLET | ORAL | 0 refills | Status: DC

## 2018-05-28 MED ORDER — HYDROCODONE-ACETAMINOPHEN 5-325 MG PO TABS: 1.0000 | ORAL_TABLET | Freq: Four times a day (QID) | ORAL | 0 refills | Status: DC | PRN

## 2018-05-28 NOTE — Patient Instructions (Signed)
Prednisone with food.  Hydrocodone if needed for pain, sedation caution.  Go to the lab on the way out.  We'll contact you with your xray report. Take care.  Glad to see you.

## 2018-05-28 NOTE — Progress Notes (Signed)
Back pain.  Was outside, sitting down, lifted R leg while sitting and then had sig pain.  Quick onset.  This was 2 days ago.  No radiation into the leg.  Only in the lower midline back.  Pain worse with lifting R leg.  Minimal pain now but clear flares of pain with certain movements.  No pain laying down.  No B/B.  Able to bear weight.  No FCNAVD.   This is similar to flare from 09/2017.  Has been taking aleve.    Meds, vitals, and allergies reviewed.   ROS: Per HPI unless specifically indicated in ROS section   ncat Mmm Neck supple, no LA rrr ctab Abd soft, not ttp.   Back not ttp now.   Either leg lift causes back pain but not leg pain.  No radicular pain on exam.  Able to bear weight. No rash in the lower back.

## 2018-05-29 ENCOUNTER — Encounter: Payer: Self-pay | Admitting: Family Medicine

## 2018-05-29 NOTE — Assessment & Plan Note (Signed)
Check plain films today given that he has recurrent symptoms.  Still okay for outpatient follow-up. Prednisone with food.  Routine steroid cautions discussed.  Has tolerated previously. Hydrocodone if needed for pain, sedation caution.  Routine opiate cautions discussed. See notes on imaging.  Update me as needed.  He agrees.

## 2018-07-02 DIAGNOSIS — N5201 Erectile dysfunction due to arterial insufficiency: Secondary | ICD-10-CM | POA: Diagnosis not present

## 2018-07-02 DIAGNOSIS — N401 Enlarged prostate with lower urinary tract symptoms: Secondary | ICD-10-CM | POA: Diagnosis not present

## 2018-07-02 DIAGNOSIS — N400 Enlarged prostate without lower urinary tract symptoms: Secondary | ICD-10-CM | POA: Diagnosis not present

## 2018-07-05 ENCOUNTER — Ambulatory Visit: Payer: PPO | Admitting: Pulmonary Disease

## 2018-07-05 ENCOUNTER — Encounter: Payer: Self-pay | Admitting: Pulmonary Disease

## 2018-07-05 VITALS — BP 128/80 | HR 67 | Ht 69.5 in | Wt 217.0 lb

## 2018-07-05 DIAGNOSIS — J310 Chronic rhinitis: Secondary | ICD-10-CM

## 2018-07-05 DIAGNOSIS — G4733 Obstructive sleep apnea (adult) (pediatric): Secondary | ICD-10-CM

## 2018-07-05 MED ORDER — TRIAMCINOLONE ACETONIDE 55 MCG/ACT NA AERO: 2.0000 | INHALATION_SPRAY | Freq: Every day | NASAL | 10 refills | Status: DC

## 2018-07-05 NOTE — Patient Instructions (Addendum)
Continue CPAP with sleep New prescription: Nasacort steroid nasal spray.  2 sprays per nostril daily Follow-up in 1 year.  Call sooner if needed

## 2018-07-08 NOTE — Progress Notes (Signed)
PULMONARY/SLEEP OFFICE FOLLOW-UP NOTE  Requesting MD/Service: Elsie Stain, M.D. Date of initial consultation: 01/12/17 Reason for consultation: Suspected sleep apnea  PT PROFILE: 73 y.o. male never smoker referred for evaluation of heavy snoring of many years duration with occasional witnessed apneas  DATA: PSG 02/21/17: Moderate to severe OSA with AHI 32/hour.  Recommended auto CPAP with pressure range of 5-20 cm H2O Compliance 04/15/17-05/14/17: Usage 30/30 days.  Greater than  4 hours 30/30 days  INTERVAL:  Last seen 05/15/2017.  No major events in the interim.  SUBJ:  This is a scheduled follow-up for sleep apnea.  He remains compliant with CPAP.  He continues to believe that it is significantly beneficial.  He denies significant daytime hypersomnolence.  He has no morning headache.  He does report occasional problems with nasal congestion making it difficult to tolerate the CPAP nasal mask.   Vitals:   07/05/18 1028 07/05/18 1032  BP:  128/80  Pulse:  67  SpO2:  98%  Weight: 217 lb (98.4 kg)   Height: 5' 9.5" (1.765 m)   Room air  EXAM:  Gen: NAD HEENT: NCAT, sclerae white Neck: No JVD Lungs: breath sounds full, no wheezes or other adventitious sounds Cardiovascular: RRR, no murmurs Abdomen: Soft, nontender, normal BS Ext: without clubbing, cyanosis, edema Neuro: grossly intact Skin: Limited exam, no lesions noted   DATA:   BMP Latest Ref Rng & Units 02/08/2018 02/07/2017 08/10/2012  Glucose 70 - 99 mg/dL 101(H) 108(H) 128(H)  BUN 6 - 23 mg/dL 16 16 19   Creatinine 0.40 - 1.50 mg/dL 1.09 1.07 1.07  Sodium 135 - 145 mEq/L 139 139 137  Potassium 3.5 - 5.1 mEq/L 4.0 4.3 3.7  Chloride 96 - 112 mEq/L 104 104 103  CO2 19 - 32 mEq/L 32 31 24  Calcium 8.4 - 10.5 mg/dL 9.0 9.3 9.1    CBC Latest Ref Rng & Units 08/10/2012 03/13/2012  WBC 4.0 - 10.5 K/uL 7.4 10.7(H)  Hemoglobin 13.0 - 17.0 g/dL 13.2 13.6  Hematocrit 39.0 - 52.0 % 38.0(L) 42.3  Platelets 150 - 400 K/uL 258  394.0    CXR: No new film  IMPRESSION:     ICD-10-CM   1. OSA (obstructive sleep apnea) G47.33   2. Chronic rhinitis J31.0     PLAN:  Continue CPAP with sleep New prescription: Nasacort steroid nasal spray.  2 sprays per nostril daily Follow-up in 1 year.  Call sooner if needed   Merton Border, MD PCCM service Mobile [phone] Pager [phone] 07/08/2018 3:57 PM

## 2019-02-15 ENCOUNTER — Other Ambulatory Visit: Payer: Self-pay | Admitting: Family Medicine

## 2019-02-15 ENCOUNTER — Other Ambulatory Visit (INDEPENDENT_AMBULATORY_CARE_PROVIDER_SITE_OTHER): Payer: PPO

## 2019-02-15 ENCOUNTER — Ambulatory Visit: Payer: PPO

## 2019-02-15 ENCOUNTER — Other Ambulatory Visit: Payer: Self-pay

## 2019-02-15 DIAGNOSIS — R739 Hyperglycemia, unspecified: Secondary | ICD-10-CM | POA: Diagnosis not present

## 2019-02-15 LAB — COMPREHENSIVE METABOLIC PANEL
ALT: 10 U/L (ref 0–53)
AST: 14 U/L (ref 0–37)
Albumin: 4.2 g/dL (ref 3.5–5.2)
Alkaline Phosphatase: 67 U/L (ref 39–117)
BUN: 22 mg/dL (ref 6–23)
CO2: 29 mEq/L (ref 19–32)
Calcium: 9.2 mg/dL (ref 8.4–10.5)
Chloride: 105 mEq/L (ref 96–112)
Creatinine, Ser: 1.08 mg/dL (ref 0.40–1.50)
GFR: 67.22 mL/min (ref >60.00)
Glucose, Bld: 99 mg/dL (ref 70–99)
Potassium: 4.4 mEq/L (ref 3.5–5.1)
Sodium: 139 mEq/L (ref 135–145)
Total Bilirubin: 0.5 mg/dL (ref 0.2–1.2)
Total Protein: 6.7 g/dL (ref 6.0–8.3)

## 2019-02-15 LAB — LIPID PANEL
Cholesterol: 180 mg/dL (ref 0–200)
HDL: 45.4 mg/dL (ref >39.00)
LDL Cholesterol: 117 mg/dL — ABNORMAL HIGH (ref 0–99)
NonHDL: 134.27
Total CHOL/HDL Ratio: 4
Triglycerides: 88 mg/dL (ref 0.0–149.0)
VLDL: 17.6 mg/dL (ref 0.0–40.0)

## 2019-02-19 ENCOUNTER — Encounter: Payer: Self-pay | Admitting: Family Medicine

## 2019-02-19 ENCOUNTER — Ambulatory Visit (INDEPENDENT_AMBULATORY_CARE_PROVIDER_SITE_OTHER): Payer: PPO | Admitting: Family Medicine

## 2019-02-19 ENCOUNTER — Other Ambulatory Visit: Payer: Self-pay

## 2019-02-19 VITALS — BP 126/70 | HR 58 | Temp 97.1°F | Ht 68.5 in | Wt 206.0 lb

## 2019-02-19 DIAGNOSIS — Z0000 Encounter for general adult medical examination without abnormal findings: Secondary | ICD-10-CM | POA: Diagnosis not present

## 2019-02-19 DIAGNOSIS — Z7189 Other specified counseling: Secondary | ICD-10-CM

## 2019-02-19 DIAGNOSIS — Z23 Encounter for immunization: Secondary | ICD-10-CM | POA: Diagnosis not present

## 2019-02-19 DIAGNOSIS — M25569 Pain in unspecified knee: Secondary | ICD-10-CM

## 2019-02-19 DIAGNOSIS — G4733 Obstructive sleep apnea (adult) (pediatric): Secondary | ICD-10-CM

## 2019-02-19 DIAGNOSIS — R0981 Nasal congestion: Secondary | ICD-10-CM

## 2019-02-19 NOTE — Progress Notes (Signed)
I have personally reviewed the Medicare Annual Wellness questionnaire and have noted 1. The patient's medical and social history 2. Their use of alcohol, tobacco or illicit drugs 3. Their current medications and supplements 4. The patient's functional ability including ADL's, fall risks, home safety risks and hearing or visual             impairment. 5. Diet and physical activities 6. Evidence for depression or mood disorders  The patients weight, height, BMI have been recorded in the chart and visual acuity is per eye clinic.  I have made referrals, counseling and provided education to the patient based review of the above and I have provided the pt with a written personalized care plan for preventive services.  Provider list updated- see scanned forms.  Routine anticipatory guidance given to patient.  See health maintenance. The possibility exists that previously documented standard health maintenance information may have been brought forward from a previous encounter into this note.  If needed, that same information has been updated to reflect the current situation based on today's encounter.    Flu 2020 Shingles discussed with PNA up-to-date Tetanus 2018  colon cancer screening 2018 Prostate cancer screening per urology, I'll defer, patient agrees.  Advance directive - Wife and daughter Ailene Ravel equally designated if patient were incapacitated.   Cognitive function addressed- see scanned forms- and if abnormal then additional documentation follows.   Labs improved, likely from diet and exercise, d/w pt.    He is back lifting weights on a machine and his joint pain is some better, unclear if from exercise vs supplements.  He wants to put off intervention for his knees if possible.    D/w pt about trial of daily nasacort to try to limit phenylephrine at night.  He has used phenylephrine prior to CPAP use for congestion, with relief.  OSA improved with CPAP, not snoring.  Compliant, used  nightly.  Getting about 6-8 hours of use at night.    PMH and SH reviewed  Meds, vitals, and allergies reviewed.   ROS: Per HPI.  Unless specifically indicated otherwise in HPI, the patient denies:  General: fever. Eyes: acute vision changes ENT: sore throat Cardiovascular: chest pain Respiratory: SOB GI: vomiting GU: dysuria Musculoskeletal: acute back pain Derm: acute rash Neuro: acute motor dysfunction Psych: worsening mood Endocrine: polydipsia Heme: bleeding Allergy: hayfever  GEN: nad, alert and oriented HEENT: ncat NECK: supple w/o LA CV: rrr. PULM: ctab, no inc wob ABD: soft, +bs EXT: no edema SKIN: no acute rash  Health Maintenance  Topic Date Due  . COLONOSCOPY  02/10/2022  . TETANUS/TDAP  03/01/2027  . INFLUENZA VACCINE  Completed  . Hepatitis C Screening  Completed  . PNA vac Low Risk Adult  Completed

## 2019-02-19 NOTE — Patient Instructions (Addendum)
Check with your insurance to see if they will cover the shingrix shot. Try nasacort daily and if you still need the phenylephrine.  If your CPAP doesn't seem to fit, you may need to get it adjusted.  That can happen with weight loss.   Take care.  Glad to see you.

## 2019-02-20 DIAGNOSIS — G4733 Obstructive sleep apnea (adult) (pediatric): Secondary | ICD-10-CM | POA: Insufficient documentation

## 2019-02-20 DIAGNOSIS — Z0000 Encounter for general adult medical examination without abnormal findings: Secondary | ICD-10-CM | POA: Insufficient documentation

## 2019-02-20 NOTE — Assessment & Plan Note (Signed)
D/w pt about trial of daily nasacort to try to limit phenylephrine at night.  He has used phenylephrine prior to CPAP use for congestion, with relief.  OSA improved with CPAP, not snoring.  Compliant, used nightly.  Getting about 6-8 hours of use at night.

## 2019-02-20 NOTE — Assessment & Plan Note (Signed)
  He is back lifting weights on a machine and his joint pain is some better, unclear if from exercise vs supplements.  He wants to put off intervention for his knees if possible.

## 2019-02-20 NOTE — Assessment & Plan Note (Signed)
Flu 2020 Shingles discussed with PNA up-to-date Tetanus 2018  colon cancer screening 2018 Prostate cancer screening per urology, I'll defer, patient agrees.  Advance directive - Wife and daughter Ailene Ravel equally designated if patient were incapacitated.   Cognitive function addressed- see scanned forms- and if abnormal then additional documentation follows.   Labs improved, likely from diet and exercise, d/w pt.

## 2019-02-20 NOTE — Assessment & Plan Note (Signed)
  Wife and daughter Meredith equally designated if patient were incapacitated.   

## 2019-02-20 NOTE — Assessment & Plan Note (Signed)
OSA improved with CPAP, not snoring.  Compliant, used nightly.  Getting about 6-8 hours of use at night.

## 2019-03-18 ENCOUNTER — Other Ambulatory Visit: Payer: Self-pay | Admitting: Family Medicine

## 2019-06-10 NOTE — Progress Notes (Signed)
Virtual Visit via Telephone Note  I connected with Trevor Ross on 06/10/19 at  9:00 AM EST by telephone and verified that I am speaking with the correct person using two identifiers.  Location: Patient: Home Provider: [place]   I discussed the limitations, risks, security and privacy concerns of performing an evaluation and management service by telephone and the availability of in person appointments. I also discussed with the patient that there may be a patient responsible charge related to this service. The patient expressed understanding and agreed to proceed.   History of Present Illness: 74 year old male, never smoked. PMH significant for OSA. Previous patient of Dr. Alva Garnet, last seen on 07/05/18. PSG 2018 showed moderate-severe OSA with AHI 32/hr. Maintained on auto CPAP 5-20cm h20.   06/12/2019 Patient contacted today for 1 year follow-up OSA. Patient is compliant with CPAP uses. No reported problems with machine or mask. Uses nasal mask. Feels his pressure setting is too high. Sleeping through the night well, no reported daytime fatigue or somnolence. Continue to follow up with PCP for regular check ups. DME company is [employer].   [facility] download 05/12/19-06/10/19: Average usage 30/30 days; 100% >4 hours Pressure 5-10cm h20 (9.7- 95%) Leaks- 31.6L/min AHI 1.3  Observations/Objective:  - No shortness of breath, wheezing or cough noted during phone conversation  Assessment and Plan:  OSA: 100% compliant with CPAP and reports benefit from use Pressure change 8cm h20 d/t comfort  Renew CPAP supplies with [employer]  Advised not to drive if experiencing excessive daytime fatigue or somnolnce  Follow Up Instructions:   - 1 year FU with Dr. Halford Chessman   I discussed the assessment and treatment plan with the patient. The patient was provided an opportunity to ask questions and all were answered. The patient agreed with the plan and demonstrated an understanding of the  instructions.   The patient was advised to call back or seek an in-person evaluation if the symptoms worsen or if the condition fails to improve as anticipated.  I provided 18 minutes of non-face-to-face time during this encounter.   Martyn Ehrich, NP

## 2019-06-12 ENCOUNTER — Encounter: Payer: Self-pay | Admitting: Primary Care

## 2019-06-12 ENCOUNTER — Ambulatory Visit (INDEPENDENT_AMBULATORY_CARE_PROVIDER_SITE_OTHER): Payer: PPO | Admitting: Primary Care

## 2019-06-12 DIAGNOSIS — G4733 Obstructive sleep apnea (adult) (pediatric): Secondary | ICD-10-CM | POA: Diagnosis not present

## 2019-06-12 NOTE — Progress Notes (Signed)
Reviewed and agree with assessment/plan.   Skyelar Halliday, MD [facility] 05/04/2016, 12:24 PM Pager:  [phone]  

## 2019-06-12 NOTE — Patient Instructions (Addendum)
Pleasure speaking with you Trevor Ross  Recommendations: Continue wearing CPAP every night every 4-6 hours or more Do not drive if experiencing excessive daytime fatigue or somnolence  Orders: Renew CPAP supplies with [employer]  Change pressure setting to 8cm h20 Download in 1 month  Follow-up 1 year with Trevor Ross or sooner if needed    Sleep Apnea Sleep apnea affects breathing during sleep. It causes breathing to stop for a short time or to become shallow. It can also increase the risk of:  Heart attack.  Stroke.  Being very overweight (obese).  Diabetes.  Heart failure.  Irregular heartbeat. The goal of treatment is to help you breathe normally again. What are the causes? There are three kinds of sleep apnea:  Obstructive sleep apnea. This is caused by a blocked or collapsed airway.  Central sleep apnea. This happens when the brain does not send the right signals to the muscles that control breathing.  Mixed sleep apnea. This is a combination of obstructive and central sleep apnea. The most common cause of this condition is a collapsed or blocked airway. This can happen if:  Your throat muscles are too relaxed.  Your tongue and tonsils are too large.  You are overweight.  Your airway is too small. What increases the risk?  Being overweight.  Smoking.  Having a small airway.  Being older.  Being male.  Drinking alcohol.  Taking medicines to calm yourself (sedatives or tranquilizers).  Having family members with the condition. What are the signs or symptoms?  Trouble staying asleep.  Being sleepy or tired during the day.  Getting angry a lot.  Loud snoring.  Headaches in the morning.  Not being able to focus your mind (concentrate).  Forgetting things.  Less interest in sex.  Mood swings.  Personality changes.  Feelings of sadness (depression).  Waking up a lot during the night to pee (urinate).  Dry mouth.  Sore throat. How is  this diagnosed?  Your medical history.  A physical exam.  A test that is done when you are sleeping (sleep study). The test is most often done in a sleep lab but may also be done at home. How is this treated?   Sleeping on your side.  Using a medicine to get rid of mucus in your nose (decongestant).  Avoiding the use of alcohol, medicines to help you relax, or certain pain medicines (narcotics).  Losing weight, if needed.  Changing your diet.  Not smoking.  Using a machine to open your airway while you sleep, such as: ? An oral appliance. This is a mouthpiece that shifts your lower jaw forward. ? A CPAP device. This device blows air through a mask when you breathe out (exhale). ? An EPAP device. This has valves that you put in each nostril. ? A BPAP device. This device blows air through a mask when you breathe in (inhale) and breathe out.  Having surgery if other treatments do not work. It is important to get treatment for sleep apnea. Without treatment, it can lead to:  High blood pressure.  Coronary artery disease.  In men, not being able to have an erection (impotence).  Reduced thinking ability. Follow these instructions at home: Lifestyle  Make changes that your doctor recommends.  Eat a healthy diet.  Lose weight if needed.  Avoid alcohol, medicines to help you relax, and some pain medicines.  Do not use any products that contain nicotine or tobacco, such as cigarettes, e-cigarettes, and chewing  tobacco. If you need help quitting, ask your doctor. General instructions  Take over-the-counter and prescription medicines only as told by your doctor.  If you were given a machine to use while you sleep, use it only as told by your doctor.  If you are having surgery, make sure to tell your doctor you have sleep apnea. You may need to bring your device with you.  Keep all follow-up visits as told by your doctor. This is important. Contact a doctor if:  The  machine that you were given to use during sleep bothers you or does not seem to be working.  You do not get better.  You get worse. Get help right away if:  Your chest hurts.  You have trouble breathing in enough air.  You have an uncomfortable feeling in your back, arms, or stomach.  You have trouble talking.  One side of your body feels weak.  A part of your face is hanging down. These symptoms may be an emergency. Do not wait to see if the symptoms will go away. Get medical help right away. Call your local emergency services (911 in the [place] Do not drive yourself to the hospital. Summary  This condition affects breathing during sleep.  The most common cause is a collapsed or blocked airway.  The goal of treatment is to help you breathe normally while you sleep. This information is not intended to replace advice given to you by your health care provider. Make sure you discuss any questions you have with your health care provider. Document Revised: 02/09/2018 Document Reviewed: 12/19/2017 Elsevier Patient Education  [place].

## 2019-06-20 DIAGNOSIS — Z961 Presence of intraocular lens: Secondary | ICD-10-CM | POA: Diagnosis not present

## 2019-06-20 DIAGNOSIS — H35372 Puckering of macula, left eye: Secondary | ICD-10-CM | POA: Diagnosis not present

## 2019-07-05 ENCOUNTER — Ambulatory Visit: Payer: PPO | Attending: Internal Medicine

## 2019-07-05 DIAGNOSIS — Z23 Encounter for immunization: Secondary | ICD-10-CM | POA: Insufficient documentation

## 2019-07-05 NOTE — Progress Notes (Signed)
   Covid-19 Vaccination Clinic  Name:  ZADIN KNOTTS    MRN: [id number] DOB: [birth year]  07/05/2019  Mr. Vosburgh was observed post Covid-19 immunization for 15 minutes without incidence. He was provided with Vaccine Information Sheet and instruction to access the V-Safe system.   Mr. Puentes was instructed to call 911 with any severe reactions post vaccine: Marland Kitchen Difficulty breathing  . Swelling of your face and throat  . A fast heartbeat  . A bad rash all over your body  . Dizziness and weakness    Immunizations Administered    Name Date Dose VIS Date Route   Pfizer COVID-19 Vaccine 07/05/2019 10:16 AM 0.3 mL 04/19/2019 Intramuscular   Manufacturer: [place]   Lot: [id number]   [place]: [id number]

## 2019-07-30 ENCOUNTER — Ambulatory Visit: Payer: PPO | Attending: Internal Medicine

## 2019-07-30 DIAGNOSIS — Z23 Encounter for immunization: Secondary | ICD-10-CM

## 2019-07-30 NOTE — Progress Notes (Signed)
   Covid-19 Vaccination Clinic  Name:  Trevor Ross    MRN: [id number] DOB: [birth year]  07/30/2019  Trevor Ross was observed post Covid-19 immunization for 15 minutes without incident. He was provided with Vaccine Information Sheet and instruction to access the V-Safe system.   Trevor Ross was instructed to call 911 with any severe reactions post vaccine: Marland Kitchen Difficulty breathing  . Swelling of face and throat  . A fast heartbeat  . A bad rash all over body  . Dizziness and weakness   Immunizations Administered    Name Date Dose VIS Date Route   Pfizer COVID-19 Vaccine 07/30/2019 11:47 AM 0.3 mL 04/19/2019 Intramuscular   Manufacturer: [place]   Lot: [id number]   [place]: [id number]

## 2019-08-17 DIAGNOSIS — R300 Dysuria: Secondary | ICD-10-CM | POA: Diagnosis not present

## 2019-08-17 DIAGNOSIS — N200 Calculus of kidney: Secondary | ICD-10-CM | POA: Diagnosis not present

## 2019-08-19 DIAGNOSIS — G4733 Obstructive sleep apnea (adult) (pediatric): Secondary | ICD-10-CM | POA: Diagnosis not present

## 2019-08-20 DIAGNOSIS — N201 Calculus of ureter: Secondary | ICD-10-CM | POA: Diagnosis not present

## 2019-08-21 DIAGNOSIS — N201 Calculus of ureter: Secondary | ICD-10-CM | POA: Diagnosis not present

## 2019-10-04 ENCOUNTER — Encounter: Payer: Self-pay | Admitting: Family Medicine

## 2019-10-04 ENCOUNTER — Ambulatory Visit (INDEPENDENT_AMBULATORY_CARE_PROVIDER_SITE_OTHER): Payer: PPO | Admitting: Family Medicine

## 2019-10-04 ENCOUNTER — Other Ambulatory Visit: Payer: Self-pay

## 2019-10-04 ENCOUNTER — Ambulatory Visit (INDEPENDENT_AMBULATORY_CARE_PROVIDER_SITE_OTHER)
Admission: RE | Admit: 2019-10-04 | Discharge: 2019-10-04 | Disposition: A | Discharge status: Home / Self Care | Payer: PPO | Source: Ambulatory Visit | Attending: Family Medicine | Admitting: Family Medicine

## 2019-10-04 VITALS — BP 128/82 | HR 70 | Temp 97.5°F | Ht 68.5 in | Wt 207.8 lb

## 2019-10-04 DIAGNOSIS — M79671 Pain in right foot: Secondary | ICD-10-CM

## 2019-10-04 NOTE — Patient Instructions (Signed)
Wear the post op shoe when weight bearing but not when driving.  Let me know how you feel next week and we'll update you about the xray report.  Take care.  Glad to see you.

## 2019-10-04 NOTE — Progress Notes (Signed)
This visit occurred during the SARS-CoV-2 public health emergency.  Safety protocols were in place, including screening questions prior to the visit, additional usage of staff PPE, and extensive cleaning of exam room while observing appropriate contact time as indicated for disinfecting solutions.  He had his covid vaccine, d/w pt.    R ankle pain.  Thought he "turned it" prev.  Pain walking.  Less pain at rest.  Puffy laterally.  It was not getting totally better so he came in for evaluation.  Meds, vitals, and allergies reviewed.   ROS: Per HPI unless specifically indicated in ROS section   nad Able to bear weight. He has some right ankle puffiness on the lateral side without bruising.  Medial and lateral malleolar not tender.  Achilles not tender.  Not tender to palpation on the plantar side of the foot ttp near prox 5th MT, laterally. Normal dorsalis pedis pulse on the right foot. No bruising otherwise. Distally neurovascular intact.  X-rays reviewed and discussed with patient at office visit.  Felt better in post op shoe

## 2019-10-07 DIAGNOSIS — M79671 Pain in right foot: Secondary | ICD-10-CM | POA: Insufficient documentation

## 2019-10-07 NOTE — Assessment & Plan Note (Signed)
The concern was for proximal metatarsal fracture based on his history but this is not evident on the x-ray and I talked with patient about options.  I did not have a cam walker that would fit his foot but we did have a postop shoe that was appropriately sized.  He did feel better in the postop shoe.  In his particular case either CAM Walker or postop shoe would be reasonable, assuming he felt better when he was wearing it.  He could have had a soft tissue injury near the proximal fifth, but as long as he feels well in the postop shoe then I would wear it for now and then gradually wean out.  Routine cautions given to patient.  If he has any increasing pain then we can always reimage him in the future.  He agrees with plan.

## 2019-10-28 ENCOUNTER — Ambulatory Visit (INDEPENDENT_AMBULATORY_CARE_PROVIDER_SITE_OTHER): Payer: PPO

## 2019-10-28 ENCOUNTER — Other Ambulatory Visit: Payer: Self-pay

## 2019-10-28 ENCOUNTER — Encounter: Payer: Self-pay | Admitting: Podiatry

## 2019-10-28 ENCOUNTER — Ambulatory Visit: Payer: PPO | Admitting: Podiatry

## 2019-10-28 ENCOUNTER — Other Ambulatory Visit: Payer: Self-pay | Admitting: Podiatry

## 2019-10-28 DIAGNOSIS — S93491A Sprain of other ligament of right ankle, initial encounter: Secondary | ICD-10-CM | POA: Diagnosis not present

## 2019-10-28 DIAGNOSIS — M7671 Peroneal tendinitis, right leg: Secondary | ICD-10-CM

## 2019-10-28 DIAGNOSIS — M7751 Other enthesopathy of right foot: Secondary | ICD-10-CM

## 2019-10-28 MED ORDER — METHYLPREDNISOLONE 4 MG PO TBPK: ORAL_TABLET | ORAL | 0 refills | Status: DC

## 2019-10-28 NOTE — Progress Notes (Signed)
Subjective:  Patient ID: Trevor Ross, male    DOB: [birth year],  MRN: [id number] HPI Chief Complaint  Patient presents with  . Ankle Pain    Patient presents today for right ankle pain and swelling x 1 month.  He says he remembers turning ankle about 1 month ago, but didn't really bother him.  Since then his ankle has been swelling, worse by end of day.  He says it doens't hurt that much, the swelling bothers him.  He has used ice and Aleve for relief    74 y.o. male presents with the above complaint.   ROS: Denies fever chills nausea vomiting muscle aches pains calf pain back pain chest pain shortness of breath.  Back pain chest pain shortness of breath denied.  Past Medical History:  Diagnosis Date  . Allergy   . Arthritis    hands  . Diverticulosis of colon 09/15/2006  . Dysphagia   . Hiatal hernia   . Nasal polyps   . Nephrolithiasis    PSA per Dr. Serita Butcher  . OSA (obstructive sleep apnea)    Past Surgical History:  Procedure Laterality Date  . APPENDECTOMY  1988  . CARPAL TUNNEL RELEASE Left   . CATARACT EXTRACTION Bilateral   . Left knee repair  1970's  . LITHOTRIPSY     twice per pt  . right knee surgery     arthroscopic  . Sinus surgery for polyps      Current Outpatient Medications:  Marland Kitchen  Glucosamine-Chondroit-Vit C-Mn (GLUCOSAMINE 1500 COMPLEX) CAPS, Take by mouth daily., Disp: , Rfl:  .  methylPREDNISolone (MEDROL DOSEPAK) 4 MG TBPK tablet, 6 day dose pack - take as directed, Disp: 21 tablet, Rfl: 0 .  multivitamin-lutein (OCUVITE-LUTEIN) CAPS capsule, Take 1 capsule by mouth daily., Disp: , Rfl:  .  omeprazole (PRILOSEC) 20 MG capsule, TAKE 1 CAPSULE BY MOUTH ONCE DAILY, Disp: 90 capsule, Rfl: 2 .  Phenylephrine HCl (SINEX REGULAR NA), Place into the nose. Use daily as needed, Disp: , Rfl:  .  sildenafil (VIAGRA) 100 MG tablet, Take 100 mg by mouth daily as needed for erectile dysfunction., Disp: , Rfl:   No Known Allergies Review of Systems Objective:    There were no vitals filed for this visit.  General: Well developed, nourished, in no acute distress, alert and oriented x3   Dermatological: Skin is warm, dry and supple bilateral. Nails x 10 are well maintained; remaining integument appears unremarkable at this time. There are no open sores, no preulcerative lesions, no rash or signs of infection present.  Vascular: Dorsalis Pedis artery and Posterior Tibial artery pedal pulses are 2/4 bilateral with immedate capillary fill time. Pedal hair growth present. No varicosities and no lower extremity edema present bilateral.   Neruologic: Grossly intact via light touch bilateral. Vibratory intact via tuning fork bilateral. Protective threshold with Semmes Wienstein monofilament intact to all pedal sites bilateral. Patellar and Achilles deep tendon reflexes 2+ bilateral. No Babinski or clonus noted bilateral.   Musculoskeletal: No gross boney pedal deformities bilateral. No pain, crepitus, or limitation noted with foot and ankle range of motion bilateral. Muscular strength 5/5 in all groups tested bilateral.  He has pain on palpation of the peroneal tendons inferior to the lateral malleolus coursing to the arcuate portion of the cuboid and the fifth metatarsal base.  He has some tenderness on the lateral aspect of the leg along the peroneal muscles and tendons.  He also has tenderness on palpation of the anterior  talofibular ligament and the sinus tarsi area.  There is no ecchymosis but there is some swelling present.  Gait: Unassisted, Nonantalgic.    Radiographs:  Radiographs taken today demonstrate an osseously mature individual with soft tissue swelling around the peroneal tendons which was the ankle view that we took today.  However there also visible on the radiographs taken by Dr. Josefine Class office from [facility].  No fractures are identified no acute findings noted.  Incidental finding is calcific sclerosis of the dorsalis pedis  and the tibial artery from the leg into the foot visible not only on the ankle views but also on the foot views.  Assessment & Plan:   Assessment: Peroneal tendinitis lateral ankle sprain right  Plan: Discussed etiology pathology conservative versus surgical therapies at this point will inform Dr. Damita Dunnings of the calcific sclerosis.  Placed patient in a compression anklet for the edema as well as the Tri-Lock brace for support.  I would like to follow-up with him in about a month to 6 weeks to make sure he is doing well.  We did discuss RICE therapy with rest ice compression elevation.  If not improved considerably by next visit physical therapy will be necessary or possible MRI.       Yvonna Brun T. [place], [place]

## 2019-11-14 ENCOUNTER — Emergency Department (HOSPITAL_COMMUNITY): Payer: PPO

## 2019-11-14 ENCOUNTER — Emergency Department (HOSPITAL_COMMUNITY)
Admission: EM | Admit: 2019-11-14 | Discharge: 2019-11-14 | Disposition: A | Discharge status: Home / Self Care | Payer: PPO | Attending: Emergency Medicine | Admitting: Emergency Medicine

## 2019-11-14 ENCOUNTER — Encounter (HOSPITAL_COMMUNITY): Payer: Self-pay

## 2019-11-14 ENCOUNTER — Other Ambulatory Visit: Payer: Self-pay

## 2019-11-14 DIAGNOSIS — Z79899 Other long term (current) drug therapy: Secondary | ICD-10-CM | POA: Insufficient documentation

## 2019-11-14 DIAGNOSIS — Y999 Unspecified external cause status: Secondary | ICD-10-CM | POA: Diagnosis not present

## 2019-11-14 DIAGNOSIS — Y92007 Garden or yard of unspecified non-institutional (private) residence as the place of occurrence of the external cause: Secondary | ICD-10-CM | POA: Insufficient documentation

## 2019-11-14 DIAGNOSIS — S4992XA Unspecified injury of left shoulder and upper arm, initial encounter: Secondary | ICD-10-CM | POA: Diagnosis present

## 2019-11-14 DIAGNOSIS — X509XXA Other and unspecified overexertion or strenuous movements or postures, initial encounter: Secondary | ICD-10-CM | POA: Diagnosis not present

## 2019-11-14 DIAGNOSIS — Y93H2 Activity, gardening and landscaping: Secondary | ICD-10-CM | POA: Diagnosis not present

## 2019-11-14 DIAGNOSIS — M25512 Pain in left shoulder: Secondary | ICD-10-CM | POA: Diagnosis not present

## 2019-11-14 DIAGNOSIS — S46012A Strain of muscle(s) and tendon(s) of the rotator cuff of left shoulder, initial encounter: Secondary | ICD-10-CM | POA: Diagnosis not present

## 2019-11-14 DIAGNOSIS — R0789 Other chest pain: Secondary | ICD-10-CM | POA: Insufficient documentation

## 2019-11-14 DIAGNOSIS — S46912A Strain of unspecified muscle, fascia and tendon at shoulder and upper arm level, left arm, initial encounter: Secondary | ICD-10-CM

## 2019-11-14 LAB — BASIC METABOLIC PANEL WITH GFR
Anion gap: 9 (ref 5–15)
BUN: 15 mg/dL (ref 8–23)
CO2: 25 mmol/L (ref 22–32)
Calcium: 9.1 mg/dL (ref 8.9–10.3)
Chloride: 104 mmol/L (ref 98–111)
Creatinine, Ser: 1.1 mg/dL (ref 0.61–1.24)
GFR calc Af Amer: >60 mL/min (ref >60)
GFR calc non Af Amer: >60 mL/min (ref >60)
Glucose, Bld: 150 mg/dL — ABNORMAL HIGH (ref 70–99)
Potassium: 3.8 mmol/L (ref 3.5–5.1)
Sodium: 138 mmol/L (ref 135–145)

## 2019-11-14 LAB — CBC
HCT: 41.5 % (ref 39.0–52.0)
Hemoglobin: 13.9 g/dL (ref 13.0–17.0)
MCH: 31.8 pg (ref 26.0–34.0)
MCHC: 33.5 g/dL (ref 30.0–36.0)
MCV: 95 fL (ref 80.0–100.0)
Platelets: 275 10*3/uL (ref 150–400)
RBC: 4.37 MIL/uL (ref 4.22–5.81)
RDW: 12.4 % (ref 11.5–15.5)
WBC: 10.7 10*3/uL — ABNORMAL HIGH (ref 4.0–10.5)
nRBC: 0 % (ref 0.0–0.2)

## 2019-11-14 LAB — TROPONIN I (HIGH SENSITIVITY)
Troponin I (High Sensitivity): 5 ng/L (ref <18)
Troponin I (High Sensitivity): 5 ng/L (ref <18)

## 2019-11-14 MED ORDER — HYDROCODONE-ACETAMINOPHEN 5-325 MG PO TABS
1.0000 | ORAL_TABLET | Freq: Once | ORAL | Status: AC
  Administered 2019-11-14: 1 via ORAL
  Filled 2019-11-14: qty 1

## 2019-11-14 MED ORDER — METHOCARBAMOL 500 MG PO TABS
500.0000 mg | ORAL_TABLET | Freq: Two times a day (BID) | ORAL | 0 refills | Status: DC
Start: 2019-11-14 — End: 2019-11-18

## 2019-11-14 MED ORDER — SODIUM CHLORIDE 0.9% FLUSH: 3.0000 mL | Freq: Once | INTRAVENOUS | Status: DC

## 2019-11-14 MED ORDER — METHOCARBAMOL 500 MG PO TABS
500.0000 mg | ORAL_TABLET | Freq: Once | ORAL | Status: AC
  Administered 2019-11-14: 500 mg via ORAL
  Filled 2019-11-14: qty 1

## 2019-11-14 MED ORDER — HYDROCODONE-ACETAMINOPHEN 5-325 MG PO TABS: 1.0000 | ORAL_TABLET | Freq: Four times a day (QID) | ORAL | 0 refills | Status: DC | PRN

## 2019-11-14 NOTE — Progress Notes (Signed)
Orthopedic Tech Progress Note Patient Details:  Trevor Ross 1946/03/30 [id number]  Ortho Devices Type of Ortho Device: Shoulder immobilizer Ortho Device/Splint Location: LUE Ortho Device/Splint Interventions: Ordered, Application, Adjustment   Post Interventions Patient Tolerated: Well Instructions Provided: Care of device, Adjustment of device, Poper ambulation with device   Trevor Ross 11/14/2019, 4:02 PM

## 2019-11-14 NOTE — ED Provider Notes (Signed)
[facility] DEPARTMENT Provider Note   CSN: [id number] Arrival date & time: 11/14/19  0920     History No chief complaint on file.   Trevor Ross is a 74 y.o. male with a past medical history of arthritis, OSA presenting to the ED with a chief complaint of left-sided chest pain and shoulder pain.  States that 2 days ago he was mowing the lawn and is concerned that he may have strained a muscle in his left shoulder.  Yesterday started experiencing pain that was radiating to his left chest.  States that he continued to have the left shoulder pain that radiates down his arm.  He tried taking 4 Aleve yesterday with only minimal improvement in his symptoms.  Left shoulder pain is worse with any type of movements.  He denies any direct injuries or falls, prior shoulder surgeries.  Denies any shortness of breath, numbness in arms or legs, fever.  Reports nonproductive cough for the past few days.  Denies any leg swelling, history of DVT or PE, recent immobilization, history of MI, sick contacts with similar symptoms.  HPI     Past Medical History:  Diagnosis Date  . Allergy   . Arthritis    hands  . Diverticulosis of colon 09/15/2006  . Dysphagia   . Hiatal hernia   . Nasal polyps   . Nephrolithiasis    PSA per Dr. Serita Butcher  . OSA (obstructive sleep apnea)     Patient Active Problem List   Diagnosis Date Noted  . Right foot pain 10/07/2019  . Medicare annual wellness visit, subsequent 02/20/2019  . OSA (obstructive sleep apnea) 02/20/2019  . Erectile dysfunction 02/15/2018  . Nasal congestion 04/27/2017  . Healthcare maintenance 02/15/2017  . Advance care planning 02/15/2017  . GERD (gastroesophageal reflux disease) 02/15/2017  . Hyperglycemia 02/15/2017  . Snoring 12/07/2016  . Low back pain 09/29/2014  . Knee pain 05/29/2012  . Nephrolithiasis 04/28/2012  . BENIGN POSITIONAL VERTIGO 02/13/2009    Past Surgical History:  Procedure Laterality  Date  . APPENDECTOMY  1988  . CARPAL TUNNEL RELEASE Left   . CATARACT EXTRACTION Bilateral   . Left knee repair  1970's  . LITHOTRIPSY     twice per pt  . right knee surgery     arthroscopic  . Sinus surgery for polyps         Family History  Problem Relation Age of Onset  . Heart disease Mother   . Prostate cancer Father   . Colon cancer Neg Hx     Social History   Tobacco Use  . Smoking status: Never Smoker  . Smokeless tobacco: Never Used  Vaping Use  . Vaping Use: Never used  Substance Use Topics  . Alcohol use: Yes    Alcohol/week: 0.0 standard drinks    Comment: rare  . Drug use: No    Home Medications Prior to Admission medications   Medication Sig Start Date End Date Taking? Authorizing Provider  Glucosamine-Chondroit-Vit C-Mn (GLUCOSAMINE 1500 COMPLEX) CAPS Take 1 capsule by mouth daily.    Yes [provider]  multivitamin-lutein (OCUVITE-LUTEIN) CAPS capsule Take 1 capsule by mouth daily.   Yes [provider]  naproxen sodium (ALEVE) 220 MG tablet Take 440 mg by mouth as needed (pain).   Yes [provider]  omeprazole (PRILOSEC) 20 MG capsule TAKE 1 CAPSULE BY MOUTH ONCE DAILY Patient taking differently: Take 20 mg by mouth daily.  03/18/19  Yes Damita Dunnings,  Elveria Rising, MD  Phenylephrine HCl (SINEX REGULAR NA) Place into the nose. Use daily as needed   Yes [provider]  sildenafil (VIAGRA) 100 MG tablet Take 100 mg by mouth daily as needed for erectile dysfunction.   Yes [provider]  tamsulosin (FLOMAX) 0.4 MG CAPS capsule Take 0.4 mg by mouth daily.   Yes [provider]  HYDROcodone-acetaminophen (NORCO/VICODIN) 5-325 MG tablet Take 1 tablet by mouth every 6 (six) hours as needed. 11/14/19   Tyra Gural, PA-C  methocarbamol (ROBAXIN) 500 MG tablet Take 1 tablet (500 mg total) by mouth 2 (two) times daily. 11/14/19   Jaja Switalski, PA-C  methylPREDNISolone (MEDROL DOSEPAK) 4 MG TBPK tablet 6 day dose pack -  take as directed Patient not taking: Reported on 11/14/2019 10/28/19   Garrel Ridgel, DPM    Allergies    Patient has no known allergies.  Review of Systems   Review of Systems  Constitutional: Negative for appetite change, chills and fever.  HENT: Negative for ear pain, rhinorrhea, sneezing and sore throat.   Eyes: Negative for photophobia and visual disturbance.  Respiratory: Negative for cough, chest tightness, shortness of breath and wheezing.   Cardiovascular: Positive for chest pain. Negative for palpitations.  Gastrointestinal: Negative for abdominal pain, blood in stool, constipation, diarrhea, nausea and vomiting.  Genitourinary: Negative for dysuria, hematuria and urgency.  Musculoskeletal: Positive for arthralgias. Negative for myalgias.  Skin: Negative for rash.  Neurological: Negative for dizziness, weakness and light-headedness.    Physical Exam Updated Vital Signs BP (!) 143/91   Pulse (!) 57   Temp 98.5 F (36.9 C) (Oral)   Resp 16   Ht 5\' 11"  (1.803 m)   Wt 91.2 kg   SpO2 100%   BMI 28.03 kg/m   Physical Exam Vitals and nursing note reviewed.  Constitutional:      General: He is not in acute distress.    Appearance: He is well-developed.     Comments: Speaking in complete sentences without difficulty.  No signs of respiratory distress.  HENT:     Head: Normocephalic and atraumatic.     Nose: Nose normal.  Eyes:     General: No scleral icterus.       Right eye: No discharge.        Left eye: No discharge.     Conjunctiva/sclera: Conjunctivae normal.  Cardiovascular:     Rate and Rhythm: Normal rate and regular rhythm.     Heart sounds: Normal heart sounds. No murmur heard.  No friction rub. No gallop.   Pulmonary:     Effort: Pulmonary effort is normal. No respiratory distress.     Breath sounds: Normal breath sounds.  Abdominal:     General: Bowel sounds are normal. There is no distension.     Palpations: Abdomen is soft.     Tenderness: There is  no abdominal tenderness. There is no guarding.  Musculoskeletal:        General: Tenderness present. Normal range of motion.     Cervical back: Normal range of motion and neck supple.     Right lower leg: No edema.     Left lower leg: No edema.     Comments: Some tenderness palpation of the left shoulder.  Limited range of motion secondary to pain.  Compartments are soft.  Normal sensation to light touch.  2+ radial pulse noted bilaterally.  Skin:    General: Skin is warm and dry.  Findings: No rash.  Neurological:     Mental Status: He is alert.     Motor: No abnormal muscle tone.     Coordination: Coordination normal.     ED Results / Procedures / Treatments   Labs (all labs ordered are listed, but only abnormal results are displayed) Labs Reviewed  BASIC METABOLIC PANEL - Abnormal; Notable for the following components:      Result Value   Glucose, Bld 150 (*)    All other components within normal limits  CBC - Abnormal; Notable for the following components:   WBC 10.7 (*)    All other components within normal limits  TROPONIN I (HIGH SENSITIVITY)  TROPONIN I (HIGH SENSITIVITY)    EKG None  Radiology DG Chest 2 View  Result Date: 11/14/2019 CLINICAL DATA:  Left shoulder pain EXAM: CHEST - 2 VIEW COMPARISON:  11/28/2016 FINDINGS: The heart size and mediastinal contours are within normal limits. Both lungs are clear. The visualized skeletal structures are unremarkable. IMPRESSION: No active cardiopulmonary disease. Electronically Signed   By: Fidela Salisbury MD   On: 11/14/2019 09:56    Procedures Procedures (including critical care time)  Medications Ordered in ED Medications  sodium chloride flush (NS) 0.9 % injection 3 mL (has no administration in time range)  HYDROcodone-acetaminophen (NORCO/VICODIN) 5-325 MG per tablet 1 tablet (1 tablet Oral Given 11/14/19 1325)  methocarbamol (ROBAXIN) tablet 500 mg (500 mg Oral Given 11/14/19 1325)    ED Course  I have  reviewed the triage vital signs and the nursing notes.  Pertinent labs & imaging results that were available during my care of the patient were reviewed by me and considered in my medical decision making (see chart for details).    MDM Rules/Calculators/A&P                          74 year old male with past medical history of arthritis, OSA presenting to the ED with a chief complaint of left-sided chest pain and shoulder pain.  2 days ago was mowing the lawn and was concerned that he may have strained a muscle in his left shoulder.  Starting yesterday the pain started radiating to his left chest and down his left arm.  Denies any shortness of breath, leg swelling, hemoptysis or fever.  No prior shoulder surgeries.  No history of DVT, PE or MI, recent immobilization.  On exam there is some tenderness palpation of the left shoulder area with limited range of motion secondary to pain.  No erythema, edema or warmth of joint noted that concern me for infectious cause of symptoms.  Good distal pulses noted in bilateral upper extremities.  Lungs are clear to auscultation bilaterally.  No lower extremity edema, erythema or calf tenderness of concern me for DVT.  He is not tachycardic, tachypneic or hypoxic.  EKG shows normal sinus rhythm, no ischemic changes or arrhythmias.  Initial and delta troponin are both negative.  CBC and BMP are unremarkable.  Patient was given Norco and Robaxin here with improvement in his symptoms.  Suspect that symptoms are musculoskeletal in nature.  Doubt PE as a cause of his symptoms as he lacks risk factors and no evidence of DVT, he is not hypoxic or tachycardic.  Low risk for ACS based on his 2 reassuring normal troponins and normal EKG.  He is low risk by heart score.  We will have him follow-up with PCP, orthopedist, placed in sling and proximal with  range of motion.  Will give short course of pain medication, muscle relaxer and have him return for worsening symptoms. Patient  discussed with and seen by the attending, Dr. Roderic Palau.   Patient is hemodynamically stable, in NAD, and able to ambulate in the ED. Evaluation does not show pathology that would require ongoing emergent intervention or inpatient treatment. I explained the diagnosis to the patient. Pain has been managed and has no complaints prior to discharge. Patient is comfortable with above plan and is stable for discharge at this time. All questions were answered prior to disposition. Strict return precautions for returning to the ED were discussed. Encouraged follow up with PCP.   Prior to providing a prescription for a controlled substance, I independently reviewed the patient's recent prescription history on the [place]. The patient had no recent or regular prescriptions and was deemed appropriate for a brief, less than 3 day prescription of narcotic for acute analgesia.  An After Visit Summary was printed and given to the patient.   Portions of this note were generated with [employer]. Dictation errors may occur despite best attempts at proofreading.  Final Clinical Impression(s) / ED Diagnoses Final diagnoses:  Strain of left shoulder, initial encounter  Chest wall pain    Rx / DC Orders ED Discharge Orders         Ordered    HYDROcodone-acetaminophen (NORCO/VICODIN) 5-325 MG tablet  Every 6 hours PRN     Discontinue  Reprint     11/14/19 1518    methocarbamol (ROBAXIN) 500 MG tablet  2 times daily     Discontinue  Reprint     11/14/19 1518           Delia Heady, PA-C 11/14/19 1518    Milton Ferguson, MD 11/14/19 1630

## 2019-11-14 NOTE — Discharge Instructions (Addendum)
Take medications as needed to help with your symptoms. Follow-up with orthopedic specialist listed below as well as your primary care provider. Return to the ER if you start to experience worsening chest pain, shortness of breath, leg swelling, injuries or falls, numbness in arms or legs.

## 2019-11-14 NOTE — ED Triage Notes (Signed)
Patient complains of left anterior CP with pain to left shoulder radiating to left arm x 1 day.  Patient states that the pain intense in shoulder with any ROM

## 2019-11-18 ENCOUNTER — Encounter: Payer: Self-pay | Admitting: Family Medicine

## 2019-11-18 ENCOUNTER — Ambulatory Visit (INDEPENDENT_AMBULATORY_CARE_PROVIDER_SITE_OTHER): Payer: PPO | Admitting: Family Medicine

## 2019-11-18 ENCOUNTER — Other Ambulatory Visit: Payer: Self-pay

## 2019-11-18 VITALS — BP 118/76 | HR 66 | Temp 97.8°F | Ht 71.0 in | Wt 206.8 lb

## 2019-11-18 DIAGNOSIS — Z8249 Family history of ischemic heart disease and other diseases of the circulatory system: Secondary | ICD-10-CM

## 2019-11-18 DIAGNOSIS — M25519 Pain in unspecified shoulder: Secondary | ICD-10-CM

## 2019-11-18 MED ORDER — METHOCARBAMOL 500 MG PO TABS: 500.0000 mg | ORAL_TABLET | Freq: Two times a day (BID) | ORAL | 3 refills | Status: DC

## 2019-11-18 NOTE — Progress Notes (Signed)
This visit occurred during the SARS-CoV-2 public health emergency.  Safety protocols were in place, including screening questions prior to the visit, additional usage of staff PPE, and extensive cleaning of exam room while observing appropriate contact time as indicated for disinfecting solutions.  We talked about vascular calcifications seen on prev foot xray.  His foot is better in the meantime, using a compression stocking.    He lifted weights the night before he had shoulder/chest pain.  He had pain down the L arm and he went to ER for eval.  ER eval d/w pt.  He is better now with some reproduceable L shoulder pain.  His ROM is clearly better in the meantime. No SOB.  No CP except for twinges with ROM in the L upper chest wall.  He used hydrocodone at night along with prn robaxin for pain relief.  He couldn't tolerate shoulder sling use.    We talked about cardiac referral and further testing with stress test versus CT imaging.  He was worried about being able to complete an exercise treadmill test.  He has trouble jogging from knee pain.    Meds, vitals, and allergies reviewed.   ROS: Per HPI unless specifically indicated in ROS section   GEN: nad, alert and oriented HEENT: ncat NECK: supple w/o LA CV: rrr.  PULM: ctab, no inc wob ABD: soft, +bs EXT: no edema SKIN: no acute rash He has some reproducible discomfort on left shoulder range of motion.

## 2019-11-18 NOTE — Patient Instructions (Signed)
Don't change your meds for now and we'll call about getting you set up with cardiology.  Take care.  Glad to see you. Update me as needed.

## 2019-11-20 DIAGNOSIS — Z8249 Family history of ischemic heart disease and other diseases of the circulatory system: Secondary | ICD-10-CM | POA: Insufficient documentation

## 2019-11-20 DIAGNOSIS — M25519 Pain in unspecified shoulder: Secondary | ICD-10-CM | POA: Insufficient documentation

## 2019-11-20 NOTE — Assessment & Plan Note (Signed)
This looks to be musculoskeletal and incidental compared to his family history of cardiovascular disease.  He can use hydrocodone and Robaxin as needed in the meantime with routine cautions and he will update me as needed.  Discussed limiting heavy lifting in the meantime.

## 2019-11-20 NOTE — Assessment & Plan Note (Addendum)
Refer to cardiology given his family history and vascular calcifications noted in his foot.  We talked about cardiac referral and further testing with stress test versus CT imaging.  He was worried about being able to complete an exercise treadmill test.  He has trouble jogging from knee pain.  I would like cardiology input.  No change in medications at this point.  At least 30 minutes were devoted to patient care in this encounter (this can potentially include time spent reviewing the patient's file/history, interviewing and examining the patient, counseling/reviewing plan with patient, ordering referrals, ordering tests, reviewing relevant laboratory or x-ray data, and documenting the encounter).

## 2019-12-09 ENCOUNTER — Other Ambulatory Visit: Payer: Self-pay

## 2019-12-09 ENCOUNTER — Ambulatory Visit: Payer: PPO | Admitting: Podiatry

## 2019-12-09 ENCOUNTER — Encounter: Payer: Self-pay | Admitting: Podiatry

## 2019-12-09 DIAGNOSIS — M7671 Peroneal tendinitis, right leg: Secondary | ICD-10-CM | POA: Diagnosis not present

## 2019-12-09 NOTE — Progress Notes (Signed)
  Subjective:  Patient ID: Trevor Ross, male    DOB: [birth year],  MRN: [id number] HPI Chief Complaint  Patient presents with  . Ankle Pain    "I think its better, but it still hurts in the afternoons when I have been on it alot"    74 y.o. male presents with the above complaint.     Past Medical History:  Diagnosis Date  . Allergy   . Arthritis    hands  . Diverticulosis of colon 09/15/2006  . Dysphagia   . Hiatal hernia   . Nasal polyps   . Nephrolithiasis    PSA per Dr. Serita Butcher  . OSA (obstructive sleep apnea)    Past Surgical History:  Procedure Laterality Date  . APPENDECTOMY  1988  . CARPAL TUNNEL RELEASE Left   . CATARACT EXTRACTION Bilateral   . Left knee repair  1970's  . LITHOTRIPSY     twice per pt  . right knee surgery     arthroscopic  . Sinus surgery for polyps      Current Outpatient Medications:  Marland Kitchen  Glucosamine-Chondroit-Vit C-Mn (GLUCOSAMINE 1500 COMPLEX) CAPS, Take 1 capsule by mouth daily. , Disp: , Rfl:  .  HYDROcodone-acetaminophen (NORCO/VICODIN) 5-325 MG tablet, Take 1 tablet by mouth every 6 (six) hours as needed., Disp: 6 tablet, Rfl: 0 .  methocarbamol (ROBAXIN) 500 MG tablet, Take 1 tablet (500 mg total) by mouth 2 (two) times daily., Disp: 30 tablet, Rfl: 3 .  multivitamin-lutein (OCUVITE-LUTEIN) CAPS capsule, Take 1 capsule by mouth daily., Disp: , Rfl:  .  naproxen sodium (ALEVE) 220 MG tablet, Take 440 mg by mouth as needed (pain)., Disp: , Rfl:  .  omeprazole (PRILOSEC) 20 MG capsule, TAKE 1 CAPSULE BY MOUTH ONCE DAILY (Patient taking differently: Take 20 mg by mouth daily. ), Disp: 90 capsule, Rfl: 2 .  Phenylephrine HCl (SINEX REGULAR NA), Place into the nose. Use daily as needed, Disp: , Rfl:  .  sildenafil (VIAGRA) 100 MG tablet, Take 100 mg by mouth daily as needed for erectile dysfunction., Disp: , Rfl:  .  tamsulosin (FLOMAX) 0.4 MG CAPS capsule, Take 0.4 mg by mouth daily., Disp: , Rfl:   No Known Allergies Review of  Systems Objective:  There were no vitals filed for this visit.  General: Well developed, nourished, in no acute distress, alert and oriented x3   Dermatological: Skin is warm, dry and supple bilateral. Nails x 10 are well maintained; remaining integument appears unremarkable at this time. There are no open sores, no preulcerative lesions, no rash or signs of infection present.  Vascular: Dorsalis Pedis artery and Posterior Tibial artery pedal pulses are 2/4 bilateral with immedate capillary fill time. Pedal hair growth present. No varicosities and no lower extremity edema present bilateral.   Neruologic: Grossly intact via light touch bilateral. Vibratory intact via tuning fork bilateral. Protective threshold with Semmes Wienstein monofilament intact to all pedal sites bilateral. Patellar and Achilles deep tendon reflexes 2+ bilateral. No Babinski or clonus noted bilateral.   Musculoskeletal: No gross boney pedal deformities bilateral. No pain, crepitus, or limitation noted with foot and ankle range of motion bilateral. Muscular strength 5/5 in all groups tested bilateral.  Gait: Unassisted, Nonantalgic.    Radiographs:    Assessment & Plan:   Assessment:   Plan:      Cinzia Devos T. [place], [place]

## 2019-12-09 NOTE — Progress Notes (Signed)
He presents today states that think is doing better than it was.  But it still hurts in the afternoons when I been on it a lot.  Objective: Vital signs are stable he is alert and oriented x3.  Pulses are palpable.  He has pain on palpation anterior lateral ankle right.  Assessment: Ankle sprain with peroneal tendinitis right.  Plan: Physical therapy prescription today.

## 2019-12-16 ENCOUNTER — Other Ambulatory Visit: Payer: Self-pay | Admitting: Family Medicine

## 2019-12-16 DIAGNOSIS — M25571 Pain in right ankle and joints of right foot: Secondary | ICD-10-CM | POA: Diagnosis not present

## 2019-12-19 DIAGNOSIS — M25571 Pain in right ankle and joints of right foot: Secondary | ICD-10-CM | POA: Diagnosis not present

## 2019-12-23 DIAGNOSIS — N202 Calculus of kidney with calculus of ureter: Secondary | ICD-10-CM | POA: Diagnosis not present

## 2019-12-24 DIAGNOSIS — M25571 Pain in right ankle and joints of right foot: Secondary | ICD-10-CM | POA: Diagnosis not present

## 2019-12-26 DIAGNOSIS — M25571 Pain in right ankle and joints of right foot: Secondary | ICD-10-CM | POA: Diagnosis not present

## 2019-12-31 DIAGNOSIS — M25571 Pain in right ankle and joints of right foot: Secondary | ICD-10-CM | POA: Diagnosis not present

## 2020-01-02 DIAGNOSIS — M25571 Pain in right ankle and joints of right foot: Secondary | ICD-10-CM | POA: Diagnosis not present

## 2020-01-07 DIAGNOSIS — M25571 Pain in right ankle and joints of right foot: Secondary | ICD-10-CM | POA: Diagnosis not present

## 2020-01-08 ENCOUNTER — Encounter: Payer: Self-pay | Admitting: Cardiology

## 2020-01-08 ENCOUNTER — Ambulatory Visit: Payer: PPO | Admitting: Cardiology

## 2020-01-08 ENCOUNTER — Other Ambulatory Visit: Payer: Self-pay

## 2020-01-08 VITALS — BP 130/68 | HR 56 | Ht 71.0 in | Wt 210.6 lb

## 2020-01-08 DIAGNOSIS — I998 Other disorder of circulatory system: Secondary | ICD-10-CM | POA: Diagnosis not present

## 2020-01-08 DIAGNOSIS — N529 Male erectile dysfunction, unspecified: Secondary | ICD-10-CM | POA: Diagnosis not present

## 2020-01-08 DIAGNOSIS — Z131 Encounter for screening for diabetes mellitus: Secondary | ICD-10-CM

## 2020-01-08 DIAGNOSIS — E7800 Pure hypercholesterolemia, unspecified: Secondary | ICD-10-CM | POA: Diagnosis not present

## 2020-01-08 DIAGNOSIS — Z7189 Other specified counseling: Secondary | ICD-10-CM

## 2020-01-08 DIAGNOSIS — R072 Precordial pain: Secondary | ICD-10-CM

## 2020-01-08 DIAGNOSIS — Z79899 Other long term (current) drug therapy: Secondary | ICD-10-CM

## 2020-01-08 DIAGNOSIS — Z8249 Family history of ischemic heart disease and other diseases of the circulatory system: Secondary | ICD-10-CM

## 2020-01-08 DIAGNOSIS — Z01812 Encounter for preprocedural laboratory examination: Secondary | ICD-10-CM

## 2020-01-08 MED ORDER — ROSUVASTATIN CALCIUM 20 MG PO TABS
20.0000 mg | ORAL_TABLET | Freq: Every day | ORAL | 3 refills | Status: DC
Start: 2020-01-08 — End: 2020-12-31

## 2020-01-08 MED ORDER — ASPIRIN EC 81 MG PO TBEC
81.0000 mg | DELAYED_RELEASE_TABLET | Freq: Every day | ORAL | 3 refills | Status: AC
Start: 2020-01-08 — End: ?

## 2020-01-08 NOTE — Patient Instructions (Addendum)
Medication Instructions:  Start Aspirin 81 mg daily Start Rosuvastatin 20 mg daily   *If you need a refill on your cardiac medications before your next appointment, please call your pharmacy*   Lab Work: Your physician recommends that you return for lab work today ( BMP, Lipid, A1C), then return in 2 months for ( Fasting lipid, LFT)  If you have labs (blood work) drawn today and your tests are completely normal, you will receive your results only by: Marland Kitchen MyChart Message (if you have MyChart) OR . A paper copy in the mail If you have any lab test that is abnormal or we need to change your treatment, we will call you to review the results.   Testing/Procedures: Non-Cardiac CT Angiography (CTA), is a special type of CT scan that uses a computer to produce multi-dimensional views of major blood vessels throughout the body. In CT angiography, a contrast material is injected through an IV to help visualize the blood vessels [facility]  Follow-Up: At [facility], you and your health needs are our priority.  As part of our continuing mission to provide you with exceptional heart care, we have created designated Provider Care Teams.  These Care Teams include your primary Cardiologist (physician) and Advanced Practice Providers (APPs -  Physician Assistants and Nurse Practitioners) who all work together to provide you with the care you need, when you need it.  We recommend signing up for the patient portal called "MyChart".  Sign up information is provided on this After Visit Summary.  MyChart is used to connect with patients for Virtual Visits (Telemedicine).  Patients are able to view lab/test results, encounter notes, upcoming appointments, etc.  Non-urgent messages can be sent to your provider as well.   To learn more about what you can do with MyChart, go to [email or web address].    Your next appointment:   6 month(s)  The format for your next appointment:   In  Person  Provider:   Buford Dresser, MD  Your cardiac CT will be scheduled at one of the below locations:   [facility] [address] [place], [place] [address] [phone]  If scheduled at [facility], please arrive at the [facility] main entrance of [facility] 30 minutes prior to test start time. Proceed to the [facility] Radiology Department (first floor) to check-in and test prep.  If scheduled at [facility], please arrive 15 mins early for check-in and test prep.  Please follow these instructions carefully (unless otherwise directed):  Hold all erectile dysfunction medications at least 3 days (72 hrs) prior to test.  On the Night Before the Test: . Be sure to Drink plenty of water. . Do not consume any caffeinated/decaffeinated beverages or chocolate 12 hours prior to your test. . Do not take any antihistamines 12 hours prior to your test.   On the Day of the Test: . Drink plenty of water. Do not drink any water within one hour of the test. . Do not eat any food 4 hours prior to the test. . You may take your regular medications prior to the test.        After the Test: . Drink plenty of water. . After receiving IV contrast, you may experience a mild flushed feeling. This is normal. . On occasion, you may experience a mild rash up to 24 hours after the test. This is not dangerous. If this occurs, you can take Benadryl 25 mg  and increase your fluid intake. . If you experience trouble breathing, this can be serious. If it is severe call 911 IMMEDIATELY. If it is mild, please call our office. . If you take any of these medications: Glipizide/Metformin, Avandament, Glucavance, please do not take 48 hours after completing test unless otherwise instructed.   Once we have confirmed authorization from your insurance company, we will call you to set up a date and time for your test. Based on how quickly your  insurance processes prior authorizations requests, please allow up to 4 weeks to be contacted for scheduling your Cardiac CT appointment. Be advised that routine Cardiac CT appointments could be scheduled as many as 8 weeks after your provider has ordered it.  For non-scheduling related questions, please contact the cardiac imaging nurse navigator should you have any questions/concerns: Marchia Bond, Cardiac Imaging Nurse Navigator Burley Saver, Interim Cardiac Imaging Nurse [facility] Direct Office Dial: [phone]   For scheduling needs, including cancellations and rescheduling, please call Vivien Rota at [phone], option [phone].    Mediterranean Diet A Mediterranean diet refers to food and lifestyle choices that are based on the traditions of countries located on the [employer]. This way of eating has been shown to help prevent certain conditions and improve outcomes for people who have chronic diseases, like kidney disease and heart disease. What are tips for following this plan? Lifestyle  Cook and eat meals together with your family, when possible.  Drink enough fluid to keep your urine clear or pale yellow.  Be physically active every day. This includes: ? Aerobic exercise like running or swimming. ? Leisure activities like gardening, walking, or housework.  Get 7-8 hours of sleep each night.  If recommended by your health care provider, drink red wine in moderation. This means 1 glass a day for nonpregnant women and 2 glasses a day for men. A glass of wine equals 5 oz (150 mL). Reading food labels   Check the serving size of packaged foods. For foods such as rice and pasta, the serving size refers to the amount of cooked product, not dry.  Check the total fat in packaged foods. Avoid foods that have saturated fat or trans fats.  Check the ingredients list for added sugars, such as corn syrup. Shopping  At the grocery store, buy most of  your food from the areas near the walls of the store. This includes: ? Fresh fruits and vegetables (produce). ? Grains, beans, nuts, and seeds. Some of these may be available in unpackaged forms or large amounts (in bulk). ? Fresh seafood. ? Poultry and eggs. ? Low-fat dairy products.  Buy whole ingredients instead of prepackaged foods.  Buy fresh fruits and vegetables in-season from local farmers markets.  Buy frozen fruits and vegetables in resealable bags.  If you do not have access to quality fresh seafood, buy precooked frozen shrimp or canned fish, such as tuna, salmon, or sardines.  Buy small amounts of raw or cooked vegetables, salads, or olives from the deli or salad bar at your store.  Stock your pantry so you always have certain foods on hand, such as olive oil, canned tuna, canned tomatoes, rice, pasta, and beans. Cooking  Cook foods with extra-virgin olive oil instead of using butter or other vegetable oils.  Have meat as a side dish, and have vegetables or grains as your main dish. This means having meat in small portions or adding small amounts of meat to foods like  pasta or stew.  Use beans or vegetables instead of meat in common dishes like chili or lasagna.  Experiment with different cooking methods. Try roasting or broiling vegetables instead of steaming or sauteing them.  Add frozen vegetables to soups, stews, pasta, or rice.  Add nuts or seeds for added healthy fat at each meal. You can add these to yogurt, salads, or vegetable dishes.  Marinate fish or vegetables using olive oil, lemon juice, garlic, and fresh herbs. Meal planning   Plan to eat 1 vegetarian meal one day each week. Try to work up to 2 vegetarian meals, if possible.  Eat seafood 2 or more times a week.  Have healthy snacks readily available, such as: ? Vegetable sticks with hummus. ? [place] yogurt. ? Fruit and nut trail mix.  Eat balanced meals throughout the week. This  includes: ? Fruit: 2-3 servings a day ? Vegetables: 4-5 servings a day ? Low-fat dairy: 2 servings a day ? Fish, poultry, or lean meat: 1 serving a day ? Beans and legumes: 2 or more servings a week ? Nuts and seeds: 1-2 servings a day ? Whole grains: 6-8 servings a day ? Extra-virgin olive oil: 3-4 servings a day  Limit red meat and sweets to only a few servings a month What are my food choices?  Mediterranean diet ? Recommended  Grains: Whole-grain pasta. Brown rice. Bulgar wheat. Polenta. Couscous. Whole-wheat bread. [place].  Vegetables: Artichokes. Beets. Broccoli. Cabbage. Carrots. Eggplant. Green beans. Chard. Kale. Spinach. Onions. Leeks. Peas. Squash. Tomatoes. Peppers. Radishes.  Fruits: Apples. Apricots. Avocado. Berries. Bananas. Cherries. Dates. Figs. Grapes. Lemons. Melon. Oranges. Peaches. Plums. Pomegranate.  Meats and other protein foods: Beans. Almonds. Sunflower seeds. Pine nuts. Peanuts. [place]. Salmon. Scallops. Shrimp. [place]. Tilapia. Clams. Oysters. Eggs.  Dairy: Low-fat milk. Cheese. Greek yogurt.  Beverages: Water. Red wine. Herbal tea.  Fats and oils: Extra virgin olive oil. Avocado oil. Grape seed oil.  Sweets and desserts: [place] yogurt with honey. Baked apples. Poached pears. Trail mix.  Seasoning and other foods: Basil. Cilantro. Coriander. Cumin. Mint. Parsley. Sage. Rosemary. Tarragon. Garlic. Oregano. Thyme. Pepper. Balsalmic vinegar. Tahini. Hummus. Tomato sauce. Olives. Mushrooms. ? Limit these  Grains: Prepackaged pasta or rice dishes. Prepackaged cereal with added sugar.  Vegetables: Deep fried potatoes (french fries).  Fruits: Fruit canned in syrup.  Meats and other protein foods: Beef. Pork. Lamb. Poultry with skin. Hot dogs. Berniece Salines.  Dairy: Ice cream. Sour cream. Whole milk.  Beverages: Juice. Sugar-sweetened soft drinks. Beer. Liquor and spirits.  Fats and oils: Butter. Canola oil. Vegetable oil. Beef fat (tallow).  Lard.  Sweets and desserts: Cookies. Cakes. Pies. Candy.  Seasoning and other foods: Mayonnaise. Premade sauces and marinades. The items listed may not be a complete list. Talk with your dietitian about what dietary choices are right for you. Summary  The Mediterranean diet includes both food and lifestyle choices.  Eat a variety of fresh fruits and vegetables, beans, nuts, seeds, and whole grains.  Limit the amount of red meat and sweets that you eat.  Talk with your health care provider about whether it is safe for you to drink red wine in moderation. This means 1 glass a day for nonpregnant women and 2 glasses a day for men. A glass of wine equals 5 oz (150 mL). This information is not intended to replace advice given to you by your health care provider. Make sure you discuss any questions you have with your health care provider. Document Revised: 12/24/2015  Document Reviewed: 12/17/2015 Elsevier Patient Education  [employer].

## 2020-01-08 NOTE — Progress Notes (Signed)
Cardiology Office Note:    Date:  01/08/2020   ID:  Trevor Ross, DOB 1946-01-12, MRN [id number]  PCP:  Tonia Ghent, MD  Cardiologist:  Buford Dresser, MD  Referring MD: Tonia Ghent, MD   CC: new consultation for chest pain and possible CAD  History of Present Illness:    Trevor Ross is a 74 y.o. male with a hx of arthritis, vascular calcification who is seen as a new consult at the request of Tonia Ghent, MD for the evaluation and management of possible coronary artery disease and chest pain.  Note from Dr. Phillip Heal from 11/18/19 reviewed. Noted to have calcifications on prior foot x-ray. Having left sided shoulder and upper left chest pain, reproducible on exam. Referred for further evaluation.  Cardiovascular risk factors: Prior clinical ASCVD: no MI or CVA, does have vascular calcification on x-ray Comorbid conditions: Denies hypertension, hyperlipidemia, diabetes, chronic kidney disease  Metabolic syndrome/Obesity: BMI 29 Chronic inflammatory conditions: none Tobacco use history: none since teenage years Family history: mother had CHF, passed away age 68. Father died suddenly, presumed heart attack, at age 64. Mat Gma had cancer, may have had heart issues as well. Mat Gpa died in his 30s, unknown cause. [name] died in his 26s, unknown cause. [name] also died of unknown causes.  Prior cardiac testing and/or incidental findings on other testing (ie coronary calcium): calcium on foot x-ray. No cardiac testing Exercise level: able to work outside, [employer] the yard. Has trouble walking due to knee replacement, does have some cramping with stretching but not with exertion. Current diet: fried foods, ice cream though wife cooks healthy food at home. Drinks a lot of Pepsi.  Shoulder pain has resolved. Has mild chest tightness when working outside in the heat. Has some lightheadedness/sweating on occasion, especially after exerting himself outside.  Denies shortness  of breath at rest or with normal exertion. No PND, orthopnea, LE edema or unexpected weight gain. No syncope or palpitations.  Past Medical History:  Diagnosis Date   Allergy    Arthritis    hands   Diverticulosis of colon 09/15/2006   Dysphagia    Hiatal hernia    Nasal polyps    Nephrolithiasis    PSA per Dr. Serita Butcher   OSA (obstructive sleep apnea)     Past Surgical History:  Procedure Laterality Date   APPENDECTOMY  1988   CARPAL TUNNEL RELEASE Left    CATARACT EXTRACTION Bilateral    Left knee repair  1970's   LITHOTRIPSY     twice per pt   right knee surgery     arthroscopic   Sinus surgery for polyps      Current Medications: Current Outpatient Medications on File Prior to Visit  Medication Sig   Glucosamine-Chondroit-Vit C-Mn (GLUCOSAMINE 1500 COMPLEX) CAPS Take 1 capsule by mouth daily.    HYDROcodone-acetaminophen (NORCO/VICODIN) 5-325 MG tablet Take 1 tablet by mouth every 6 (six) hours as needed.   methocarbamol (ROBAXIN) 500 MG tablet Take 1 tablet (500 mg total) by mouth 2 (two) times daily.   multivitamin-lutein (OCUVITE-LUTEIN) CAPS capsule Take 1 capsule by mouth daily.   naproxen sodium (ALEVE) 220 MG tablet Take 440 mg by mouth as needed (pain).   omeprazole (PRILOSEC) 20 MG capsule TAKE 1 CAPSULE BY MOUTH ONCE DAILY   Phenylephrine HCl (SINEX REGULAR NA) Place into the nose. Use daily as needed   sildenafil (VIAGRA) 100 MG tablet Take 100 mg by mouth daily as needed  for erectile dysfunction.   tamsulosin (FLOMAX) 0.4 MG CAPS capsule Take 0.4 mg by mouth daily.   No current facility-administered medications on file prior to visit.     Allergies:   Patient has no known allergies.   Social History   Tobacco Use   Smoking status: Never Smoker   Smokeless tobacco: Never Used  Vaping Use   Vaping Use: Never used  Substance Use Topics   Alcohol use: Yes    Alcohol/week: 0.0 standard drinks    Comment: rare   Drug  use: No    Family History: family history includes Heart disease in his mother; Prostate cancer in his father. There is no history of Colon cancer.  ROS:   Please see the history of present illness.  Additional pertinent ROS: Constitutional: Negative for chills, fever, night sweats, unintentional weight loss  HENT: Negative for ear pain and hearing loss.   Eyes: Negative for loss of vision and eye pain.  Respiratory: Negative for cough, sputum, wheezing.   Cardiovascular: See HPI. Gastrointestinal: Negative for abdominal pain, melena, and hematochezia.  Genitourinary: Negative for dysuria and hematuria.  Musculoskeletal: Negative for falls and myalgias.  Skin: Negative for itching and rash.  Neurological: Negative for focal weakness, focal sensory changes and loss of consciousness.  Endo/Heme/Allergies: Does not bruise/bleed easily.     EKGs/Labs/Other Studies Reviewed:    The following studies were reviewed today: No prior cardiac studies  EKG:  EKG is personally reviewed.  The ekg ordered today demonstrates sinus bradycardia, 1st degree AV block, HR 56 bpm  Recent Labs: 02/15/2019: ALT 10 11/14/2019: BUN 15; Creatinine, Ser 1.10; Hemoglobin 13.9; Platelets 275; Potassium 3.8; Sodium 138  Recent Lipid Panel    Component Value Date/Time   CHOL 180 02/15/2019 0853   TRIG 88.0 02/15/2019 0853   HDL 45.40 02/15/2019 0853   CHOLHDL 4 02/15/2019 0853   VLDL 17.6 02/15/2019 0853   LDLCALC 117 (H) 02/15/2019 0853    Physical Exam:    VS:  BP 130/68    Pulse (!) 56    Ht 5' 11"  (1.803 m)    Wt 210 lb 9.6 oz (95.5 kg)    BMI 29.37 kg/m     Wt Readings from Last 3 Encounters:  01/08/20 210 lb 9.6 oz (95.5 kg)  11/18/19 206 lb 12.8 oz (93.8 kg)  11/14/19 201 lb (91.2 kg)    GEN: Well nourished, well developed in no acute distress HEENT: Normal, moist mucous membranes NECK: No JVD CARDIAC: regular rhythm, normal S1 and S2, no rubs or gallops. No murmurs. VASCULAR: Radial and  DP pulses 2+ bilaterally. No carotid bruits RESPIRATORY:  Clear to auscultation without rales, wheezing or rhonchi  ABDOMEN: Soft, non-tender, non-distended MUSCULOSKELETAL:  Ambulates independently SKIN: Warm and dry, no edema NEUROLOGIC:  Alert and oriented x 3. No focal neuro deficits noted. PSYCHIATRIC:  Normal affect    ASSESSMENT:    1. Precordial pain   2. FH: CAD (coronary artery disease)   3. Vascular calcification   4. Erectile dysfunction, unspecified erectile dysfunction type   5. Encounter for screening for diabetes mellitus   6. Pure hypercholesterolemia   7. Medication management   8. Pre-procedure lab exam   9. Cardiac risk counseling   10. Counseling on health promotion and disease prevention    PLAN:    Precordial pain: --discussed treadmill stress, nuclear stress/lexiscan, and CT coronary angiography. Discussed pros and cons of each, including but not limited to false positive/false negative risk,  radiation risk, and risk of IV contrast dye. Based on shared decision making, decision was made to pursue CT coronary angiography. -resting heart rate is 56 bpm, can get IV metoprolol if needed -counseled on need to get BMET prior to test -counseled on use of sublingual nitroglycerin and its importance to a good test -discussed need to not take PDE5i for at least 48 hours prior to test  Hypercholesterolemia -recheck lipids -start rosuvastatin 20 mg daily given ASCVD risk, vascular calcification -recheck lipids and LFTs in 2 mos after starting statin  Vascular calcification: concerning for early PAD, though pulses strong and no claudication symptoms -statin as above -starting aspirin 81 mg  Diabetes screening: given CV risk factors, will screen with A1c  Cardiac risk counseling and prevention recommendations: -recommend heart healthy/Mediterranean diet, with whole grains, fruits, vegetable, fish, lean meats, nuts, and olive oil. Limit salt. -recommend moderate  walking, 3-5 times/week for 30-50 minutes each session. Aim for at least 150 minutes.week. Goal should be pace of 3 miles/hours, or walking 1.5 miles in 30 minutes -recommend avoidance of tobacco products. Avoid excess alcohol. -Additional risk factor control:  -Blood pressure control: at goal today  -Weight: BMI 29 -ASCVD risk score: The 10-year ASCVD risk score Mikey Bussing DC Brooke Bonito., et al., 2013) is: 21%   Values used to calculate the score:     Age: 79 years     Sex: Male     Is Non-Hispanic African American: No     Diabetic: No     Tobacco smoker: No     Systolic Blood Pressure: 280 mmHg     Is BP treated: No     HDL Cholesterol: 45.4 mg/dL     Total Cholesterol: 180 mg/dL    Plan for follow up: 6 mos or sooner based on results of testing  Buford Dresser, MD, PhD [place]   [facility]    Medication Adjustments/Labs and Tests Ordered: Current medicines are reviewed at length with the patient today.  Concerns regarding medicines are outlined above.  Orders Placed This Encounter  Procedures   CT CORONARY MORPH W/CTA COR W/SCORE W/CA W/CM &/OR WO/CM   CT CORONARY FRACTIONAL FLOW RESERVE DATA PREP   CT CORONARY FRACTIONAL FLOW RESERVE FLUID ANALYSIS   Basic metabolic panel   Hemoglobin A1c   Lipid panel   Lipid panel   Hepatic function panel   Meds ordered this encounter  Medications   aspirin EC 81 MG tablet    Sig: Take 1 tablet (81 mg total) by mouth daily. Swallow whole.    Dispense:  90 tablet    Refill:  3   rosuvastatin (CRESTOR) 20 MG tablet    Sig: Take 1 tablet (20 mg total) by mouth daily.    Dispense:  90 tablet    Refill:  3    Patient Instructions  Medication Instructions:  Start Aspirin 81 mg daily Start Rosuvastatin 20 mg daily   *If you need a refill on your cardiac medications before your next appointment, please call your pharmacy*   Lab Work: Your physician recommends that you return for lab work today ( BMP, Lipid, A1C),  then return in 2 months for ( Fasting lipid, LFT)  If you have labs (blood work) drawn today and your tests are completely normal, you will receive your results only by:  [place] (if you have MyChart) OR  A paper copy in the mail If you have any lab test that is abnormal or we need to  change your treatment, we will call you to review the results.   Testing/Procedures: Non-Cardiac CT Angiography (CTA), is a special type of CT scan that uses a computer to produce multi-dimensional views of major blood vessels throughout the body. In CT angiography, a contrast material is injected through an IV to help visualize the blood vessels [facility]  Follow-Up: At [facility], you and your health needs are our priority.  As part of our continuing mission to provide you with exceptional heart care, we have created designated Provider Care Teams.  These Care Teams include your primary Cardiologist (physician) and Advanced Practice Providers (APPs -  Physician Assistants and Nurse Practitioners) who all work together to provide you with the care you need, when you need it.  We recommend signing up for the patient portal called "MyChart".  Sign up information is provided on this After Visit Summary.  MyChart is used to connect with patients for Virtual Visits (Telemedicine).  Patients are able to view lab/test results, encounter notes, upcoming appointments, etc.  Non-urgent messages can be sent to your provider as well.   To learn more about what you can do with MyChart, go to [email or web address].    Your next appointment:   6 month(s)  The format for your next appointment:   In Person  Provider:   Buford Dresser, MD  Your cardiac CT will be scheduled at one of the below locations:   [facility] [address] [place], [place] [address] [phone]  If scheduled at [facility], please arrive at the [facility] main entrance of [facility] 30 minutes prior to test start time. Proceed to the [facility] Radiology Department (first floor) to check-in and test prep.  If scheduled at [facility], please arrive 15 mins early for check-in and test prep.  Please follow these instructions carefully (unless otherwise directed):  Hold all erectile dysfunction medications at least 3 days (72 hrs) prior to test.  On the Night Before the Test:  Be sure to Drink plenty of water.  Do not consume any caffeinated/decaffeinated beverages or chocolate 12 hours prior to your test.  Do not take any antihistamines 12 hours prior to your test.   On the Day of the Test:  Drink plenty of water. Do not drink any water within one hour of the test.  Do not eat any food 4 hours prior to the test.  You may take your regular medications prior to the test.        After the Test:  Drink plenty of water.  After receiving IV contrast, you may experience a mild flushed feeling. This is normal.  On occasion, you may experience a mild rash up to 24 hours after the test. This is not dangerous. If this occurs, you can take Benadryl 25 mg and increase your fluid intake.  If you experience trouble breathing, this can be serious. If it is severe call 911 IMMEDIATELY. If it is mild, please call our office.  If you take any of these medications: Glipizide/Metformin, Avandament, Glucavance, please do not take 48 hours after completing test unless otherwise instructed.   Once we have confirmed authorization from your insurance company, we will call you to set up a date and time for your test. Based on how quickly your insurance processes prior authorizations requests, please allow up to 4 weeks to be contacted for scheduling your Cardiac CT appointment. Be advised that routine Cardiac CT appointments  could be scheduled as many as 8 weeks after your provider has ordered it.  For non-scheduling related questions, please  contact the cardiac imaging nurse navigator should you have any questions/concerns: Marchia Bond, Cardiac Imaging Nurse Navigator Burley Saver, Interim Cardiac Imaging Nurse [place] [facility] Direct Office Dial: [phone]   For scheduling needs, including cancellations and rescheduling, please call Vivien Rota at [phone], option 3.    Mediterranean Diet A Mediterranean diet refers to food and lifestyle choices that are based on the traditions of countries located on the [employer]. This way of eating has been shown to help prevent certain conditions and improve outcomes for people who have chronic diseases, like kidney disease and heart disease. What are tips for following this plan? Lifestyle  Cook and eat meals together with your family, when possible.  Drink enough fluid to keep your urine clear or pale yellow.  Be physically active every day. This includes: ? Aerobic exercise like running or swimming. ? Leisure activities like gardening, walking, or housework.  Get 7-8 hours of sleep each night.  If recommended by your health care provider, drink red wine in moderation. This means 1 glass a day for nonpregnant women and 2 glasses a day for men. A glass of wine equals 5 oz (150 mL). Reading food labels   Check the serving size of packaged foods. For foods such as rice and pasta, the serving size refers to the amount of cooked product, not dry.  Check the total fat in packaged foods. Avoid foods that have saturated fat or trans fats.  Check the ingredients list for added sugars, such as corn syrup. Shopping  At the grocery store, buy most of your food from the areas near the walls of the store. This includes: ? Fresh fruits and vegetables (produce). ? Grains, beans, nuts, and seeds. Some of these may be available in unpackaged forms or large amounts (in bulk). ? Fresh seafood. ? Poultry and eggs. ? Low-fat dairy products.  Buy whole  ingredients instead of prepackaged foods.  Buy fresh fruits and vegetables in-season from local farmers markets.  Buy frozen fruits and vegetables in resealable bags.  If you do not have access to quality fresh seafood, buy precooked frozen shrimp or canned fish, such as tuna, salmon, or sardines.  Buy small amounts of raw or cooked vegetables, salads, or olives from the deli or salad bar at your store.  Stock your pantry so you always have certain foods on hand, such as olive oil, canned tuna, canned tomatoes, rice, pasta, and beans. Cooking  Cook foods with extra-virgin olive oil instead of using butter or other vegetable oils.  Have meat as a side dish, and have vegetables or grains as your main dish. This means having meat in small portions or adding small amounts of meat to foods like pasta or stew.  Use beans or vegetables instead of meat in common dishes like chili or lasagna.  Experiment with different cooking methods. Try roasting or broiling vegetables instead of steaming or sauteing them.  Add frozen vegetables to soups, stews, pasta, or rice.  Add nuts or seeds for added healthy fat at each meal. You can add these to yogurt, salads, or vegetable dishes.  Marinate fish or vegetables using olive oil, lemon juice, garlic, and fresh herbs. Meal planning   Plan to eat 1 vegetarian meal one day each week. Try to work up to 2 vegetarian meals, if possible.  Eat seafood 2 or  more times a week.  Have healthy snacks readily available, such as: ? Vegetable sticks with hummus. ? [place] yogurt. ? Fruit and nut trail mix.  Eat balanced meals throughout the week. This includes: ? Fruit: 2-3 servings a day ? Vegetables: 4-5 servings a day ? Low-fat dairy: 2 servings a day ? Fish, poultry, or lean meat: 1 serving a day ? Beans and legumes: 2 or more servings a week ? Nuts and seeds: 1-2 servings a day ? Whole grains: 6-8 servings a day ? Extra-virgin olive oil: 3-4 servings a  day  Limit red meat and sweets to only a few servings a month What are my food choices?  Mediterranean diet ? Recommended  Grains: Whole-grain pasta. Brown rice. Bulgar wheat. Polenta. Couscous. Whole-wheat bread. [place].  Vegetables: Artichokes. Beets. Broccoli. Cabbage. Carrots. Eggplant. Green beans. Chard. Kale. Spinach. Onions. Leeks. Peas. Squash. Tomatoes. Peppers. Radishes.  Fruits: Apples. Apricots. Avocado. Berries. Bananas. Cherries. Dates. Figs. Grapes. Lemons. Melon. Oranges. Peaches. Plums. Pomegranate.  Meats and other protein foods: Beans. Almonds. Sunflower seeds. Pine nuts. Peanuts. [place]. Salmon. Scallops. Shrimp. [place]. Tilapia. Clams. Oysters. Eggs.  Dairy: Low-fat milk. Cheese. Greek yogurt.  Beverages: Water. Red wine. Herbal tea.  Fats and oils: Extra virgin olive oil. Avocado oil. Grape seed oil.  Sweets and desserts: [place] yogurt with honey. Baked apples. Poached pears. Trail mix.  Seasoning and other foods: Basil. Cilantro. Coriander. Cumin. Mint. Parsley. Sage. Rosemary. Tarragon. Garlic. Oregano. Thyme. Pepper. Balsalmic vinegar. Tahini. Hummus. Tomato sauce. Olives. Mushrooms. ? Limit these  Grains: Prepackaged pasta or rice dishes. Prepackaged cereal with added sugar.  Vegetables: Deep fried potatoes (french fries).  Fruits: Fruit canned in syrup.  Meats and other protein foods: Beef. Pork. Lamb. Poultry with skin. Hot dogs. Berniece Salines.  Dairy: Ice cream. Sour cream. Whole milk.  Beverages: Juice. Sugar-sweetened soft drinks. Beer. Liquor and spirits.  Fats and oils: Butter. Canola oil. Vegetable oil. Beef fat (tallow). Lard.  Sweets and desserts: Cookies. Cakes. Pies. Candy.  Seasoning and other foods: Mayonnaise. Premade sauces and marinades. The items listed may not be a complete list. Talk with your dietitian about what dietary choices are right for you. Summary  The Mediterranean diet includes both food and lifestyle choices.  Eat  a variety of fresh fruits and vegetables, beans, nuts, seeds, and whole grains.  Limit the amount of red meat and sweets that you eat.  Talk with your health care provider about whether it is safe for you to drink red wine in moderation. This means 1 glass a day for nonpregnant women and 2 glasses a day for men. A glass of wine equals 5 oz (150 mL). This information is not intended to replace advice given to you by your health care provider. Make sure you discuss any questions you have with your health care provider. Document Revised: 12/24/2015 Document Reviewed: 12/17/2015 Elsevier Patient Education  2020 [employer].      Signed, Buford Dresser, MD PhD 01/08/2020 1:13 PM    [facility]

## 2020-01-09 DIAGNOSIS — M25571 Pain in right ankle and joints of right foot: Secondary | ICD-10-CM | POA: Diagnosis not present

## 2020-01-09 LAB — BASIC METABOLIC PANEL
BUN/Creatinine Ratio: 15 (ref 10–24)
BUN: 17 mg/dL (ref 8–27)
CO2: 24 mmol/L (ref 20–29)
Calcium: 9.9 mg/dL (ref 8.6–10.2)
Chloride: 101 mmol/L (ref 96–106)
Creatinine, Ser: 1.11 mg/dL (ref 0.76–1.27)
GFR calc Af Amer: 76 mL/min/{1.73_m2} (ref >59)
GFR calc non Af Amer: 66 mL/min/{1.73_m2} (ref >59)
Glucose: 83 mg/dL (ref 65–99)
Potassium: 4.9 mmol/L (ref 3.5–5.2)
Sodium: 139 mmol/L (ref 134–144)

## 2020-01-09 LAB — LIPID PANEL
Chol/HDL Ratio: 3.7 ratio (ref 0.0–5.0)
Cholesterol, Total: 201 mg/dL — ABNORMAL HIGH (ref 100–199)
HDL: 54 mg/dL (ref >39)
LDL Chol Calc (NIH): 128 mg/dL — ABNORMAL HIGH (ref 0–99)
Triglycerides: 106 mg/dL (ref 0–149)
VLDL Cholesterol Cal: 19 mg/dL (ref 5–40)

## 2020-01-09 LAB — HEMOGLOBIN A1C
Est. average glucose Bld gHb Est-mCnc: 114 mg/dL
Hgb A1c MFr Bld: 5.6 % (ref 4.8–5.6)

## 2020-01-15 ENCOUNTER — Telehealth (HOSPITAL_COMMUNITY): Payer: Self-pay | Admitting: Emergency Medicine

## 2020-01-15 ENCOUNTER — Encounter: Payer: Self-pay | Admitting: Podiatry

## 2020-01-15 ENCOUNTER — Other Ambulatory Visit: Payer: Self-pay

## 2020-01-15 ENCOUNTER — Ambulatory Visit: Payer: PPO | Admitting: Podiatry

## 2020-01-15 DIAGNOSIS — M7671 Peroneal tendinitis, right leg: Secondary | ICD-10-CM | POA: Diagnosis not present

## 2020-01-15 DIAGNOSIS — Z79899 Other long term (current) drug therapy: Secondary | ICD-10-CM | POA: Diagnosis not present

## 2020-01-15 DIAGNOSIS — Z20828 Contact with and (suspected) exposure to other viral communicable diseases: Secondary | ICD-10-CM | POA: Diagnosis not present

## 2020-01-15 NOTE — Progress Notes (Signed)
He presents today for follow-up of his physical therapy states that his foot is still painful.  States that after seeing [place] and finding the calcification within the the arteries.  States that he is having to go through a full work-up including CTs and angio.  States that his foot is still still tender and swollen as he points to the anterior lateral ankle right.  Pulses are strongly palpable.  Neurologic sensorium is intact deep tendon reflexes are intact he has pain on palpation of the lateral peroneals and the anterior talofibular ligament there is mild edema no pitting edema in nature.  Assessment: Ankle sprain cannot rule out a tear of the ATFL and peroneal tendons.  Plan: I would currently like for him to have an MRI since physical therapy and other conservative therapies have failed to alleviate his symptoms.

## 2020-01-15 NOTE — Telephone Encounter (Signed)
Reaching out to patient to offer assistance regarding upcoming cardiac imaging study; pt verbalizes understanding of appt date/time, parking situation and where to check in, pre-test NPO status and medications ordered, and verified current allergies; name and call back number provided for further questions should they arise Trevor Bond RN [place] [facility] [phone] office [phone] cell   Pt verbalzed understsanding to not use viagra before test.

## 2020-01-16 ENCOUNTER — Ambulatory Visit
Admission: RE | Admit: 2020-01-16 | Discharge: 2020-01-16 | Disposition: A | Discharge status: Home / Self Care | Payer: PPO | Source: Ambulatory Visit | Attending: Cardiology | Admitting: Cardiology

## 2020-01-16 DIAGNOSIS — R072 Precordial pain: Secondary | ICD-10-CM | POA: Insufficient documentation

## 2020-01-16 LAB — HEPATIC FUNCTION PANEL
ALT: 15 IU/L (ref 0–44)
AST: 19 IU/L (ref 0–40)
Albumin: 4.4 g/dL (ref 3.7–4.7)
Alkaline Phosphatase: 79 IU/L (ref 48–121)
Bilirubin Total: 0.7 mg/dL (ref 0.0–1.2)
Bilirubin, Direct: 0.19 mg/dL (ref 0.00–0.40)
Total Protein: 6.8 g/dL (ref 6.0–8.5)

## 2020-01-16 LAB — LIPID PANEL
Chol/HDL Ratio: 2.5 ratio (ref 0.0–5.0)
Cholesterol, Total: 137 mg/dL (ref 100–199)
HDL: 54 mg/dL (ref >39)
LDL Chol Calc (NIH): 66 mg/dL (ref 0–99)
Triglycerides: 90 mg/dL (ref 0–149)
VLDL Cholesterol Cal: 17 mg/dL (ref 5–40)

## 2020-01-16 MED ORDER — METOPROLOL TARTRATE 5 MG/5ML IV SOLN
10.0000 mg | Freq: Once | INTRAVENOUS | Status: AC
  Administered 2020-01-16: 10 mg via INTRAVENOUS

## 2020-01-16 MED ORDER — IOHEXOL 350 MG/ML SOLN
75.0000 mL | Freq: Once | INTRAVENOUS | Status: AC | PRN
  Administered 2020-01-16: 75 mL via INTRAVENOUS

## 2020-01-16 MED ORDER — NITROGLYCERIN 0.4 MG SL SUBL
0.8000 mg | SUBLINGUAL_TABLET | Freq: Once | SUBLINGUAL | Status: AC
  Administered 2020-01-16: 0.8 mg via SUBLINGUAL

## 2020-01-16 NOTE — Progress Notes (Signed)
Patient tolerated procedure well. Ambulate w/o difficulty. Sitting in chair drinking. No needs. All questions answered. ABC intact. Discharge from procedure area w/o issues. Encouraged to drink extra water today.

## 2020-01-16 NOTE — Addendum Note (Signed)
Addended by: Zebedee Iba on: 01/16/2020 01:38 PM   Modules accepted: Orders

## 2020-01-17 ENCOUNTER — Telehealth: Payer: Self-pay

## 2020-01-17 DIAGNOSIS — M7671 Peroneal tendinitis, right leg: Secondary | ICD-10-CM

## 2020-01-17 NOTE — Telephone Encounter (Signed)
Per Trinna Post with [facility], no precert is required for MRI.  Patient has been notified and will call scheduling to set up MRI appt to his convenience.

## 2020-01-22 DIAGNOSIS — R072 Precordial pain: Secondary | ICD-10-CM | POA: Diagnosis not present

## 2020-01-30 ENCOUNTER — Other Ambulatory Visit: Payer: Self-pay

## 2020-01-30 ENCOUNTER — Encounter: Payer: Self-pay | Admitting: Cardiology

## 2020-01-30 ENCOUNTER — Ambulatory Visit: Payer: PPO | Admitting: Cardiology

## 2020-01-30 VITALS — BP 143/91 | HR 59 | Ht 71.0 in | Wt 213.4 lb

## 2020-01-30 DIAGNOSIS — I2510 Atherosclerotic heart disease of native coronary artery without angina pectoris: Secondary | ICD-10-CM | POA: Diagnosis not present

## 2020-01-30 DIAGNOSIS — Z712 Person consulting for explanation of examination or test findings: Secondary | ICD-10-CM | POA: Diagnosis not present

## 2020-01-30 DIAGNOSIS — Z8249 Family history of ischemic heart disease and other diseases of the circulatory system: Secondary | ICD-10-CM | POA: Diagnosis not present

## 2020-01-30 DIAGNOSIS — E7800 Pure hypercholesterolemia, unspecified: Secondary | ICD-10-CM

## 2020-01-30 DIAGNOSIS — Z7189 Other specified counseling: Secondary | ICD-10-CM | POA: Diagnosis not present

## 2020-01-30 DIAGNOSIS — I998 Other disorder of circulatory system: Secondary | ICD-10-CM | POA: Diagnosis not present

## 2020-01-30 MED ORDER — NITROGLYCERIN 0.4 MG SL SUBL
0.4000 mg | SUBLINGUAL_TABLET | SUBLINGUAL | 3 refills | Status: DC | PRN
Start: 2020-01-30 — End: 2021-01-13

## 2020-01-30 MED ORDER — ISOSORBIDE MONONITRATE ER 30 MG PO TB24
30.0000 mg | ORAL_TABLET | Freq: Every day | ORAL | 3 refills | Status: DC
Start: 2020-01-30 — End: 2020-04-07

## 2020-01-30 NOTE — Progress Notes (Signed)
Cardiology Office Note:    Date:  01/30/2020   ID:  Julion, Gatt 1945/05/17, MRN [id number]  PCP:  Tonia Ghent, MD  Cardiologist:  Buford Dresser, MD  Referring MD: Tonia Ghent, MD   CC: follow up  History of Present Illness:    Trevor Ross is a 74 y.o. male with a hx of arthritis, vascular calcification who is seen for follow up today. I initially met him 01/08/20 as a new consult at the request of Tonia Ghent, MD for the evaluation and management of possible coronary artery disease and chest pain.  Today: Here to review results of CT coronary. Wife present as well today. We reviewed his images,   Discussed NSAIDs, PDE5i and interactions/risks at length today. Discussed antianginals, cannot take beta blocker due to baseline bradycardia.   There was an abnormal pattern on the lipids, with two recent results. We confirmed that the lipids from 01/15/20 were his wifes, not his. He did not have labs drawn that day and she did.  Has had pressure in his chest three days ago and then again this morning. Episode three days ago lasted several hours. Not severe, but pressure in the chest that doesn't feel right.   Anginal equivalent: chest tightness with working outside in the heat.   We discussed multiple options for next steps. See summary below.  Denies shortness of breath at rest or with normal exertion. No PND, orthopnea, LE edema or unexpected weight gain. No syncope or palpitations.  Past Medical History:  Diagnosis Date  . Allergy   . Arthritis    hands  . Diverticulosis of colon 09/15/2006  . Dysphagia   . Hiatal hernia   . Nasal polyps   . Nephrolithiasis    PSA per Dr. Serita Butcher  . OSA (obstructive sleep apnea)     Past Surgical History:  Procedure Laterality Date  . APPENDECTOMY  1988  . CARPAL TUNNEL RELEASE Left   . CATARACT EXTRACTION Bilateral   . Left knee repair  1970's  . LITHOTRIPSY     twice per pt  . right knee surgery      arthroscopic  . Sinus surgery for polyps      Current Medications: Current Outpatient Medications on File Prior to Visit  Medication Sig  . aspirin EC 81 MG tablet Take 1 tablet (81 mg total) by mouth daily. Swallow whole.  . multivitamin-lutein (OCUVITE-LUTEIN) CAPS capsule Take 1 capsule by mouth daily.  . naproxen sodium (ALEVE) 220 MG tablet Take 440 mg by mouth as needed (pain).  Marland Kitchen omeprazole (PRILOSEC) 20 MG capsule TAKE 1 CAPSULE BY MOUTH ONCE DAILY  . Phenylephrine HCl (SINEX REGULAR NA) Place into the nose. Use daily as needed  . rosuvastatin (CRESTOR) 20 MG tablet Take 1 tablet (20 mg total) by mouth daily.  . sildenafil (VIAGRA) 100 MG tablet Take 100 mg by mouth daily as needed for erectile dysfunction.   No current facility-administered medications on file prior to visit.     Allergies:   Patient has no known allergies.   Social History   Tobacco Use  . Smoking status: Never Smoker  . Smokeless tobacco: Never Used  Vaping Use  . Vaping Use: Never used  Substance Use Topics  . Alcohol use: Yes    Alcohol/week: 0.0 standard drinks    Comment: rare  . Drug use: No    Family History: family history includes Heart disease in his mother; Prostate cancer in his  father. There is no history of Colon cancer. mother had CHF, passed away age 49. Father died suddenly, presumed heart attack, at age 64. Mat Gma had cancer, may have had heart issues as well. Mat Gpa died in his 45s, unknown cause. [name] died in his 60s, unknown cause. [name] also died of unknown causes.  ROS:   Please see the history of present illness.  Additional pertinent ROS otherwise unremarkable.  EKGs/Labs/Other Studies Reviewed:    The following studies were reviewed today: CT cardiac 01/16/20 RCA is a large dominant artery that gives rise to PDA and PLA. Calcified plaque in noted throughout the RCA causing moderate stenosis in the proximal and mid segments (50-69%) and mild stenosis in the distal  segment (25-49%)  Left main is a large artery that gives rise to LAD and LCX arteries. The is mild calcified plaque in the distal LM causing minimal stenosis (0-24%)  LAD is a large vessel that has calcified plaque in the proximal to mid segments causing moderate stenosis (50-69%).  LCX is a non-dominant artery that gives rise to one large OM1 branch. There is calcified plaque in the proximal first obtuse marginal branch causing mild stenosis (25-49%).  Other findings:  Normal pulmonary vein drainage into the left atrium.  Normal left atrial appendage without a thrombus.  Normal size of the pulmonary artery.  IMPRESSION: 1. High coronary calcium score of 2616. This was 94th percentile for age and sex matched control ([place]).  2. Normal coronary origin with right dominance.  3. Heavy calcified plaque causing moderate stenosis (50-69%) in the proximal to mid segments of the LAD and RCA  4. CAD-RADS 3. Moderate stenosis. Consider symptom-guided anti-ischemic pharmacotherapy as well as risk factor modification per guideline directed care.  Additional analysis with CT FFR will be submitted and reported separately.  5.  Asa, statin recommended if no contraindications.  FFR 01/16/20 1. Left Main:  No significant stenosis.  2. LAD: No significant focal stenosis. FFR 0.93 proximally, FFR 0.84 in the mid vessel, and slowly tapers to 0.75 distally with no focal stenosis 3. LCX: No significant stenosis. FFR 0.9 proximally, FFR 0.89 distally 4. RCA: No significant stenosis.  FFR 0.92  IMPRESSION: 1.  CT FFR analysis didn't show any significant focal stenosis.  EKG:  EKG is personally reviewed.  The ekg ordered 01/08/20 demonstrates sinus bradycardia, 1st degree AV block, HR 56 bpm  Recent Labs: 11/14/2019: Hemoglobin 13.9; Platelets 275 01/08/2020: BUN 17; Creatinine, Ser 1.11; Potassium 4.9; Sodium 139 01/15/2020: ALT 15  Recent Lipid Panel    Component Value  Date/Time   CHOL 137 01/15/2020 1003   TRIG 90 01/15/2020 1003   HDL 54 01/15/2020 1003   CHOLHDL 2.5 01/15/2020 1003   CHOLHDL 4 02/15/2019 0853   VLDL 17.6 02/15/2019 0853   LDLCALC 66 01/15/2020 1003    Physical Exam:    VS:  BP (!) 143/91   Pulse (!) 59   Ht 5' 11"  (1.803 m)   Wt 213 lb 6.4 oz (96.8 kg)   SpO2 96%   BMI 29.76 kg/m     Wt Readings from Last 3 Encounters:  01/30/20 213 lb 6.4 oz (96.8 kg)  01/08/20 210 lb 9.6 oz (95.5 kg)  11/18/19 206 lb 12.8 oz (93.8 kg)    GEN: Well nourished, well developed in no acute distress HEENT: Normal, moist mucous membranes NECK: No JVD CARDIAC: regular rhythm, normal S1 and S2, no rubs or gallops. No murmur. VASCULAR: Radial  and DP pulses 2+ bilaterally. No carotid bruits RESPIRATORY:  Clear to auscultation without rales, wheezing or rhonchi  ABDOMEN: Soft, non-tender, non-distended MUSCULOSKELETAL:  Ambulates independently SKIN: Warm and dry, no edema NEUROLOGIC:  Alert and oriented x 3. No focal neuro deficits noted. PSYCHIATRIC:  Normal affect   ASSESSMENT:    1. Coronary artery disease involving native coronary artery of native heart, unspecified whether angina present   2. Encounter to discuss test results   3. Vascular calcification   4. FH: CAD (coronary artery disease)   5. Pure hypercholesterolemia   6. Cardiac risk counseling   7. Counseling on health promotion and disease prevention    PLAN:    Test results: CAD on cardiac CT -we reviewed images at length -while there was no focal stenosis that was significant by FFR, there was gradual tapering -we discussed antianginals. Cannot take beta blocker due to baseline bradycardia -will start imdur 30 mg daily and PRN SL NG. Instructed on use -cannot use PDE5i including sildenafil. Understands the risks -discussed risk of NSAIDs with CAD. He will avoid -on aspirin, statin -counseled on red flag warning signs that need immediate medical attention -he will  monitor his symptoms and we will see if they improve on antianginal therapy  Hypercholesterolemia -recheck lipids after 2 mos of therapy -tolerating rosuvastatin 20 mg daily  -LDL goal <70  Vascular calcification: concerning for early PAD, though pulses strong and no claudication symptoms -statin as above -starting aspirin 81 mg  Diabetes screening: A1c 5.6  Cardiac risk counseling and prevention recommendations: -recommend heart healthy/Mediterranean diet, with whole grains, fruits, vegetable, fish, lean meats, nuts, and olive oil. Limit salt. -recommend moderate walking, 3-5 times/week for 30-50 minutes each session. Aim for at least 150 minutes.week. Goal should be pace of 3 miles/hours, or walking 1.5 miles in 30 minutes -recommend avoidance of tobacco products. Avoid excess alcohol.  Plan for follow up: 6-8 weeks  Buford Dresser, MD, PhD [place]  [facility]    Medication Adjustments/Labs and Tests Ordered: Current medicines are reviewed at length with the patient today.  Concerns regarding medicines are outlined above.  No orders of the defined types were placed in this encounter.  Meds ordered this encounter  Medications  . nitroGLYCERIN (NITROSTAT) 0.4 MG SL tablet    Sig: Place 1 tablet (0.4 mg total) under the tongue every 5 (five) minutes as needed for chest pain (max 3 doses).    Dispense:  25 tablet    Refill:  3  . isosorbide mononitrate (IMDUR) 30 MG 24 hr tablet    Sig: Take 1 tablet (30 mg total) by mouth daily.    Dispense:  90 tablet    Refill:  3    Patient Instructions  Medication Instructions:  Dr. Harrell Gave has prescribed nitroglycerin to use as needed for chest pain and isosorbide mononitrate 75m to take daily  Please STOP aleve & viagra   *If you need a refill on your cardiac medications before your next appointment, please call your pharmacy*  Follow-Up: At [facility] you and your health needs are our priority.  As part  of our continuing mission to provide you with exceptional heart care, we have created designated Provider Care Teams.  These Care Teams include your primary Cardiologist (physician) and Advanced Practice Providers (APPs -  Physician Assistants and Nurse Practitioners) who all work together to provide you with the care you need, when you need it.  We recommend signing up for the patient portal  called "MyChart".  Sign up information is provided on this After Visit Summary.  MyChart is used to connect with patients for Virtual Visits (Telemedicine).  Patients are able to view lab/test results, encounter notes, upcoming appointments, etc.  Non-urgent messages can be sent to your provider as well.   To learn more about what you can do with MyChart, go to [email or web address].    Your next appointment:   6-8 week(s)  The format for your next appointment:   In Person  Provider:   Buford Dresser, MD   Other Instructions     Signed, Buford Dresser, MD PhD 01/30/2020   [place]

## 2020-01-30 NOTE — Patient Instructions (Signed)
Medication Instructions:  Dr. Harrell Gave has prescribed nitroglycerin to use as needed for chest pain and isosorbide mononitrate 30mg  to take daily  Please STOP aleve & viagra   *If you need a refill on your cardiac medications before your next appointment, please call your pharmacy*  Follow-Up: At [facility], you and your health needs are our priority.  As part of our continuing mission to provide you with exceptional heart care, we have created designated Provider Care Teams.  These Care Teams include your primary Cardiologist (physician) and Advanced Practice Providers (APPs -  Physician Assistants and Nurse Practitioners) who all work together to provide you with the care you need, when you need it.  We recommend signing up for the patient portal called "MyChart".  Sign up information is provided on this After Visit Summary.  MyChart is used to connect with patients for Virtual Visits (Telemedicine).  Patients are able to view lab/test results, encounter notes, upcoming appointments, etc.  Non-urgent messages can be sent to your provider as well.   To learn more about what you can do with MyChart, go to [email or web address].    Your next appointment:   6-8 week(s)  The format for your next appointment:   In Person  Provider:   Buford Dresser, MD   Other Instructions

## 2020-02-04 ENCOUNTER — Ambulatory Visit
Admission: RE | Admit: 2020-02-04 | Discharge: 2020-02-04 | Disposition: A | Discharge status: Home / Self Care | Payer: PPO | Source: Ambulatory Visit | Attending: Podiatry | Admitting: Podiatry

## 2020-02-04 ENCOUNTER — Other Ambulatory Visit: Payer: Self-pay

## 2020-02-04 DIAGNOSIS — M65871 Other synovitis and tenosynovitis, right ankle and foot: Secondary | ICD-10-CM | POA: Diagnosis not present

## 2020-02-04 DIAGNOSIS — M7671 Peroneal tendinitis, right leg: Secondary | ICD-10-CM | POA: Insufficient documentation

## 2020-02-04 DIAGNOSIS — S99911A Unspecified injury of right ankle, initial encounter: Secondary | ICD-10-CM | POA: Diagnosis not present

## 2020-02-04 DIAGNOSIS — S86311A Strain of muscle(s) and tendon(s) of peroneal muscle group at lower leg level, right leg, initial encounter: Secondary | ICD-10-CM | POA: Diagnosis not present

## 2020-02-12 ENCOUNTER — Encounter: Payer: Self-pay | Admitting: Podiatry

## 2020-02-12 ENCOUNTER — Other Ambulatory Visit: Payer: Self-pay

## 2020-02-12 ENCOUNTER — Ambulatory Visit: Payer: PPO | Admitting: Podiatry

## 2020-02-12 DIAGNOSIS — M7671 Peroneal tendinitis, right leg: Secondary | ICD-10-CM | POA: Diagnosis not present

## 2020-02-12 NOTE — Progress Notes (Signed)
Trevor Ross presents today for follow-up of his peroneal tendons.  States that they are really not any better.  States that he has his CT and his angiogram stating that he has a 70% blockage of the coronary artery.  He states that they are going to try to treat it chemically at this point.  He states that his foot and ankle are not any better.  MRI does demonstrate severe tears of his peroneal tendons.  These will need to be repaired.  Objective: Vital signs are stable he is alert and oriented x3 pulses are palpable bilaterally peroneal tendons are inflamed along the lateral aspect of the right foot and are obviously subluxing.  Assessment: Peroneal tendinitis with tears and subluxing peroneal tendons right.  Plan: At this point we need to follow-up with his cardiology group to make sure that it is okay to do surgery however I highly doubt that she would want surgery done at this point secondary to the cardiac risk.  I will follow-up with him on an as needed basis at this point he will continue to utilize his brace and his compression anklet.

## 2020-02-26 ENCOUNTER — Telehealth: Payer: Self-pay | Admitting: Cardiology

## 2020-02-26 ENCOUNTER — Telehealth: Payer: Self-pay

## 2020-02-26 NOTE — Telephone Encounter (Signed)
Patients wife called wanting to follow up on Cardiology clearance?

## 2020-02-26 NOTE — Telephone Encounter (Signed)
    I did not need this encounter, I answered pt's wife question

## 2020-02-27 DIAGNOSIS — G4733 Obstructive sleep apnea (adult) (pediatric): Secondary | ICD-10-CM | POA: Diagnosis not present

## 2020-03-06 ENCOUNTER — Other Ambulatory Visit: Payer: Self-pay | Admitting: Cardiology

## 2020-03-06 DIAGNOSIS — Z79899 Other long term (current) drug therapy: Secondary | ICD-10-CM | POA: Diagnosis not present

## 2020-03-07 LAB — LIPID PANEL
Chol/HDL Ratio: 2.2 ratio (ref 0.0–5.0)
Cholesterol, Total: 112 mg/dL (ref 100–199)
HDL: 50 mg/dL (ref >39)
LDL Chol Calc (NIH): 47 mg/dL (ref 0–99)
Triglycerides: 74 mg/dL (ref 0–149)
VLDL Cholesterol Cal: 15 mg/dL (ref 5–40)

## 2020-03-07 LAB — HEPATIC FUNCTION PANEL
ALT: 12 IU/L (ref 0–44)
AST: 19 IU/L (ref 0–40)
Albumin: 4.5 g/dL (ref 3.7–4.7)
Alkaline Phosphatase: 71 IU/L (ref 44–121)
Bilirubin Total: 0.6 mg/dL (ref 0.0–1.2)
Bilirubin, Direct: 0.22 mg/dL (ref 0.00–0.40)
Total Protein: 6.6 g/dL (ref 6.0–8.5)

## 2020-03-13 ENCOUNTER — Ambulatory Visit: Payer: PPO | Admitting: Cardiology

## 2020-03-13 ENCOUNTER — Other Ambulatory Visit: Payer: Self-pay

## 2020-03-13 ENCOUNTER — Encounter: Payer: Self-pay | Admitting: Cardiology

## 2020-03-13 VITALS — BP 132/78 | HR 61 | Ht 71.0 in | Wt 207.0 lb

## 2020-03-13 DIAGNOSIS — R072 Precordial pain: Secondary | ICD-10-CM | POA: Diagnosis not present

## 2020-03-13 DIAGNOSIS — I998 Other disorder of circulatory system: Secondary | ICD-10-CM | POA: Diagnosis not present

## 2020-03-13 DIAGNOSIS — I2510 Atherosclerotic heart disease of native coronary artery without angina pectoris: Secondary | ICD-10-CM | POA: Diagnosis not present

## 2020-03-13 DIAGNOSIS — Z7189 Other specified counseling: Secondary | ICD-10-CM | POA: Diagnosis not present

## 2020-03-13 DIAGNOSIS — E7800 Pure hypercholesterolemia, unspecified: Secondary | ICD-10-CM | POA: Diagnosis not present

## 2020-03-13 NOTE — Progress Notes (Signed)
Cardiology Office Note:    Date:  03/13/2020   ID:  EVEREST BROD, DOB March 30, 1946, MRN [id number]  PCP:  Tonia Ghent, MD  Cardiologist:  Buford Dresser, MD  Referring MD: Tonia Ghent, MD   CC: follow up  History of Present Illness:    Trevor Ross is a 74 y.o. male with a hx of arthritis, vascular calcification who is seen for follow up today. I initially met him 01/08/20 as a new consult at the request of Tonia Ghent, MD for the evaluation and management of possible coronary artery disease and chest pain.  Today: Having discomfort at night sitting in chair, can also be with exertion. Worse with exertion, but not consistently every time he pushes himself. Has had 3-4 times this week, wife notes it is as rest. Largely pressure but also sharp pain. Not any better since starting aspirin, isosorbide, rosuvastatin. Can't tell a difference but doesn't feel better, notes is paying more attention to his symptoms.  Not being able to use Viagra has impacted both of them negatively. Has not required nitroglycerin.   He lifts weights in the afternoon. Also discussed after dinner/time pattern of his symptoms. Hasn't walked much this week but did last week.   We reviewed his lipids, excellent control based on labs last week.   Denies shortness of breath at rest or with normal exertion. No PND, orthopnea, LE edema or unexpected weight gain. No syncope or palpitations.  Will copy Dr. Milinda Pointer. Per patient, plans to hold off on surgery for a time, able to manage with a brace.  Past Medical History:  Diagnosis Date  . Allergy   . Arthritis    hands  . Diverticulosis of colon 09/15/2006  . Dysphagia   . Hiatal hernia   . Nasal polyps   . Nephrolithiasis    PSA per Dr. Serita Butcher  . OSA (obstructive sleep apnea)     Past Surgical History:  Procedure Laterality Date  . APPENDECTOMY  1988  . CARPAL TUNNEL RELEASE Left   . CATARACT EXTRACTION Bilateral   . Left knee  repair  1970's  . LITHOTRIPSY     twice per pt  . right knee surgery     arthroscopic  . Sinus surgery for polyps      Current Medications: Current Outpatient Medications on File Prior to Visit  Medication Sig  . aspirin EC 81 MG tablet Take 1 tablet (81 mg total) by mouth daily. Swallow whole.  . isosorbide mononitrate (IMDUR) 30 MG 24 hr tablet Take 1 tablet (30 mg total) by mouth daily.  . multivitamin-lutein (OCUVITE-LUTEIN) CAPS capsule Take 1 capsule by mouth daily.  . nitroGLYCERIN (NITROSTAT) 0.4 MG SL tablet Place 1 tablet (0.4 mg total) under the tongue every 5 (five) minutes as needed for chest pain (max 3 doses).  Marland Kitchen omeprazole (PRILOSEC) 20 MG capsule TAKE 1 CAPSULE BY MOUTH ONCE DAILY  . Phenylephrine HCl (SINEX REGULAR NA) Place into the nose. Use daily as needed  . rosuvastatin (CRESTOR) 20 MG tablet Take 1 tablet (20 mg total) by mouth daily.   No current facility-administered medications on file prior to visit.     Allergies:   Patient has no known allergies.   Social History   Tobacco Use  . Smoking status: Never Smoker  . Smokeless tobacco: Never Used  Vaping Use  . Vaping Use: Never used  Substance Use Topics  . Alcohol use: Yes    Alcohol/week: 0.0 standard drinks  Comment: rare  . Drug use: No    Family History: family history includes Heart disease in his mother; Prostate cancer in his father. There is no history of Colon cancer. mother had CHF, passed away age 46. Father died suddenly, presumed heart attack, at age 44. Mat Gma had cancer, may have had heart issues as well. Mat Gpa died in his 50s, unknown cause. [name] died in his 26s, unknown cause. [name] also died of unknown causes.  ROS:   Please see the history of present illness.  Additional pertinent ROS otherwise unremarkable.  EKGs/Labs/Other Studies Reviewed:    The following studies were reviewed today: CT cardiac 01/16/20 RCA is a large dominant artery that gives rise to PDA and  PLA. Calcified plaque in noted throughout the RCA causing moderate stenosis in the proximal and mid segments (50-69%) and mild stenosis in the distal segment (25-49%)  Left main is a large artery that gives rise to LAD and LCX arteries. The is mild calcified plaque in the distal LM causing minimal stenosis (0-24%)  LAD is a large vessel that has calcified plaque in the proximal to mid segments causing moderate stenosis (50-69%).  LCX is a non-dominant artery that gives rise to one large OM1 branch. There is calcified plaque in the proximal first obtuse marginal branch causing mild stenosis (25-49%).  Other findings:  Normal pulmonary vein drainage into the left atrium.  Normal left atrial appendage without a thrombus.  Normal size of the pulmonary artery.  IMPRESSION: 1. High coronary calcium score of 2616. This was 94th percentile for age and sex matched control ([place]).  2. Normal coronary origin with right dominance.  3. Heavy calcified plaque causing moderate stenosis (50-69%) in the proximal to mid segments of the LAD and RCA  4. CAD-RADS 3. Moderate stenosis. Consider symptom-guided anti-ischemic pharmacotherapy as well as risk factor modification per guideline directed care.  Additional analysis with CT FFR will be submitted and reported separately.  5.  Asa, statin recommended if no contraindications.  FFR 01/16/20 1. Left Main:  No significant stenosis.  2. LAD: No significant focal stenosis. FFR 0.93 proximally, FFR 0.84 in the mid vessel, and slowly tapers to 0.75 distally with no focal stenosis 3. LCX: No significant stenosis. FFR 0.9 proximally, FFR 0.89 distally 4. RCA: No significant stenosis.  FFR 0.92  IMPRESSION: 1.  CT FFR analysis didn't show any significant focal stenosis.  EKG:  EKG is personally reviewed.  The ekg ordered today demonstrates sinus bradycardia, 1st degree AV block, HR 56 bpm  Recent Labs: 11/14/2019:  Hemoglobin 13.9; Platelets 275 01/08/2020: BUN 17; Creatinine, Ser 1.11; Potassium 4.9; Sodium 139 03/06/2020: ALT 12  Recent Lipid Panel    Component Value Date/Time   CHOL 112 03/06/2020 0941   TRIG 74 03/06/2020 0941   HDL 50 03/06/2020 0941   CHOLHDL 2.2 03/06/2020 0941   CHOLHDL 4 02/15/2019 0853   VLDL 17.6 02/15/2019 0853   LDLCALC 47 03/06/2020 0941    Physical Exam:    VS:  BP 132/78   Pulse 61   Ht 5' 11"  (1.803 m)   Wt 207 lb (93.9 kg)   SpO2 97%   BMI 28.87 kg/m     Wt Readings from Last 3 Encounters:  03/13/20 207 lb (93.9 kg)  01/30/20 213 lb 6.4 oz (96.8 kg)  01/08/20 210 lb 9.6 oz (95.5 kg)    GEN: Well nourished, well developed in no acute distress HEENT: Normal, moist mucous membranes  NECK: No JVD CARDIAC: regular rhythm, normal S1 and S2, no rubs or gallops. No murmurs. VASCULAR: Radial and DP pulses 2+ bilaterally. No carotid bruits RESPIRATORY:  Clear to auscultation without rales, wheezing or rhonchi  ABDOMEN: Soft, non-tender, non-distended MUSCULOSKELETAL:  Ambulates independently SKIN: Warm and dry, no edema NEUROLOGIC:  Alert and oriented x 3. No focal neuro deficits noted. PSYCHIATRIC:  Normal affect    ASSESSMENT:    1. Precordial pain   2. Coronary artery disease involving native coronary artery of native heart, unspecified whether angina present   3. Pure hypercholesterolemia   4. Vascular calcification   5. Cardiac risk counseling   6. Counseling on health promotion and disease prevention    PLAN:    Precordial pain High coronary calcium score Moderate CAD on CT without FFR significant lesion -his discomfort is difficult to identify. He has both typical and atypical symptoms for coronary artery disease as cause -differential includes GERD, other -we will see if there is any improvement with reflux treatment -we will continue imdur for now. They understand that imdur and PDE5i cannot be used together -counseled on red flag  warning signs that need immediate medical attention -continue aspirin, rosuvastatin  Hypercholesterolemia: LDL goal <70 -continue rosuvastatin -recent LDL 47  Vascular calcification: concerning for early PAD, though pulses strong and no claudication symptoms -statin, aspirin as above  Cardiac risk counseling and prevention recommendations: -recommend heart healthy/Mediterranean diet, with whole grains, fruits, vegetable, fish, lean meats, nuts, and olive oil. Limit salt. -recommend moderate walking, 3-5 times/week for 30-50 minutes each session. Aim for at least 150 minutes.week. Goal should be pace of 3 miles/hours, or walking 1.5 miles in 30 minutes -recommend avoidance of tobacco products. Avoid excess alcohol. -Additional risk factor control:  -Blood pressure control: at goal today  -Weight: BMI 29 -ASCVD risk score: The ASCVD Risk score Mikey Bussing DC Jr., et al., 2013) failed to calculate for the following reasons:   The valid total cholesterol range is 130 to 320 mg/dL    Plan for follow up:04/01/20  Buford Dresser, MD, PhD [place]  [facility]    Medication Adjustments/Labs and Tests Ordered: Current medicines are reviewed at length with the patient today.  Concerns regarding medicines are outlined above.  No orders of the defined types were placed in this encounter.  No orders of the defined types were placed in this encounter.   Patient Instructions  Medication Instructions:  Try to take omeprazole 20 mg twice daily, 30 min before breakfast and then 30 min before your evening meal. You can also try to take a Tums or Rolaids to see if it helps. Keep a log of your symptoms and we will see if there is a pattern   Lab Work: None  Testing/Procedures: None   Follow-Up: At [facility], you and your health needs are our priority.  As part of our continuing mission to provide you with exceptional heart care, we have created designated Provider Care Teams.   These Care Teams include your primary Cardiologist (physician) and Advanced Practice Providers (APPs -  Physician Assistants and Nurse Practitioners) who all work together to provide you with the care you need, when you need it.  We recommend signing up for the patient portal called "MyChart".  Sign up information is provided on this After Visit Summary.  MyChart is used to connect with patients for Virtual Visits (Telemedicine).  Patients are able to view lab/test results, encounter notes, upcoming appointments, etc.  Non-urgent messages can  be sent to your provider as well.   To learn more about what you can do with MyChart, go to [email or web address].    Your next appointment:  04/01/20 at 9:20 am   The format for your next appointment:   Virtual Visit   Provider:   Buford Dresser, MD      Signed, Buford Dresser, MD PhD 03/13/2020  [place]

## 2020-03-13 NOTE — Patient Instructions (Addendum)
Medication Instructions:  Try to take omeprazole 20 mg twice daily, 30 min before breakfast and then 30 min before your evening meal. You can also try to take a Tums or Rolaids to see if it helps. Keep a log of your symptoms and we will see if there is a pattern   Lab Work: None  Testing/Procedures: None   Follow-Up: At [facility], you and your health needs are our priority.  As part of our continuing mission to provide you with exceptional heart care, we have created designated Provider Care Teams.  These Care Teams include your primary Cardiologist (physician) and Advanced Practice Providers (APPs -  Physician Assistants and Nurse Practitioners) who all work together to provide you with the care you need, when you need it.  We recommend signing up for the patient portal called "MyChart".  Sign up information is provided on this After Visit Summary.  MyChart is used to connect with patients for Virtual Visits (Telemedicine).  Patients are able to view lab/test results, encounter notes, upcoming appointments, etc.  Non-urgent messages can be sent to your provider as well.   To learn more about what you can do with MyChart, go to [email or web address].    Your next appointment:  04/01/20 at 9:20 am   The format for your next appointment:   Virtual Visit   Provider:   Buford Dresser, MD

## 2020-04-01 ENCOUNTER — Encounter: Payer: Self-pay | Admitting: Cardiology

## 2020-04-01 ENCOUNTER — Telehealth (INDEPENDENT_AMBULATORY_CARE_PROVIDER_SITE_OTHER): Payer: PPO | Admitting: Cardiology

## 2020-04-01 VITALS — Ht 71.0 in | Wt 212.0 lb

## 2020-04-01 DIAGNOSIS — Z8249 Family history of ischemic heart disease and other diseases of the circulatory system: Secondary | ICD-10-CM

## 2020-04-01 DIAGNOSIS — R072 Precordial pain: Secondary | ICD-10-CM | POA: Diagnosis not present

## 2020-04-01 DIAGNOSIS — I998 Other disorder of circulatory system: Secondary | ICD-10-CM

## 2020-04-01 DIAGNOSIS — I2584 Coronary atherosclerosis due to calcified coronary lesion: Secondary | ICD-10-CM | POA: Diagnosis not present

## 2020-04-01 DIAGNOSIS — E7800 Pure hypercholesterolemia, unspecified: Secondary | ICD-10-CM | POA: Diagnosis not present

## 2020-04-01 DIAGNOSIS — Z7189 Other specified counseling: Secondary | ICD-10-CM | POA: Diagnosis not present

## 2020-04-01 DIAGNOSIS — I2510 Atherosclerotic heart disease of native coronary artery without angina pectoris: Secondary | ICD-10-CM

## 2020-04-01 NOTE — Progress Notes (Signed)
Virtual Visit via Telephone Note   This visit type was conducted due to national recommendations for restrictions regarding the COVID-19 Pandemic (e.g. social distancing) in an effort to limit this patient's exposure and mitigate transmission in our community.  Due to his co-morbid illnesses, this patient is at least at moderate risk for complications without adequate follow up.  This format is felt to be most appropriate for this patient at this time.  The patient did not have access to video technology/had technical difficulties with video requiring transitioning to audio format only (telephone).  All issues noted in this document were discussed and addressed.  No physical exam could be performed with this format.  Please refer to the patient's chart for his  consent to telehealth for [facility].   The patient was identified using 2 identifiers.  Patient Location: Home Provider Location: Home Office  Date:  04/01/2020   ID:  Trevor Ross, Trevor Ross July 03, 1945, MRN [id number]  PCP:  Tonia Ghent, MD  Cardiologist:  Buford Dresser, MD  Referring MD: Tonia Ghent, MD   CC: follow up  History of Present Illness:    Trevor Ross is a 74 y.o. male with a hx of arthritis, vascular calcification who is seen for follow up today. I initially met him 01/08/20 as a new consult at the request of Tonia Ghent, MD for the evaluation and management of possible coronary artery disease and chest pain.  Today: Still having sharp pains on the right side. Sometimes can be a pressure, usually more of a sharp pain. Can move from side to side. Has been keeping a log; any time of day, not related to food, both exertional and nonexertional. Not improved with PPI. Has not taken a nitro, but had one episode where he thought he might need to. Has occasional lightheadedness, has not been checking blood pressure, will start checking especially if he feels poorly.  We reviewed CT, medications.  Discussed options. Discussed both cardiac and noncardiac chest pain. We will trial the following: if he has pain, take a nitro. Log if the nitro makes it better or worse. If one nitro is no change, take a tylenol. Log if the tylenol makes a difference.  Counseled on red flag warning signs. Will touch base in 1 week, if chest pain not affected then would discuss cath.  Past Medical History:  Diagnosis Date   Allergy    Arthritis    hands   Diverticulosis of colon 09/15/2006   Dysphagia    Hiatal hernia    Nasal polyps    Nephrolithiasis    PSA per Dr. Serita Butcher   OSA (obstructive sleep apnea)     Past Surgical History:  Procedure Laterality Date   APPENDECTOMY  1988   CARPAL TUNNEL RELEASE Left    CATARACT EXTRACTION Bilateral    Left knee repair  1970's   LITHOTRIPSY     twice per pt   right knee surgery     arthroscopic   Sinus surgery for polyps      Current Medications: Current Outpatient Medications on File Prior to Visit  Medication Sig   aspirin EC 81 MG tablet Take 1 tablet (81 mg total) by mouth daily. Swallow whole.   isosorbide mononitrate (IMDUR) 30 MG 24 hr tablet Take 1 tablet (30 mg total) by mouth daily.   multivitamin-lutein (OCUVITE-LUTEIN) CAPS capsule Take 1 capsule by mouth daily.   nitroGLYCERIN (NITROSTAT) 0.4 MG SL tablet Place 1  tablet (0.4 mg total) under the tongue every 5 (five) minutes as needed for chest pain (max 3 doses).   omeprazole (PRILOSEC) 20 MG capsule TAKE 1 CAPSULE BY MOUTH ONCE DAILY   Phenylephrine HCl (SINEX REGULAR NA) Place into the nose. Use daily as needed   rosuvastatin (CRESTOR) 20 MG tablet Take 1 tablet (20 mg total) by mouth daily.   No current facility-administered medications on file prior to visit.     Allergies:   Patient has no known allergies.   Social History   Tobacco Use   Smoking status: Never Smoker   Smokeless tobacco: Never Used  Vaping Use   Vaping Use: Never used    Substance Use Topics   Alcohol use: Yes    Alcohol/week: 0.0 standard drinks    Comment: rare   Drug use: No    Family History: family history includes Heart disease in his mother; Prostate cancer in his father. There is no history of Colon cancer. mother had CHF, passed away age 25. Father died suddenly, presumed heart attack, at age 56. Mat Gma had cancer, may have had heart issues as well. Mat Gpa died in his 31s, unknown cause. [name] died in his 40s, unknown cause. [name] also died of unknown causes.  ROS:   Please see the history of present illness.  Additional pertinent ROS otherwise unremarkable.  EKGs/Labs/Other Studies Reviewed:    The following studies were reviewed today: CT cardiac 01/16/20 RCA is a large dominant artery that gives rise to PDA and PLA. Calcified plaque in noted throughout the RCA causing moderate stenosis in the proximal and mid segments (50-69%) and mild stenosis in the distal segment (25-49%)  Left main is a large artery that gives rise to LAD and LCX arteries. The is mild calcified plaque in the distal LM causing minimal stenosis (0-24%)  LAD is a large vessel that has calcified plaque in the proximal to mid segments causing moderate stenosis (50-69%).  LCX is a non-dominant artery that gives rise to one large OM1 branch. There is calcified plaque in the proximal first obtuse marginal branch causing mild stenosis (25-49%).  Other findings:  Normal pulmonary vein drainage into the left atrium.  Normal left atrial appendage without a thrombus.  Normal size of the pulmonary artery.  IMPRESSION: 1. High coronary calcium score of 2616. This was 94th percentile for age and sex matched control ([place]).  2. Normal coronary origin with right dominance.  3. Heavy calcified plaque causing moderate stenosis (50-69%) in the proximal to mid segments of the LAD and RCA  4. CAD-RADS 3. Moderate stenosis. Consider  symptom-guided anti-ischemic pharmacotherapy as well as risk factor modification per guideline directed care.  Additional analysis with CT FFR will be submitted and reported separately.  5.  Asa, statin recommended if no contraindications.  FFR 01/16/20 1. Left Main:  No significant stenosis.  2. LAD: No significant focal stenosis. FFR 0.93 proximally, FFR 0.84 in the mid vessel, and slowly tapers to 0.75 distally with no focal stenosis 3. LCX: No significant stenosis. FFR 0.9 proximally, FFR 0.89 distally 4. RCA: No significant stenosis.  FFR 0.92  IMPRESSION: 1.  CT FFR analysis didn't show any significant focal stenosis.  EKG:  EKG is personally reviewed.  The ekg ordered today demonstrates sinus bradycardia, 1st degree AV block, HR 56 bpm  Recent Labs: 11/14/2019: Hemoglobin 13.9; Platelets 275 01/08/2020: BUN 17; Creatinine, Ser 1.11; Potassium 4.9; Sodium 139 03/06/2020: ALT 12  Recent Lipid Panel  Component Value Date/Time   CHOL 112 03/06/2020 0941   TRIG 74 03/06/2020 0941   HDL 50 03/06/2020 0941   CHOLHDL 2.2 03/06/2020 0941   CHOLHDL 4 02/15/2019 0853   VLDL 17.6 02/15/2019 0853   LDLCALC 47 03/06/2020 0941    Physical Exam:    VS:  Ht 5' 11" (1.803 m)    Wt 212 lb (96.2 kg)    BMI 29.57 kg/m     Wt Readings from Last 3 Encounters:  04/01/20 212 lb (96.2 kg)  03/13/20 207 lb (93.9 kg)  01/30/20 213 lb 6.4 oz (96.8 kg)    Speaking comfortably on the phone, no audible wheezing In no acute distress Alert and oriented Normal affect Normal speech  ASSESSMENT:    1. Precordial pain   2. Coronary artery disease due to calcified coronary lesion   3. Pure hypercholesterolemia   4. Cardiac risk counseling   5. Counseling on health promotion and disease prevention   6. FH: CAD (coronary artery disease)   7. Vascular calcification    PLAN:    Precordial pain CAD on CT cardiac Vascular calcification Family history of heart disease -we again  reviewed his CT results. This shows elevated calcium score and moderate CAD, but FFR was negative for flow limiting obstruction -he is now on aspirin 81 mg daily and rosuvastatin 20 mg daily -we started imdur, but this has not improved his symptoms, and it has affected his ability to use PDE5i, which has impacted his ability to manage his erectile dysfunction -has PRN SL NG, understands how to use and risk with PDE5i -counseled to try nitro with next chest pain. If does not improve, then try tylenol -counseled on red flag warning signs that need immediate medical attention -his chest pain symptoms are somewhat atypical given timing and quality, but they are affecting his life. Though CT cardiac and FFR did not suggest flow limiting disease, if no other clear etiology can be found, would consider cardiac catheterization for gold standard evaluation, especially given high amount of calcium on CT. Discussed with patient and his wife today, they are amenable.  Hypercholesterolemia -continue rosuvastatin 20 mg daily given CAD, vascular calcification -recheck lipids 03/06/20 show LDL has gone from 128 to 47, at goal of <70  Cardiac risk counseling and prevention recommendations: -recommend heart healthy/Mediterranean diet, with whole grains, fruits, vegetable, fish, lean meats, nuts, and olive oil. Limit salt. -recommend moderate walking, 3-5 times/week for 30-50 minutes each session. Aim for at least 150 minutes.week. Goal should be pace of 3 miles/hours, or walking 1.5 miles in 30 minutes -recommend avoidance of tobacco products. Avoid excess alcohol. -Additional risk factor control:  -diabetes risk: A1c 5.6  -Blood pressure control: at goal today  -Weight: BMI 29  Plan for follow up: touch base in 1 week, consider cath if symptoms persist without a clear pattern  Today, I have spent 20 minutes with the patient with telehealth technology discussing the above problems.  Additional time spent in  chart review, documentation, and communication.  Buford Dresser, MD, PhD [place]   [facility]    Medication Adjustments/Labs and Tests Ordered: Current medicines are reviewed at length with the patient today.  Concerns regarding medicines are outlined above.  No orders of the defined types were placed in this encounter.  No orders of the defined types were placed in this encounter.   Patient Instructions  Medication Instructions:  No Changes In Medications at this time.  *If you need a  refill on your cardiac medications before your next appointment, please call your pharmacy*  Follow-Up: At [facility], you and your health needs are our priority.  As part of our continuing mission to provide you with exceptional heart care, we have created designated Provider Care Teams.  These Care Teams include your primary Cardiologist (physician) and Advanced Practice Providers (APPs -  Physician Assistants and Nurse Practitioners) who all work together to provide you with the care you need, when you need it.  Your next appointment:   04/07/20 at 9:20  The format for your next appointment:   In Person  Provider:   Buford Dresser, MD     Signed, Buford Dresser, MD PhD 04/01/2020     [place]

## 2020-04-01 NOTE — Patient Instructions (Signed)
Medication Instructions:  No Changes In Medications at this time.  *If you need a refill on your cardiac medications before your next appointment, please call your pharmacy*  Follow-Up: At [facility], you and your health needs are our priority.  As part of our continuing mission to provide you with exceptional heart care, we have created designated Provider Care Teams.  These Care Teams include your primary Cardiologist (physician) and Advanced Practice Providers (APPs -  Physician Assistants and Nurse Practitioners) who all work together to provide you with the care you need, when you need it.  Your next appointment:   04/07/20 at 9:20  The format for your next appointment:   In Person  Provider:   Buford Dresser, MD

## 2020-04-02 ENCOUNTER — Encounter: Payer: Self-pay | Admitting: Cardiology

## 2020-04-07 ENCOUNTER — Encounter: Payer: Self-pay | Admitting: Cardiology

## 2020-04-07 ENCOUNTER — Telehealth (INDEPENDENT_AMBULATORY_CARE_PROVIDER_SITE_OTHER): Payer: PPO | Admitting: Cardiology

## 2020-04-07 VITALS — BP 146/83 | HR 62 | Ht 71.0 in | Wt 214.0 lb

## 2020-04-07 DIAGNOSIS — I2510 Atherosclerotic heart disease of native coronary artery without angina pectoris: Secondary | ICD-10-CM | POA: Diagnosis not present

## 2020-04-07 DIAGNOSIS — E7800 Pure hypercholesterolemia, unspecified: Secondary | ICD-10-CM | POA: Diagnosis not present

## 2020-04-07 DIAGNOSIS — Z8249 Family history of ischemic heart disease and other diseases of the circulatory system: Secondary | ICD-10-CM | POA: Diagnosis not present

## 2020-04-07 DIAGNOSIS — Z79899 Other long term (current) drug therapy: Secondary | ICD-10-CM | POA: Diagnosis not present

## 2020-04-07 DIAGNOSIS — I2584 Coronary atherosclerosis due to calcified coronary lesion: Secondary | ICD-10-CM

## 2020-04-07 DIAGNOSIS — R072 Precordial pain: Secondary | ICD-10-CM | POA: Diagnosis not present

## 2020-04-07 MED ORDER — ISOSORBIDE MONONITRATE ER 60 MG PO TB24: 60.0000 mg | ORAL_TABLET | Freq: Every day | ORAL | 3 refills | Status: DC

## 2020-04-07 NOTE — Patient Instructions (Addendum)
Medication Instructions:  Start taking imdur (isosorbide) 60 mg daily. If that completely relieves the chest pain, stay at that dose. If you still have intermittent chest pain at 60 mg dose after 1-2 weeks, increase to 120 mg daily. Call if you have more lightheadedness or low blood pressure  *If you need a refill on your cardiac medications before your next appointment, please call your pharmacy*   Lab Work: None   Testing/Procedures: Non   Follow-Up: At [facility], you and your health needs are our priority.  As part of our continuing mission to provide you with exceptional heart care, we have created designated Provider Care Teams.  These Care Teams include your primary Cardiologist (physician) and Advanced Practice Providers (APPs -  Physician Assistants and Nurse Practitioners) who all work together to provide you with the care you need, when you need it.  We recommend signing up for the patient portal called "MyChart".  Sign up information is provided on this After Visit Summary.  MyChart is used to connect with patients for Virtual Visits (Telemedicine).  Patients are able to view lab/test results, encounter notes, upcoming appointments, etc.  Non-urgent messages can be sent to your provider as well.   To learn more about what you can do with MyChart, go to [email or web address].    Your next appointment:   6 week(s)  The format for your next appointment:   In Person or virtual  Provider:   Buford Dresser, MD

## 2020-04-07 NOTE — Progress Notes (Signed)
Virtual Visit via Telephone Note   This visit type was conducted due to national recommendations for restrictions regarding the COVID-19 Pandemic (e.g. social distancing) in an effort to limit this patient's exposure and mitigate transmission in our community.  Due to his co-morbid illnesses, this patient is at least at moderate risk for complications without adequate follow up.  This format is felt to be most appropriate for this patient at this time.  The patient did not have access to video technology/had technical difficulties with video requiring transitioning to audio format only (telephone).  All issues noted in this document were discussed and addressed.  No physical exam could be performed with this format.  Please refer to the patient's chart for his  consent to telehealth for [facility].   The patient was identified using 2 identifiers.  Patient Location: Home Provider Location: Home Office  Date:  04/07/2020   ID:  Trevor Ross, Trevor Ross, MRN [id number]  PCP:  Tonia Ghent, MD  Cardiologist:  Buford Dresser, MD  Referring MD: Tonia Ghent, MD   CC: follow up  History of Present Illness:    Trevor Ross is a 74 y.o. male with a hx of arthritis, vascular calcification who is seen for follow up today. I initially met him 01/08/20 as a new consult at the request of Tonia Ghent, MD for the evaluation and management of possible coronary artery disease and chest pain.  Today: Close follow up given monitoring of symptoms. Did well at the beach for several days, no pain. Sunday night had chest pain/pressure, improved with one nitro. Yesterday had another pressure/pain across his chest, took a nitro and it helped. Both times has gone away with one nitroglycerin. Had a mild headache after. Has had issues with lightheadedness with changing position, such as when he goes from lying down under the car to getting up.  Confirmed that labs from 01/15/20 are his  wife's lipids (she is also my patient; labs are double entered and exactly the same on the same day, and she was the only one who had blood drawn that day).  Discussed options for next steps, see below.  Denies shortness of breath at rest or with normal exertion. No PND, orthopnea, LE edema or unexpected weight gain. No syncope or palpitations.  Past Medical History:  Diagnosis Date  . Allergy   . Arthritis    hands  . Diverticulosis of colon 09/15/2006  . Dysphagia   . Hiatal hernia   . Nasal polyps   . Nephrolithiasis    PSA per Dr. Serita Butcher  . OSA (obstructive sleep apnea)     Past Surgical History:  Procedure Laterality Date  . APPENDECTOMY  1988  . CARPAL TUNNEL RELEASE Left   . CATARACT EXTRACTION Bilateral   . Left knee repair  1970's  . LITHOTRIPSY     twice per pt  . right knee surgery     arthroscopic  . Sinus surgery for polyps      Current Medications: Current Outpatient Medications on File Prior to Visit  Medication Sig  . aspirin EC 81 MG tablet Take 1 tablet (81 mg total) by mouth daily. Swallow whole.  . isosorbide mononitrate (IMDUR) 30 MG 24 hr tablet Take 1 tablet (30 mg total) by mouth daily.  . multivitamin-lutein (OCUVITE-LUTEIN) CAPS capsule Take 1 capsule by mouth daily.  . nitroGLYCERIN (NITROSTAT) 0.4 MG SL tablet Place 1 tablet (0.4 mg total) under the tongue every 5 (  five) minutes as needed for chest pain (max 3 doses).  Marland Kitchen omeprazole (PRILOSEC) 20 MG capsule TAKE 1 CAPSULE BY MOUTH ONCE DAILY  . Phenylephrine HCl (SINEX REGULAR NA) Place into the nose. Use daily as needed  . rosuvastatin (CRESTOR) 20 MG tablet Take 1 tablet (20 mg total) by mouth daily.   No current facility-administered medications on file prior to visit.     Allergies:   Patient has no known allergies.   Social History   Tobacco Use  . Smoking status: Never Smoker  . Smokeless tobacco: Never Used  Vaping Use  . Vaping Use: Never used  Substance Use Topics  .  Alcohol use: Yes    Alcohol/week: 0.0 standard drinks    Comment: rare  . Drug use: No    Family History: family history includes Heart disease in his mother; Prostate cancer in his father. There is no history of Colon cancer. mother had CHF, passed away age 30. Father died suddenly, presumed heart attack, at age 44. Mat Gma had cancer, may have had heart issues as well. Mat Gpa died in his 67s, unknown cause. [name] died in his 88s, unknown cause. [name] also died of unknown causes.  ROS:   Please see the history of present illness.  Additional pertinent ROS otherwise unremarkable.  EKGs/Labs/Other Studies Reviewed:    The following studies were reviewed today: CT cardiac 01/16/20 RCA is a large dominant artery that gives rise to PDA and PLA. Calcified plaque in noted throughout the RCA causing moderate stenosis in the proximal and mid segments (50-69%) and mild stenosis in the distal segment (25-49%)  Left main is a large artery that gives rise to LAD and LCX arteries. The is mild calcified plaque in the distal LM causing minimal stenosis (0-24%)  LAD is a large vessel that has calcified plaque in the proximal to mid segments causing moderate stenosis (50-69%).  LCX is a non-dominant artery that gives rise to one large OM1 branch. There is calcified plaque in the proximal first obtuse marginal branch causing mild stenosis (25-49%).  Other findings:  Normal pulmonary vein drainage into the left atrium.  Normal left atrial appendage without a thrombus.  Normal size of the pulmonary artery.  IMPRESSION: 1. High coronary calcium score of 2616. This was 94th percentile for age and sex matched control ([facility] [place]).  2. Normal coronary origin with right dominance.  3. Heavy calcified plaque causing moderate stenosis (50-69%) in the proximal to mid segments of the LAD and RCA  4. CAD-RADS 3. Moderate stenosis. Consider symptom-guided anti-ischemic pharmacotherapy  as well as risk factor modification per guideline directed care.  Additional analysis with CT FFR will be submitted and reported separately.  5.  Asa, statin recommended if no contraindications.  FFR 01/16/20 1. Left Main:  No significant stenosis.  2. LAD: No significant focal stenosis. FFR 0.93 proximally, FFR 0.84 in the mid vessel, and slowly tapers to 0.75 distally with no focal stenosis 3. LCX: No significant stenosis. FFR 0.9 proximally, FFR 0.89 distally 4. RCA: No significant stenosis.  FFR 0.92  IMPRESSION: 1.  CT FFR analysis didn't show any significant focal stenosis.  EKG:  EKG is personally reviewed.  The ekg ordered 01/08/20 demonstrates sinus bradycardia, 1st degree AV block, HR 56 bpm  Recent Labs: 11/14/2019: Hemoglobin 13.9; Platelets 275 01/08/2020: BUN 17; Creatinine, Ser 1.11; Potassium 4.9; Sodium 139 03/06/2020: ALT 12  Recent Lipid Panel    Component Value Date/Time   CHOL 112  03/06/2020 0941   TRIG 74 03/06/2020 0941   HDL 50 03/06/2020 0941   CHOLHDL 2.2 03/06/2020 0941   CHOLHDL 4 02/15/2019 0853   VLDL 17.6 02/15/2019 0853   LDLCALC 47 03/06/2020 0941    Physical Exam:    VS:  BP (!) 146/83   Pulse 62   Ht 5' 11"  (1.803 m)   Wt 214 lb (97.1 kg)   BMI 29.85 kg/m     Wt Readings from Last 3 Encounters:  04/07/20 214 lb (97.1 kg)  04/01/20 212 lb (96.2 kg)  03/13/20 207 lb (93.9 kg)    Speaking comfortably on the phone, no audible wheezing In no acute distress Alert and oriented Normal affect Normal speech  ASSESSMENT:    1. Coronary artery disease due to calcified coronary lesion   2. Precordial pain   3. Pure hypercholesterolemia   4. FH: CAD (coronary artery disease)   5. Medication management    PLAN:    Precordial pain CAD on CT cardiac Vascular calcification Family history of heart disease -continue aspirin 81 mg daily and rosuvastatin 20 mg daily -we started imdur, and he improved for a time and then had  breakthrough events. Discussed options. We will trial increasing imdur to see if this improves his symptoms. -has PRN SL NG, understands how to use and risk with PDE5i -counseled on red flag warning signs that need immediate medical attention -his chest pain symptoms are somewhat atypical given timing and quality, but they are affecting his life. Though CT cardiac and FFR did not suggest flow limiting disease, if no other clear etiology can be found, would consider cardiac catheterization for gold standard evaluation, especially given high amount of calcium on CT. Discussed with patient and his wife today, they are amenable. Will trial increasing imdur and discuss at follow up  Hypercholesterolemia -continue rosuvastatin 20 mg daily given CAD, vascular calcification -recheck lipids 03/06/20 show LDL has gone from 128 to 47, at goal of <70  Cardiac risk counseling and prevention recommendations: -recommend heart healthy/Mediterranean diet, with whole grains, fruits, vegetable, fish, lean meats, nuts, and olive oil. Limit salt. -recommend moderate walking, 3-5 times/week for 30-50 minutes each session. Aim for at least 150 minutes.week. Goal should be pace of 3 miles/hours, or walking 1.5 miles in 30 minutes -recommend avoidance of tobacco products. Avoid excess alcohol. -Additional risk factor control:  -diabetes risk: A1c 5.6  -Blood pressure control: at goal today  -Weight: BMI 29  Plan for follow up: 6 weeks or sooner as needed  Today, I have spent 29 minutes with the patient with telehealth technology discussing the above problems.  Additional time spent in chart review, documentation, and communication.  Buford Dresser, MD, PhD [place]  [facility]    Medication Adjustments/Labs and Tests Ordered: Current medicines are reviewed at length with the patient today.  Concerns regarding medicines are outlined above.  No orders of the defined types were placed in this  encounter.  Meds ordered this encounter  Medications  . DISCONTD: isosorbide mononitrate (IMDUR) 60 MG 24 hr tablet    Sig: Take 1 tablet (60 mg total) by mouth daily.    Dispense:  90 tablet    Refill:  3    Patient Instructions  Medication Instructions:  Start taking imdur (isosorbide) 60 mg daily. If that completely relieves the chest pain, stay at that dose. If you still have intermittent chest pain at 60 mg dose after 1-2 weeks, increase to 120 mg daily. Call  if you have more lightheadedness or low blood pressure  *If you need a refill on your cardiac medications before your next appointment, please call your pharmacy*   Lab Work: None   Testing/Procedures: Non   Follow-Up: At [facility], you and your health needs are our priority.  As part of our continuing mission to provide you with exceptional heart care, we have created designated Provider Care Teams.  These Care Teams include your primary Cardiologist (physician) and Advanced Practice Providers (APPs -  Physician Assistants and Nurse Practitioners) who all work together to provide you with the care you need, when you need it.  We recommend signing up for the patient portal called "MyChart".  Sign up information is provided on this After Visit Summary.  MyChart is used to connect with patients for Virtual Visits (Telemedicine).  Patients are able to view lab/test results, encounter notes, upcoming appointments, etc.  Non-urgent messages can be sent to your provider as well.   To learn more about what you can do with MyChart, go to [email or web address].    Your next appointment:   6 week(s)  The format for your next appointment:   In Person or virtual  Provider:   Buford Dresser, MD      Signed, Buford Dresser, MD PhD 04/07/2020     [place]

## 2020-04-14 ENCOUNTER — Emergency Department (HOSPITAL_COMMUNITY): Payer: PPO

## 2020-04-14 ENCOUNTER — Telehealth: Payer: Self-pay | Admitting: Cardiology

## 2020-04-14 ENCOUNTER — Emergency Department (HOSPITAL_COMMUNITY)
Admission: EM | Admit: 2020-04-14 | Discharge: 2020-04-14 | Disposition: A | Discharge status: Home / Self Care | Payer: PPO | Attending: Emergency Medicine | Admitting: Emergency Medicine

## 2020-04-14 ENCOUNTER — Other Ambulatory Visit: Payer: Self-pay

## 2020-04-14 DIAGNOSIS — Z7982 Long term (current) use of aspirin: Secondary | ICD-10-CM | POA: Insufficient documentation

## 2020-04-14 DIAGNOSIS — I2510 Atherosclerotic heart disease of native coronary artery without angina pectoris: Secondary | ICD-10-CM | POA: Insufficient documentation

## 2020-04-14 DIAGNOSIS — R072 Precordial pain: Secondary | ICD-10-CM | POA: Diagnosis not present

## 2020-04-14 DIAGNOSIS — R079 Chest pain, unspecified: Secondary | ICD-10-CM | POA: Diagnosis not present

## 2020-04-14 LAB — BASIC METABOLIC PANEL
Anion gap: 10 (ref 5–15)
BUN: 15 mg/dL (ref 8–23)
CO2: 24 mmol/L (ref 22–32)
Calcium: 9.2 mg/dL (ref 8.9–10.3)
Chloride: 102 mmol/L (ref 98–111)
Creatinine, Ser: 1.21 mg/dL (ref 0.61–1.24)
GFR, Estimated: >60 mL/min (ref >60)
Glucose, Bld: 97 mg/dL (ref 70–99)
Potassium: 4.1 mmol/L (ref 3.5–5.1)
Sodium: 136 mmol/L (ref 135–145)

## 2020-04-14 LAB — CBC
HCT: 40.6 % (ref 39.0–52.0)
Hemoglobin: 13.7 g/dL (ref 13.0–17.0)
MCH: 31.6 pg (ref 26.0–34.0)
MCHC: 33.7 g/dL (ref 30.0–36.0)
MCV: 93.8 fL (ref 80.0–100.0)
Platelets: 248 10*3/uL (ref 150–400)
RBC: 4.33 MIL/uL (ref 4.22–5.81)
RDW: 12.1 % (ref 11.5–15.5)
WBC: 8.9 10*3/uL (ref 4.0–10.5)
nRBC: 0 % (ref 0.0–0.2)

## 2020-04-14 LAB — TROPONIN I (HIGH SENSITIVITY)
Troponin I (High Sensitivity): 4 ng/L (ref <18)
Troponin I (High Sensitivity): 4 ng/L (ref <18)

## 2020-04-14 MED ORDER — AMLODIPINE BESYLATE 2.5 MG PO TABS: 2.5000 mg | ORAL_TABLET | Freq: Every day | ORAL | 0 refills | Status: DC

## 2020-04-14 MED ORDER — LIDOCAINE VISCOUS HCL 2 % MT SOLN
15.0000 mL | Freq: Once | OROMUCOSAL | Status: AC
  Administered 2020-04-14: 15 mL via ORAL
  Filled 2020-04-14: qty 15

## 2020-04-14 MED ORDER — ALUM & MAG HYDROXIDE-SIMETH 200-200-20 MG/5ML PO SUSP
30.0000 mL | Freq: Once | ORAL | Status: AC
  Administered 2020-04-14: 30 mL via ORAL
  Filled 2020-04-14: qty 30

## 2020-04-14 MED ORDER — AMLODIPINE BESYLATE 5 MG PO TABS
2.5000 mg | ORAL_TABLET | Freq: Once | ORAL | Status: AC
  Administered 2020-04-14: 2.5 mg via ORAL
  Filled 2020-04-14: qty 1

## 2020-04-14 NOTE — ED Provider Notes (Signed)
[place] EMERGENCY DEPARTMENT Provider Note   CSN: [id number] Arrival date & time: 04/14/20  1506     History Chief Complaint  Patient presents with  . Chest Pain    Trevor Ross is a 74 y.o. male. He has a history of coronary calcification and follows with Dr. Harrell Gave cardiology. He was recently in the office and they increased his Imdur. He has been having on and off chest pain for the last few months. Occurred again this morning at rest 7 out of 10 intensity. Not associated with shortness of breath diaphoresis nausea. He took 3 nitro without improvement and presented to the emergency department. He rates his current pain is 3 out of 10. It substernal and slightly right. This is the second time he is taken nitroglycerin. No recent illness. No cough or shortness of breath. No fevers chills.  The history is provided by the patient.  Chest Pain Pain location:  Substernal area Pain quality: pressure   Pain radiates to:  Does not radiate Pain severity now: 7/10. Onset quality:  Gradual Timing:  Constant Progression:  Partially resolved Chronicity:  Recurrent Context: at rest   Relieved by:  Nothing Worsened by:  Nothing Ineffective treatments:  Nitroglycerin Associated symptoms: no abdominal pain, no back pain, no cough, no diaphoresis, no dizziness, no dysphagia, no fever, no heartburn, no lower extremity edema, no nausea, no numbness, no shortness of breath, no syncope, no vomiting and no weakness   Risk factors: coronary artery disease     HPI: A 74 year old patient presents for evaluation of chest pain. Initial onset of pain was approximately 3-6 hours ago. The patient's chest pain is described as heaviness/pressure/tightness, is not worse with exertion and is relieved by nitroglycerin. The patient's chest pain is middle- or left-sided, is not well-localized, is not sharp and does not radiate to the arms/jaw/neck. The patient does not complain of nausea  and denies diaphoresis. The patient has a family history of coronary artery disease in a first-degree relative with onset less than age 56. The patient has no history of stroke, has no history of peripheral artery disease, has not smoked in the past 90 days, denies any history of treated diabetes, is not hypertensive, has no history of hypercholesterolemia and does not have an elevated BMI (>=30).   Past Medical History:  Diagnosis Date  . Allergy   . Arthritis    hands  . Diverticulosis of colon 09/15/2006  . Dysphagia   . Hiatal hernia   . Nasal polyps   . Nephrolithiasis    PSA per Dr. Serita Butcher  . OSA (obstructive sleep apnea)     Patient Active Problem List   Diagnosis Date Noted  . FH: CAD (coronary artery disease) 11/20/2019  . Shoulder pain 11/20/2019  . Right foot pain 10/07/2019  . Medicare annual wellness visit, subsequent 02/20/2019  . OSA (obstructive sleep apnea) 02/20/2019  . Erectile dysfunction 02/15/2018  . Nasal congestion 04/27/2017  . Healthcare maintenance 02/15/2017  . Advance care planning 02/15/2017  . GERD (gastroesophageal reflux disease) 02/15/2017  . Hyperglycemia 02/15/2017  . Snoring 12/07/2016  . Low back pain 09/29/2014  . Knee pain 05/29/2012  . Nephrolithiasis 04/28/2012  . BENIGN POSITIONAL VERTIGO 02/13/2009    Past Surgical History:  Procedure Laterality Date  . APPENDECTOMY  1988  . CARPAL TUNNEL RELEASE Left   . CATARACT EXTRACTION Bilateral   . Left knee repair  1970's  . LITHOTRIPSY     twice per pt  .  right knee surgery     arthroscopic  . Sinus surgery for polyps         Family History  Problem Relation Age of Onset  . Heart disease Mother   . Prostate cancer Father   . Colon cancer Neg Hx     Social History   Tobacco Use  . Smoking status: Never Smoker  . Smokeless tobacco: Never Used  Vaping Use  . Vaping Use: Never used  Substance Use Topics  . Alcohol use: Yes    Alcohol/week: 0.0 standard drinks     Comment: rare  . Drug use: No    Home Medications Prior to Admission medications   Medication Sig Start Date End Date Taking? Authorizing Provider  aspirin EC 81 MG tablet Take 1 tablet (81 mg total) by mouth daily. Swallow whole. 01/08/20   Buford Dresser, MD  isosorbide mononitrate (IMDUR) 60 MG 24 hr tablet Take 1 tablet (60 mg total) by mouth daily. 04/07/20 07/06/20  Buford Dresser, MD  multivitamin-lutein [facility]) CAPS capsule Take 1 capsule by mouth daily.    [provider]  nitroGLYCERIN (NITROSTAT) 0.4 MG SL tablet Place 1 tablet (0.4 mg total) under the tongue every 5 (five) minutes as needed for chest pain (max 3 doses). 01/30/20 04/29/20  Buford Dresser, MD  omeprazole (PRILOSEC) 20 MG capsule TAKE 1 CAPSULE BY MOUTH ONCE DAILY 12/16/19   Tonia Ghent, MD  Phenylephrine HCl [facility] REGULAR NA) Place into the nose. Use daily as needed    [provider]  rosuvastatin (CRESTOR) 20 MG tablet Take 1 tablet (20 mg total) by mouth daily. 01/08/20 04/07/20  Buford Dresser, MD    Allergies    Patient has no known allergies.  Review of Systems   Review of Systems  Constitutional: Negative for diaphoresis and fever.  HENT: Negative for sore throat and trouble swallowing.   Eyes: Negative for visual disturbance.  Respiratory: Negative for cough and shortness of breath.   Cardiovascular: Positive for chest pain. Negative for syncope.  Gastrointestinal: Negative for abdominal pain, heartburn, nausea and vomiting.  Genitourinary: Negative for dysuria.  Musculoskeletal: Negative for back pain.  Skin: Negative for rash.  Neurological: Negative for dizziness, weakness and numbness.    Physical Exam Updated Vital Signs BP (!) 173/92 (BP Location: Right Arm)   Pulse 60   Temp 98.4 F (36.9 C) (Oral)   Resp 18   SpO2 97%   Physical Exam Vitals and nursing note reviewed.  Constitutional:      Appearance: He is well-developed.   HENT:     Head: Normocephalic and atraumatic.  Eyes:     Conjunctiva/sclera: Conjunctivae normal.  Cardiovascular:     Rate and Rhythm: Normal rate and regular rhythm.     Heart sounds: Normal heart sounds. No murmur heard.   Pulmonary:     Effort: Pulmonary effort is normal. No respiratory distress.     Breath sounds: Normal breath sounds.  Abdominal:     Palpations: Abdomen is soft.     Tenderness: There is no abdominal tenderness.  Musculoskeletal:        General: Normal range of motion.     Cervical back: Neck supple.     Right lower leg: No tenderness. No edema.     Left lower leg: No tenderness. No edema.  Skin:    General: Skin is warm and dry.     Capillary Refill: Capillary refill takes less than 2 seconds.  Neurological:  General: No focal deficit present.     Mental Status: He is alert.     GCS: GCS eye subscore is 4. GCS verbal subscore is 5. GCS motor subscore is 6.     ED Results / Procedures / Treatments   Labs (all labs ordered are listed, but only abnormal results are displayed) Labs Reviewed  BASIC METABOLIC PANEL  CBC  TROPONIN I (HIGH SENSITIVITY)  TROPONIN I (HIGH SENSITIVITY)    EKG EKG Interpretation  Date/Time:  Tuesday April 14 2020 15:17:46 EST Ventricular Rate:  69 PR Interval:  202 QRS Duration: 88 QT Interval:  416 QTC Calculation: 445 R Axis:   39 Text Interpretation: Normal sinus rhythm Normal ECG No significant change since prior 7/21 Confirmed by Aletta Edouard [phone]) on 04/14/2020 5:21:59 PM   Radiology DG Chest 2 View  Result Date: 04/14/2020 CLINICAL DATA:  Right-sided chest pain beginning today. EXAM: CHEST - 2 VIEW COMPARISON:  11/14/2019 FINDINGS: Heart size is normal. Aortic atherosclerotic calcification and tortuosity. The pulmonary vascularity is normal. The lungs are clear. No pneumothorax or hemothorax. No effusion. No visible bone abnormality. IMPRESSION: No active disease. Electronically Signed   By: Nelson Chimes M.D.   On: 04/14/2020 15:50    Procedures Procedures (including critical care time)  Medications Ordered in ED Medications  alum & mag hydroxide-simeth (MAALOX/MYLANTA) 200-200-20 MG/5ML suspension 30 mL (30 mLs Oral Given 04/14/20 1927)    And  lidocaine (XYLOCAINE) 2 % viscous mouth solution 15 mL (15 mLs Oral Given 04/14/20 1927)  amLODipine (NORVASC) tablet 2.5 mg (2.5 mg Oral Given 04/14/20 2033)    ED Course  I have reviewed the triage vital signs and the nursing notes.  Pertinent labs & imaging results that were available during my care of the patient were reviewed by me and considered in my medical decision making (see chart for details).  Clinical Course as of Apr 15 1145  Tue Apr 14, 2020  4782 Discussed with Dr. Radford Pax cardiology.  She agrees to story sounds atypical and appreciate reviewed his cardiac CT.  Recommends GI cocktail and delta troponin and call her back with results.   [MB]  1948 No change with GI cocktail.  Talked with Dr. Radford Pax about flat troponin.  She is recommending discharge and follow-up tomorrow with Dr. Harrell Gave.  Started on amlodipine 2.5 mg daily and give first dose in the ED.   [MB]    Clinical Course User Index [MB] Hayden Rasmussen, MD   MDM Rules/Calculators/A&P HEAR Score: 5                       This patient complains of chest pain; this involves an extensive number of treatment Options and is a complaint that carries with it a high risk of complications and Morbidity. The differential includes ACS, pneumonia, pneumothorax, PE, vascular, reflux, musculoskeletal  I ordered, reviewed and interpreted labs, which included CBC with normal white count normal hemoglobin, chemistries normal including normal renal function, delta troponin flat I ordered medication GI cocktail, oral amlodipine I ordered imaging studies which included chest x-ray and I independently    visualized and interpreted imaging which showed no acute pulmonary  disease Additional history obtained from patient's wife Previous records obtained and reviewed in epic including prior cardiac CT and cardiology notes I consulted Dr. Radford Pax cardiology and discussed lab and imaging findings  Critical Interventions: None  After the interventions stated above, I reevaluated the patient and found patient still  to have a little bit of pain.  Appears in no distress.  Reviewed recommendations from cardiology with him and he is comfortable with plan for starting on amlodipine and following up with Dr. Harrell Gave.  Return instructions discussed.   Final Clinical Impression(s) / ED Diagnoses Final diagnoses:  Nonspecific chest pain    Rx / DC Orders ED Discharge Orders         Ordered    amLODipine (NORVASC) 2.5 MG tablet  Daily        04/14/20 2007           Hayden Rasmussen, MD 04/15/20 1150

## 2020-04-14 NOTE — ED Triage Notes (Signed)
Pt here with R sided non-radiating chest pain since 1100 this morning. Denies shob, n/v or other associated symptoms. Took 3 nitro at home with minimal relief. Reports having two blockages for which he is being followed by cardiology.

## 2020-04-14 NOTE — Telephone Encounter (Signed)
Spoke with pt wife, aware I will let dr Harrell Gave is in the ER.

## 2020-04-14 NOTE — Telephone Encounter (Signed)
° °  Pt's wife calling, she would like to let Dr. Harrell Gave know, pt is in [facility] ED due to CP. pt took 3 nitroglycerin with no relief so they decided to bring him to the hospital

## 2020-04-14 NOTE — Telephone Encounter (Signed)
ER MD called to discuss patient.  Symptoms are atypical with sharp right sided CP with only mild improvement with NTG and no change with GI cocktail.  hsTrop 4 x 2 and EKG is nonischemic.  Coronary CTA reviewed with moderate disease in the prox to mid LAD and normal FFR.  He was significantly hypertensive on presentation with BP 173/12mmHg and HR 60 and normal O2 sats 97% on RA.  Minimal sharp residual CP.  Will start on amlodipine 2.5mg  daily.  Please get patient in to see Dr. Harrell Gave on 12/8 for followup.

## 2020-04-14 NOTE — Discharge Instructions (Signed)
You were seen in the emergency department for some right-sided chest pain.  You had blood work EKG and a chest x-ray that did not show an obvious cause of your symptoms.  We reviewed your case with Dr. Radford Pax cardiology and she recommended starting you on amlodipine once daily.  The cardiology office should call you tomorrow with a follow-up appointment with Dr. Harrell Gave.  If you experience any worsening or concerning symptoms please return to the emergency department.

## 2020-04-15 NOTE — Telephone Encounter (Signed)
Called pt to schedule appointment. Wife and patient questioning if MD is ok with a virtual visit instead. Nurse consulted with MD's and wife made aware Dr. Harrell Gave is ok with pt keeping scheduled appointment for 1/12 or scheduling a virtual appointment this week. Wife voiced she will talk with pt and call back to schedule.

## 2020-04-15 NOTE — Telephone Encounter (Signed)
Discussed with Lars Mage RN, she will talk with Dr Harrell Gave regarding appointment

## 2020-04-15 NOTE — Telephone Encounter (Signed)
Per chart review, pt called and schedule appointment for 12/10.

## 2020-04-17 ENCOUNTER — Encounter: Payer: Self-pay | Admitting: Cardiology

## 2020-04-17 ENCOUNTER — Telehealth (INDEPENDENT_AMBULATORY_CARE_PROVIDER_SITE_OTHER): Payer: PPO | Admitting: Cardiology

## 2020-04-17 VITALS — BP 139/75 | HR 67 | Ht 71.0 in | Wt 199.0 lb

## 2020-04-17 DIAGNOSIS — I2510 Atherosclerotic heart disease of native coronary artery without angina pectoris: Secondary | ICD-10-CM

## 2020-04-17 DIAGNOSIS — E7800 Pure hypercholesterolemia, unspecified: Secondary | ICD-10-CM | POA: Diagnosis not present

## 2020-04-17 DIAGNOSIS — I10 Essential (primary) hypertension: Secondary | ICD-10-CM | POA: Diagnosis not present

## 2020-04-17 DIAGNOSIS — R072 Precordial pain: Secondary | ICD-10-CM

## 2020-04-17 DIAGNOSIS — I2584 Coronary atherosclerosis due to calcified coronary lesion: Secondary | ICD-10-CM | POA: Diagnosis not present

## 2020-04-17 MED ORDER — ISOSORBIDE MONONITRATE ER 60 MG PO TB24: 30.0000 mg | ORAL_TABLET | Freq: Every day | ORAL | 3 refills | Status: DC

## 2020-04-17 NOTE — Patient Instructions (Signed)
Medication Instructions:  Decrease Imdur to 30 mg daily  *If you need a refill on your cardiac medications before your next appointment, please call your pharmacy*   Lab Work: None   Testing/Procedures: None   Follow-Up: At [facility], you and your health needs are our priority.  As part of our continuing mission to provide you with exceptional heart care, we have created designated Provider Care Teams.  These Care Teams include your primary Cardiologist (physician) and Advanced Practice Providers (APPs -  Physician Assistants and Nurse Practitioners) who all work together to provide you with the care you need, when you need it.  We recommend signing up for the patient portal called "MyChart".  Sign up information is provided on this After Visit Summary.  MyChart is used to connect with patients for Virtual Visits (Telemedicine).  Patients are able to view lab/test results, encounter notes, upcoming appointments, etc.  Non-urgent messages can be sent to your provider as well.   To learn more about what you can do with MyChart, go to [email or web address].    Your next appointment:   May 20, 2020 @ 9:20 am   The format for your next appointment:   In Person  Provider:   Buford Dresser, MD

## 2020-04-17 NOTE — Progress Notes (Signed)
Virtual Visit via Telephone Note   This visit type was conducted due to national recommendations for restrictions regarding the COVID-19 Pandemic (e.g. social distancing) in an effort to limit this patient's exposure and mitigate transmission in our community.  Due to his co-morbid illnesses, this patient is at least at moderate risk for complications without adequate follow up.  This format is felt to be most appropriate for this patient at this time.  The patient did not have access to video technology/had technical difficulties with video requiring transitioning to audio format only (telephone).  All issues noted in this document were discussed and addressed.  No physical exam could be performed with this format.  Please refer to the patient's chart for his  consent to telehealth for [facility].   The patient was identified using 2 identifiers.  Patient Location: Home Provider Location: Office/Clinic  Date:  04/17/2020   ID:  Trevor Ross, DOB 1945-09-16, MRN [id number]  PCP:  Tonia Ghent, MD  Cardiologist:  Buford Dresser, MD  Referring MD: Tonia Ghent, MD   CC: follow up  History of Present Illness:    Trevor Ross is a 74 y.o. male with a hx of arthritis, vascular calcification who is seen for follow up today. I initially met him 01/08/20 as a new consult at the request of Tonia Ghent, MD for the evaluation and management of possible coronary artery disease and chest pain.  Today: Seen for post ER follow up. Note reviewed.   Had pain that AM, came on with rest. Worse than normal. Not improved with nitro x3. Came to ER. Pain persisted all day, didn't get better until got him home. Wife says he was very pale.   Taking amlodipine since er visit given elevation at that time. Blood pressure has been in 120s/70s  Denies shortness of breath at rest or with normal exertion. No PND, orthopnea, LE edema or unexpected weight gain. No syncope or palpitations.    Past Medical History:  Diagnosis Date   Allergy    Arthritis    hands   Diverticulosis of colon 09/15/2006   Dysphagia    Hiatal hernia    Nasal polyps    Nephrolithiasis    PSA per Dr. Serita Butcher   OSA (obstructive sleep apnea)     Past Surgical History:  Procedure Laterality Date   APPENDECTOMY  1988   CARPAL TUNNEL RELEASE Left    CATARACT EXTRACTION Bilateral    Left knee repair  1970's   LITHOTRIPSY     twice per pt   right knee surgery     arthroscopic   Sinus surgery for polyps      Current Medications: Current Outpatient Medications on File Prior to Visit  Medication Sig   amLODipine (NORVASC) 2.5 MG tablet Take 1 tablet (2.5 mg total) by mouth daily.   aspirin EC 81 MG tablet Take 1 tablet (81 mg total) by mouth daily. Swallow whole.   isosorbide mononitrate (IMDUR) 60 MG 24 hr tablet Take 1 tablet (60 mg total) by mouth daily.   multivitamin-lutein (OCUVITE-LUTEIN) CAPS capsule Take 1 capsule by mouth daily.   nitroGLYCERIN (NITROSTAT) 0.4 MG SL tablet Place 1 tablet (0.4 mg total) under the tongue every 5 (five) minutes as needed for chest pain (max 3 doses).   omeprazole (PRILOSEC) 20 MG capsule TAKE 1 CAPSULE BY MOUTH ONCE DAILY   Phenylephrine HCl (SINEX REGULAR NA) Place into the nose. Use daily as needed  rosuvastatin (CRESTOR) 20 MG tablet Take 1 tablet (20 mg total) by mouth daily.   No current facility-administered medications on file prior to visit.     Allergies:   Patient has no known allergies.   Social History   Tobacco Use   Smoking status: Never Smoker   Smokeless tobacco: Never Used  Vaping Use   Vaping Use: Never used  Substance Use Topics   Alcohol use: Yes    Alcohol/week: 0.0 standard drinks    Comment: rare   Drug use: No    Family History: family history includes Heart disease in his mother; Prostate cancer in his father. There is no history of Colon cancer. mother had CHF, passed away age 81. Father died suddenly,  presumed heart attack, at age 40. Mat Gma had cancer, may have had heart issues as well. Mat Gpa died in his 1s, unknown cause. [name] died in his 85s, unknown cause. [name] also died of unknown causes.  ROS:   Please see the history of present illness.  Additional pertinent ROS otherwise unremarkable.  EKGs/Labs/Other Studies Reviewed:    The following studies were reviewed today: CT cardiac 01/16/20 RCA is a large dominant artery that gives rise to PDA and PLA. Calcified plaque in noted throughout the RCA causing moderate stenosis in the proximal and mid segments (50-69%) and mild stenosis in the distal segment (25-49%)   Left main is a large artery that gives rise to LAD and LCX arteries. The is mild calcified plaque in the distal LM causing minimal stenosis (0-24%)   LAD is a large vessel that has calcified plaque in the proximal to mid segments causing moderate stenosis (50-69%).   LCX is a non-dominant artery that gives rise to one large OM1 branch. There is calcified plaque in the proximal first obtuse marginal branch causing mild stenosis (25-49%).   Other findings:   Normal pulmonary vein drainage into the left atrium.   Normal left atrial appendage without a thrombus.   Normal size of the pulmonary artery.   IMPRESSION: 1. High coronary calcium score of 2616. This was 94th percentile for age and sex matched control ([place]).   2. Normal coronary origin with right dominance.   3. Heavy calcified plaque causing moderate stenosis (50-69%) in the proximal to mid segments of the LAD and RCA   4. CAD-RADS 3. Moderate stenosis. Consider symptom-guided anti-ischemic pharmacotherapy as well as risk factor modification per guideline directed care.   Additional analysis with CT FFR will be submitted and reported separately.   5.  Asa, statin recommended if no contraindications.  FFR 01/16/20 1. Left Main:  No significant stenosis.   2. LAD: No significant focal  stenosis. FFR 0.93 proximally, FFR 0.84 in the mid vessel, and slowly tapers to 0.75 distally with no focal stenosis 3. LCX: No significant stenosis. FFR 0.9 proximally, FFR 0.89 distally 4. RCA: No significant stenosis.  FFR 0.92   IMPRESSION: 1.  CT FFR analysis didn't show any significant focal stenosis.  EKG:  EKG is personally reviewed.  No ECG ordered today as it was phone visit  Recent Labs: 03/06/2020: ALT 12 04/14/2020: BUN 15; Creatinine, Ser 1.21; Hemoglobin 13.7; Platelets 248; Potassium 4.1; Sodium 136  Recent Lipid Panel    Component Value Date/Time   CHOL 112 03/06/2020 0941   TRIG 74 03/06/2020 0941   HDL 50 03/06/2020 0941   CHOLHDL 2.2 03/06/2020 0941   CHOLHDL 4 02/15/2019 0853   VLDL 17.6 02/15/2019 0853  LDLCALC 47 03/06/2020 0941    Physical Exam:    VS:  BP 139/75   Pulse 67   Ht 5' 11"  (1.803 m)   Wt 199 lb (90.3 kg)   BMI 27.75 kg/m     Wt Readings from Last 3 Encounters:  04/17/20 199 lb (90.3 kg)  04/14/20 214 lb 1.1 oz (97.1 kg)  04/07/20 214 lb (97.1 kg)    Speaking comfortably on the phone, no audible wheezing In no acute distress Alert and oriented Normal affect Normal speech  ASSESSMENT:    1. Pure hypercholesterolemia   2. Coronary artery disease due to calcified coronary lesion   3. Precordial pain   4. Essential hypertension    PLAN:    Precordial pain CAD on CT cardiac Vascular calcification Family history of heart disease -we again reviewed his CT results. This shows elevated calcium score and moderate CAD, but FFR was negative for flow limiting obstruction -he is now on aspirin 81 mg daily and rosuvastatin 20 mg daily -we started imdur, but this has not improved his symptoms, and it has affected his ability to use PDE5i, which has impacted his ability to manage his erectile dysfunction. Decrease dose today. -has PRN SL NG, understands how to use and risk with PDE5i -counseled to try nitro with next chest pain. If  does not improve, then try tylenol -counseled on red flag warning signs that need immediate medical attention -his chest pain symptoms are somewhat atypical given timing and quality, but they are affecting his life. Though CT cardiac and FFR did not suggest flow limiting disease, if no other clear etiology can be found, would consider cardiac catheterization for gold standard evaluation, especially given high amount of calcium on CT.  -reviewed recent ER visit  Hypercholesterolemia -continue rosuvastatin 20 mg daily given CAD, vascular calcification -recheck lipids 03/06/20 show LDL has gone from 128 to 47, at goal of <70  Hypertension -started on amlodipine after ER visit, tolerating  Cardiac risk counseling and prevention recommendations: -recommend heart healthy/Mediterranean diet, with whole grains, fruits, vegetable, fish, lean meats, nuts, and olive oil. Limit salt. -recommend moderate walking, 3-5 times/week for 30-50 minutes each session. Aim for at least 150 minutes.week. Goal should be pace of 3 miles/hours, or walking 1.5 miles in 30 minutes -recommend avoidance of tobacco products. Avoid excess alcohol.  Plan for follow up: 1 month, in person  Today, I have spent 20 minutes with the patient with telehealth technology discussing the above problems.  Additional time spent in chart review, documentation, and communication.  Buford Dresser, MD, PhD [place]  [facility]    Medication Adjustments/Labs and Tests Ordered: Current medicines are reviewed at length with the patient today.  Concerns regarding medicines are outlined above.  No orders of the defined types were placed in this encounter.  Meds ordered this encounter  Medications   DISCONTD: isosorbide mononitrate (IMDUR) 60 MG 24 hr tablet    Sig: Take 0.5 tablets (30 mg total) by mouth daily.    Dispense:  90 tablet    Refill:  3    Patient Instructions  Medication Instructions:  Decrease Imdur to 30  mg daily  *If you need a refill on your cardiac medications before your next appointment, please call your pharmacy*   Lab Work: None   Testing/Procedures: None   Follow-Up: At [facility], you and your health needs are our priority.  As part of our continuing mission to provide you with exceptional heart care, we  have created designated Provider Care Teams.  These Care Teams include your primary Cardiologist (physician) and Advanced Practice Providers (APPs -  Physician Assistants and Nurse Practitioners) who all work together to provide you with the care you need, when you need it.  We recommend signing up for the patient portal called "MyChart".  Sign up information is provided on this After Visit Summary.  MyChart is used to connect with patients for Virtual Visits (Telemedicine).  Patients are able to view lab/test results, encounter notes, upcoming appointments, etc.  Non-urgent messages can be sent to your provider as well.   To learn more about what you can do with MyChart, go to [email or web address].    Your next appointment:   May 20, 2020 @ 9:20 am   The format for your next appointment:   In Person  Provider:   Buford Dresser, MD     Signed, Buford Dresser, MD PhD 04/17/2020     [place]

## 2020-04-19 ENCOUNTER — Encounter: Payer: Self-pay | Admitting: Cardiology

## 2020-04-21 ENCOUNTER — Other Ambulatory Visit: Payer: Self-pay

## 2020-04-21 ENCOUNTER — Encounter: Payer: Self-pay | Admitting: Family Medicine

## 2020-04-21 ENCOUNTER — Ambulatory Visit (INDEPENDENT_AMBULATORY_CARE_PROVIDER_SITE_OTHER): Payer: PPO | Admitting: Family Medicine

## 2020-04-21 DIAGNOSIS — R0789 Other chest pain: Secondary | ICD-10-CM | POA: Diagnosis not present

## 2020-04-21 MED ORDER — OMEPRAZOLE 20 MG PO CPDR
40.0000 mg | DELAYED_RELEASE_CAPSULE | Freq: Every day | ORAL | Status: DC
Start: 2020-04-21 — End: 2020-05-13

## 2020-04-21 MED ORDER — HYDROXYZINE HCL 10 MG PO TABS: 10.0000 mg | ORAL_TABLET | Freq: Three times a day (TID) | ORAL | 1 refills | Status: DC | PRN

## 2020-04-21 NOTE — Patient Instructions (Addendum)
Increase the prilosec to 2 tabs a day.   Let me check with cardiology.  If needed, use hydroxyzine.  See if that helps.  Take care.  Glad to see you.

## 2020-04-21 NOTE — Progress Notes (Signed)
This visit occurred during the SARS-CoV-2 public health emergency.  Safety protocols were in place, including screening questions prior to the visit, additional usage of staff PPE, and extensive cleaning of exam room while observing appropriate contact time as indicated for disinfecting solutions.  Chest pain follow up.  Occ pressure vs sharp pain chest, usually R sided. No change in frequency or severity with dec in imdur.  No change with PPI dose change (but indigestion did improved on BID PPI).  Happens more at rest.  May or may not happen with activity, may be better with activity.  One time NTG helped but sometimes it doesn't help.  He can lift weights w/o trouble.    We talked about cardiac cath possibility but this hasn't been needed to this point.    He is sleeping better in the meantime.    Already on statin and meds for known nonobstructive CAD.  Prev troponin neg.    He can get a little lightheaded after getting out of bed, after laying down working on his car.    He does get anxious with the events. D/w pt.  He admits to being anxious and worrying.  He admits to being a perfectionist.  He is considering surgery for his foot and I will ask for cardiac input on that, for preop considerations.  Meds, vitals, and allergies reviewed.   ROS: Per HPI unless specifically indicated in ROS section   GEN: nad, alert and oriented HEENT: ncat NECK: supple w/o LA CV: rrr PULM: ctab, no inc wob ABD: soft, +bs EXT: no edema SKIN: no acute rash

## 2020-04-22 DIAGNOSIS — R0789 Other chest pain: Secondary | ICD-10-CM | POA: Insufficient documentation

## 2020-04-22 NOTE — Assessment & Plan Note (Addendum)
We talked about a broad differential at this point he did not appear to have flow restricting coronary disease that would contribute to his symptoms.  His symptoms are are not clearly worse with exercise.  He can lift weights without having chest pain.  Previous cardiology evaluation discussed with patient.  He is occasionally getting lightheaded. I will ask cardiology about stopping Imdur.  I did not want to stop this without cardiology input.    In the meantime will Inc PPI back to 2 tabs a day in the AM.  That is easier for patient then BID dosing.    Also need cardiology input to make sure it would be okay for the patient to proceed with foot surgery.    I thought it made sense to change 1 medication at a time so he will increase the PPI for now and we will see about his Imdur dosing.  In the meantime he can also use hydroxyzine if he gets anxious.  He will let me know if that helps.  I appreciate the help of all involved, especially cardiology.  At least 30 minutes were devoted to patient care in this encounter (this can include time spent reviewing the patient's file/history, interviewing and examining the patient, counseling/reviewing plan with patient, ordering referrals, ordering tests, reviewing relevant laboratory or x-ray data, and documenting the encounter).

## 2020-04-24 DIAGNOSIS — Z20828 Contact with and (suspected) exposure to other viral communicable diseases: Secondary | ICD-10-CM | POA: Diagnosis not present

## 2020-04-26 ENCOUNTER — Telehealth: Payer: Self-pay | Admitting: Family Medicine

## 2020-04-26 NOTE — Telephone Encounter (Signed)
Notify patient.  Should be okay to stop isosorbide/Imdur.  Please let me know how he does with the higher dose of Prilosec, with stopping Imdur, and with using hydroxyzine as needed.  Thanks.  -----------------------------------------   Buford Dresser, MD  Tonia Ghent, MD Yep, he is ok for surgery. We talked a long time after his ER visit. His pain had been atypical and CT was nonobstructive, but the fact that his troponins were fine is actually the clincher. I don't think his chest pain is his heart. We are actually scaling back a bit on medications (they are very interested in being able to use PDE5i again). So he does not have limitations from a cardiac standpoint. I always talk about red flags with them, but I don't think the routine pain he is having is cardiac.  Thanks!  Trevor Ross

## 2020-04-27 ENCOUNTER — Telehealth: Payer: Self-pay

## 2020-04-27 NOTE — Telephone Encounter (Signed)
Recommendations from Dr. Damita Dunnings regarding pt's medications has been sent thru Mychart

## 2020-05-07 ENCOUNTER — Other Ambulatory Visit: Payer: Self-pay | Admitting: Family Medicine

## 2020-05-12 ENCOUNTER — Telehealth (INDEPENDENT_AMBULATORY_CARE_PROVIDER_SITE_OTHER): Payer: PPO | Admitting: Cardiology

## 2020-05-12 ENCOUNTER — Other Ambulatory Visit: Payer: Self-pay

## 2020-05-12 ENCOUNTER — Other Ambulatory Visit: Payer: Self-pay | Admitting: Family Medicine

## 2020-05-12 VITALS — BP 140/75 | HR 56 | Ht 71.0 in | Wt 199.0 lb

## 2020-05-12 DIAGNOSIS — E7800 Pure hypercholesterolemia, unspecified: Secondary | ICD-10-CM

## 2020-05-12 DIAGNOSIS — I2510 Atherosclerotic heart disease of native coronary artery without angina pectoris: Secondary | ICD-10-CM | POA: Diagnosis not present

## 2020-05-12 DIAGNOSIS — R072 Precordial pain: Secondary | ICD-10-CM | POA: Diagnosis not present

## 2020-05-12 DIAGNOSIS — I2584 Coronary atherosclerosis due to calcified coronary lesion: Secondary | ICD-10-CM | POA: Diagnosis not present

## 2020-05-12 DIAGNOSIS — Z8249 Family history of ischemic heart disease and other diseases of the circulatory system: Secondary | ICD-10-CM

## 2020-05-12 DIAGNOSIS — Z7189 Other specified counseling: Secondary | ICD-10-CM | POA: Diagnosis not present

## 2020-05-12 NOTE — Patient Instructions (Signed)
Medication Instructions:  Stop: Imdur 30 mg daily  *If you need a refill on your cardiac medications before your next appointment, please call your pharmacy*   Lab Work: None   Testing/Procedures: None   Follow-Up: At [facility], you and your health needs are our priority.  As part of our continuing mission to provide you with exceptional heart care, we have created designated Provider Care Teams.  These Care Teams include your primary Cardiologist (physician) and Advanced Practice Providers (APPs -  Physician Assistants and Nurse Practitioners) who all work together to provide you with the care you need, when you need it.  We recommend signing up for the patient portal called "MyChart".  Sign up information is provided on this After Visit Summary.  MyChart is used to connect with patients for Virtual Visits (Telemedicine).  Patients are able to view lab/test results, encounter notes, upcoming appointments, etc.  Non-urgent messages can be sent to your provider as well.   To learn more about what you can do with MyChart, go to [email or web address].    Your next appointment:   3 month(s)  The format for your next appointment:   In Person  Provider:   Jodelle Red, MD

## 2020-05-13 ENCOUNTER — Other Ambulatory Visit: Payer: Self-pay | Admitting: Family Medicine

## 2020-05-14 ENCOUNTER — Encounter: Payer: Self-pay | Admitting: Family Medicine

## 2020-05-15 NOTE — Telephone Encounter (Signed)
Received voice mail to follow up on message. Called patient l/m to call office to let them know provider is out of the office today.

## 2020-05-15 NOTE — Telephone Encounter (Signed)
Please Advise on this 

## 2020-05-18 ENCOUNTER — Encounter: Payer: Self-pay | Admitting: Cardiology

## 2020-05-18 MED ORDER — AMLODIPINE BESYLATE 2.5 MG PO TABS
2.5000 mg | ORAL_TABLET | Freq: Every day | ORAL | 1 refills | Status: DC
Start: 2020-05-18 — End: 2020-11-27

## 2020-05-18 MED ORDER — OMEPRAZOLE 20 MG PO CPDR
40.0000 mg | DELAYED_RELEASE_CAPSULE | Freq: Every day | ORAL | 1 refills | Status: DC
Start: 2020-05-18 — End: 2020-10-19

## 2020-05-18 NOTE — Telephone Encounter (Signed)
Sent. Thanks.   

## 2020-05-18 NOTE — Progress Notes (Signed)
Virtual Visit via Telephone Note   This visit type was conducted due to national recommendations for restrictions regarding the COVID-19 Pandemic (e.g. social distancing) in an effort to limit this patient's exposure and mitigate transmission in our community.  Due to his co-morbid illnesses, this patient is at least at moderate risk for complications without adequate follow up.  This format is felt to be most appropriate for this patient at this time.  The patient did not have access to video technology/had technical difficulties with video requiring transitioning to audio format only (telephone).  All issues noted in this document were discussed and addressed.  No physical exam could be performed with this format.  Please refer to the patient's chart for his  consent to telehealth for [facility].   The patient was identified using 2 identifiers.  Patient Location: Home Provider Location: Home Office  Date:  05/18/2020   ID:  Trevor Ross, Trevor Ross Sep 13, 1945, MRN [id number]  PCP:  Tonia Ghent, MD  Cardiologist:  Buford Dresser, MD  Referring MD: Tonia Ghent, MD   CC: follow up  History of Present Illness:    DERYK BOZMAN is a 75 y.o. male with a hx of arthritis, vascular calcification who is seen for follow up today. I initially met him 01/08/20 as a new consult at the request of Tonia Ghent, MD for the evaluation and management of possible coronary artery disease and chest pain.  Today: Has not had any chest pain in the last 2-3 days. Only recent change is that he is taking 2 pills of a PPI in the morning. He has not had any dizziness. We reviewed recent Bps. From 12/19-12/24 he had several days of borderline low blood pressures in the 100-110/60s range. Since then, range has been 106/67-141/65. Initial today 140/74, recheck 138/74.  We discussed options, workup to date. He is still taking imdur. We discussed continuing this vs trial of stopping. As he would  like to get back on his PDE5i, he would like to trial this.   Denies chest pain, shortness of breath at rest or with normal exertion. No PND, orthopnea, LE edema or unexpected weight gain. No syncope or palpitations.  Past Medical History:  Diagnosis Date  . Allergy   . Arthritis    hands  . Diverticulosis of colon 09/15/2006  . Dysphagia   . Hiatal hernia   . Nasal polyps   . Nephrolithiasis    PSA per Dr. Serita Butcher  . OSA (obstructive sleep apnea)     Past Surgical History:  Procedure Laterality Date  . APPENDECTOMY  1988  . CARPAL TUNNEL RELEASE Left   . CATARACT EXTRACTION Bilateral   . Left knee repair  1970's  . LITHOTRIPSY     twice per pt  . right knee surgery     arthroscopic  . Sinus surgery for polyps      Current Medications: Current Outpatient Medications on File Prior to Visit  Medication Sig  . aspirin EC 81 MG tablet Take 1 tablet (81 mg total) by mouth daily. Swallow whole.  . hydrOXYzine (ATARAX/VISTARIL) 10 MG tablet Take 1 tablet (10 mg total) by mouth 3 (three) times daily as needed for anxiety.  . multivitamin-lutein (OCUVITE-LUTEIN) CAPS capsule Take 1 capsule by mouth daily.  . nitroGLYCERIN (NITROSTAT) 0.4 MG SL tablet Place 1 tablet (0.4 mg total) under the tongue every 5 (five) minutes as needed for chest pain (max 3 doses).  Marland Kitchen omeprazole (PRILOSEC) 20  MG capsule Take 2 capsules (40 mg total) by mouth daily.  Marland Kitchen Phenylephrine HCl (SINEX REGULAR NA) Place into the nose. Use daily as needed  . rosuvastatin (CRESTOR) 20 MG tablet Take 1 tablet (20 mg total) by mouth daily.   No current facility-administered medications on file prior to visit.     Allergies:   Patient has no known allergies.   Social History   Tobacco Use  . Smoking status: Never Smoker  . Smokeless tobacco: Never Used  Vaping Use  . Vaping Use: Never used  Substance Use Topics  . Alcohol use: Yes    Alcohol/week: 0.0 standard drinks    Comment: rare  . Drug use: No     Family History: family history includes Heart disease in his mother; Prostate cancer in his father. There is no history of Colon cancer. mother had CHF, passed away age 19. Father died suddenly, presumed heart attack, at age 56. Mat Gma had cancer, may have had heart issues as well. Mat Gpa died in his 69s, unknown cause. [name] died in his 3s, unknown cause. [name] also died of unknown causes.  ROS:   Please see the history of present illness.  Additional pertinent ROS otherwise unremarkable.  EKGs/Labs/Other Studies Reviewed:    The following studies were reviewed today: CT cardiac 01/16/20 RCA is a large dominant artery that gives rise to PDA and PLA. Calcified plaque in noted throughout the RCA causing moderate stenosis in the proximal and mid segments (50-69%) and mild stenosis in the distal segment (25-49%)  Left main is a large artery that gives rise to LAD and LCX arteries. The is mild calcified plaque in the distal LM causing minimal stenosis (0-24%)  LAD is a large vessel that has calcified plaque in the proximal to mid segments causing moderate stenosis (50-69%).  LCX is a non-dominant artery that gives rise to one large OM1 branch. There is calcified plaque in the proximal first obtuse marginal branch causing mild stenosis (25-49%).  Other findings:  Normal pulmonary vein drainage into the left atrium.  Normal left atrial appendage without a thrombus.  Normal size of the pulmonary artery.  IMPRESSION: 1. High coronary calcium score of 2616. This was 94th percentile for age and sex matched control (Terry).  2. Normal coronary origin with right dominance.  3. Heavy calcified plaque causing moderate stenosis (50-69%) in the proximal to mid segments of the LAD and RCA  4. CAD-RADS 3. Moderate stenosis. Consider symptom-guided anti-ischemic pharmacotherapy as well as risk factor modification per guideline directed care.  Additional analysis  with CT FFR will be submitted and reported separately.  5.  Asa, statin recommended if no contraindications.  FFR 01/16/20 1. Left Main:  No significant stenosis.  2. LAD: No significant focal stenosis. FFR 0.93 proximally, FFR 0.84 in the mid vessel, and slowly tapers to 0.75 distally with no focal stenosis 3. LCX: No significant stenosis. FFR 0.9 proximally, FFR 0.89 distally 4. RCA: No significant stenosis.  FFR 0.92  IMPRESSION: 1.  CT FFR analysis didn't show any significant focal stenosis.  EKG:  EKG is personally reviewed.  The ekg ordered 04/14/20 demonstrates sinus rhythm  Recent Labs: 03/06/2020: ALT 12 04/14/2020: BUN 15; Creatinine, Ser 1.21; Hemoglobin 13.7; Platelets 248; Potassium 4.1; Sodium 136  Recent Lipid Panel    Component Value Date/Time   CHOL 112 03/06/2020 0941   TRIG 74 03/06/2020 0941   HDL 50 03/06/2020 0941   CHOLHDL 2.2 03/06/2020 0941  CHOLHDL 4 02/15/2019 0853   VLDL 17.6 02/15/2019 0853   LDLCALC 47 03/06/2020 0941    Physical Exam:    VS:  BP 140/75   Pulse (!) 56   Ht _0  (1.803 m)   Wt 199 lb (90.3 kg)   BMI 27.75 kg/m     Wt Readings from Last 3 Encounters:  05/12/20 199 lb (90.3 kg)  04/21/20 207 lb 9.6 oz (94.2 kg)  04/17/20 199 lb (90.3 kg)    Speaking comfortably on the phone, no audible wheezing In no acute distress Alert and oriented Normal affect Normal speech  ASSESSMENT:    1. Coronary artery disease due to calcified coronary lesion   2. Precordial pain   3. Pure hypercholesterolemia   4. FH: CAD (coronary artery disease)   5. Cardiac risk counseling   6. Counseling on health promotion and disease prevention    PLAN:    Precordial pain CAD on CT cardiac Vascular calcification Family history of heart disease -we again reviewed his CT results. This shows elevated calcium score and moderate CAD, but FFR was negative for flow limiting obstruction -he is now on aspirin 81 mg daily and rosuvastatin 20 mg  daily -we started imdur, but this has not improved his symptoms, and it has affected his ability to use PDE5i, which has impacted his ability to manage his erectile dysfunction -has PRN SL NG, understands how to use and risk with PDE5i -given that he had ER visit he had negative troponins with his chest pain, suspect it is noncardiac in etiology -no chest pain since starting PPI -we will trial off imdur. This may also be helping his GI pain, so counseled that he may have recurrence in symptoms -reviewed that he should call me with pain, rising BP -counseled on red flag warning signs that need immediate medical attention  Labile blood pressure: -has been variable. Monitor off imdur. Will call if BP rises  Hypercholesterolemia -continue rosuvastatin 20 mg daily given CAD, vascular calcification -recheck lipids 03/06/20 show LDL has gone from 128 to 47, at goal of <70  Cardiac risk counseling and prevention recommendations: -recommend heart healthy/Mediterranean diet, with whole grains, fruits, vegetable, fish, lean meats, nuts, and olive oil. Limit salt. -recommend moderate walking, 3-5 times/week for 30-50 minutes each session. Aim for at least 150 minutes.week. Goal should be pace of 3 miles/hours, or walking 1.5 miles in 30 minutes -recommend avoidance of tobacco products. Avoid excess alcohol. -Additional risk factor control:  -diabetes risk: A1c 5.6  -Weight: BMI 27  Plan for follow up: 3 mos to monitor symptoms  Today, I have spent 11 minutes with the patient with telehealth technology discussing the above problems.  Additional time spent in chart review, documentation, and communication.  Buford Dresser, MD, PhD, [facility]    Medication Adjustments/Labs and Tests Ordered: Current medicines are reviewed at length with the patient today.  Concerns regarding medicines are outlined above.  No orders of the defined types were placed in this  encounter.  No orders of the defined types were placed in this encounter.   Patient Instructions  Medication Instructions:  Stop: Imdur 30 mg daily  *If you need a refill on your cardiac medications before your next appointment, please call your pharmacy*   Lab Work: None   Testing/Procedures: None   Follow-Up: At [facility], you and your health needs are our priority.  As part of our continuing mission to provide you with exceptional heart  care, we have created designated Provider Care Teams.  These Care Teams include your primary Cardiologist (physician) and Advanced Practice Providers (APPs -  Physician Assistants and Nurse Practitioners) who all work together to provide you with the care you need, when you need it.  We recommend signing up for the patient portal called "MyChart".  Sign up information is provided on this After Visit Summary.  MyChart is used to connect with patients for Virtual Visits (Telemedicine).  Patients are able to view lab/test results, encounter notes, upcoming appointments, etc.  Non-urgent messages can be sent to your provider as well.   To learn more about what you can do with MyChart, go to [email or web address].    Your next appointment:   3 month(s)  The format for your next appointment:   In Person  Provider:   Buford Dresser, MD      Signed, Buford Dresser, MD PhD 05/18/2020     [place]

## 2020-05-20 ENCOUNTER — Telehealth: Payer: PPO | Admitting: Cardiology

## 2020-05-21 ENCOUNTER — Encounter: Payer: Self-pay | Admitting: Cardiology

## 2020-07-03 ENCOUNTER — Telehealth (INDEPENDENT_AMBULATORY_CARE_PROVIDER_SITE_OTHER): Payer: PPO | Admitting: Primary Care

## 2020-07-03 ENCOUNTER — Telehealth: Payer: Self-pay

## 2020-07-03 VITALS — BP 136/69 | HR 58 | Temp 99.1°F | Wt 198.0 lb

## 2020-07-03 DIAGNOSIS — R0981 Nasal congestion: Secondary | ICD-10-CM | POA: Diagnosis not present

## 2020-07-03 MED ORDER — FLUTICASONE PROPIONATE 50 MCG/ACT NA SUSP: 1.0000 | Freq: Two times a day (BID) | NASAL | 0 refills | Status: DC

## 2020-07-03 NOTE — Telephone Encounter (Signed)
Noted, patient evaluated.  ?

## 2020-07-03 NOTE — Progress Notes (Signed)
Subjective:    Patient ID: Trevor Ross, male    DOB: 06-18-1945, 75 y.o.   MRN: [id number]  HPI  Virtual Visit via Video Note  I connected with Trevor Ross on 07/03/20 at 11:40 AM EST by a video enabled telemedicine application and verified that I am speaking with the correct person using two identifiers.  We attempted to connect via video, however he could not get his camera to enable.  We conducted our visit via phone which last 10 minutes and 13 seconds.  Location: Patient: Home Provider: Office Participants: Patient and myself   I discussed the limitations of evaluation and management by telemedicine and the availability of in person appointments. The patient expressed understanding and agreed to proceed.  History of Present Illness:  Trevor Ross is a 75 year old male patient of Dr. Damita Dunnings with a history of OSA, hypoglycemia, atypical chest pain, GERD who presents today with a chief complaint of head congestion.  He also reports sinus pressure, low grade fever, sleep disturbance. Symptoms began 4-5 days ago, increased symptoms yesterday. His wife tested positive for Covid-19 4-5 days ago. He tested and was negative yesterday and today. He's completed two Covid-19 vaccines.  Overall he is feeling about the same, but notes that his symptoms are mild.  His wife's symptoms have fully resolved, hers began a few days before his.  He's tried taking Tylenol, Mucinex DM.  He is also using oxymetazoline for which he uses nightly and has done so for years.   Observations/Objective:  Alert and oriented. Appears well, not sickly. No distress. Speaking in complete sentences. No cough during visit.  Assessment and Plan:  Symptoms suspicious for COVID-19 infection, or other viral illness. Suspect he has tested negative as viral load may not be as high given his vaccination status, could have also tested too soon.  He will retest in 2 days.  In the meantime I advised that he stop  his oxymetazoline, discussed the dependent nature of this medication and that he should refrain from use for more than 5 days in a row.  Prescription for Flonase sent to pharmacy. He will call to update in a few days if no improvement.  He appears stable for outpatient treatment, he is in no distress.  Follow Up Instructions:  Nasal Congestion/Ear Pressure/Sinus Pressure: Try using Flonase (fluticasone) nasal spray. Instill 1 spray in each nostril twice daily.   I recommend you discontinue the Sinex (Oxymetazoline) nasal spray as this can cause dependence and ineffectiveness.  When you stop the medication your symptoms will be worse, but will improve within a week or so.  Continue Tylenol as needed.  Please contact us if your symptoms do not improve.  It was a pleasure meeting you! Allie Bossier, NP-C    I discussed the assessment and treatment plan with the patient. The patient was provided an opportunity to ask questions and all were answered. The patient agreed with the plan and demonstrated an understanding of the instructions.   The patient was advised to call back or seek an in-person evaluation if the symptoms worsen or if the condition fails to improve as anticipated.   Trevor Koch, NP    Review of Systems  Constitutional: Positive for fever. Negative for chills.  HENT: Positive for congestion and sinus pressure.   Respiratory: Negative for shortness of breath.   Cardiovascular: Positive for chest pain.  Neurological: Positive for headaches.       Past Medical History:  Diagnosis Date  .  Allergy   . Arthritis    hands  . Diverticulosis of colon 09/15/2006  . Dysphagia   . Hiatal hernia   . Nasal polyps   . Nephrolithiasis    PSA per Dr. Serita Butcher  . OSA (obstructive sleep apnea)      Social History   Socioeconomic History  . Marital status: Married    Spouse name: Not on file  . Number of children: 0  . Years of education: Not on file  . Highest  education level: Not on file  Occupational History  . Occupation: Insurance account manager  Tobacco Use  . Smoking status: Never Smoker  . Smokeless tobacco: Never Used  Vaping Use  . Vaping Use: Never used  Substance and Sexual Activity  . Alcohol use: Yes    Alcohol/week: 0.0 standard drinks    Comment: rare  . Drug use: No  . Sexual activity: Not on file  Other Topics Concern  . Not on file  Social History Narrative   1 step-daughter, 1 grandson   Married 1980   Social Determinants of Radio broadcast assistant Strain: Not on file  Food Insecurity: Not on file  Transportation Needs: Not on file  Physical Activity: Not on file  Stress: Not on file  Social Connections: Not on file  Intimate Partner Violence: Not on file    Past Surgical History:  Procedure Laterality Date  . APPENDECTOMY  1988  . CARPAL TUNNEL RELEASE Left   . CATARACT EXTRACTION Bilateral   . Left knee repair  1970's  . LITHOTRIPSY     twice per pt  . right knee surgery     arthroscopic  . Sinus surgery for polyps      Family History  Problem Relation Age of Onset  . Heart disease Mother   . Prostate cancer Father   . Colon cancer Neg Hx     No Known Allergies  Current Outpatient Medications on File Prior to Visit  Medication Sig Dispense Refill  . amLODipine (NORVASC) 2.5 MG tablet Take 1 tablet (2.5 mg total) by mouth daily. 90 tablet 1  . aspirin EC 81 MG tablet Take 1 tablet (81 mg total) by mouth daily. Swallow whole. 90 tablet 3  . hydrOXYzine (ATARAX/VISTARIL) 10 MG tablet Take 1 tablet (10 mg total) by mouth 3 (three) times daily as needed for anxiety. 30 tablet 1  . multivitamin-lutein (OCUVITE-LUTEIN) CAPS capsule Take 1 capsule by mouth daily.    . nitroGLYCERIN (NITROSTAT) 0.4 MG SL tablet Place 1 tablet (0.4 mg total) under the tongue every 5 (five) minutes as needed for chest pain (max 3 doses). 25 tablet 3  . omeprazole (PRILOSEC) 20 MG capsule Take 2 capsules (40 mg total) by mouth  daily. 180 capsule 1  . Phenylephrine HCl (SINEX REGULAR NA) Place into the nose. Use daily as needed    . rosuvastatin (CRESTOR) 20 MG tablet Take 1 tablet (20 mg total) by mouth daily. 90 tablet 3   No current facility-administered medications on file prior to visit.    BP 136/69   Pulse (!) 58   Temp 99.1 F (37.3 C) (Oral)   Wt 198 lb (89.8 kg)   SpO2 98%   BMI 27.62 kg/m    Objective:   Physical Exam Constitutional:      General: He is not in acute distress.    Appearance: He is not ill-appearing.  Pulmonary:     Effort: Pulmonary effort is normal.  Comments: No cough during visit Neurological:     Mental Status: He is alert and oriented to person, place, and time.            Assessment & Plan:

## 2020-07-03 NOTE — Telephone Encounter (Signed)
pts wife said she tested positive for covid on 06/28/20. For last 2 days pt has H/A on and off and pts head feels full; runny nose and head and sinus congestion. No other covid symptoms; no fever, no cough or SOB but pt is taking tylenol and mucinex DM. Pt has video visit with Gentry Fitz NP 07/03/20 at 11:40. Pt will have vitals ready when CMA calls; sometimes has problems with phone reception and if cannot get pt on cell please call house phone. Pt did test neg with home covid test on 07/02/20.

## 2020-07-03 NOTE — Patient Instructions (Signed)
Nasal Congestion/Ear Pressure/Sinus Pressure: Try using Flonase (fluticasone) nasal spray. Instill 1 spray in each nostril twice daily.   I recommend you discontinue the Sinex (Oxymetazoline) nasal spray as this can cause dependence and ineffectiveness.  When you stop the medication your symptoms will be worse, but will improve within a week or so.  Continue Tylenol as needed.  Please contact us if your symptoms do not improve.  It was a pleasure meeting you! Allie Bossier, NP-C

## 2020-07-15 ENCOUNTER — Encounter: Payer: Self-pay | Admitting: Cardiology

## 2020-07-15 ENCOUNTER — Telehealth (INDEPENDENT_AMBULATORY_CARE_PROVIDER_SITE_OTHER): Payer: PPO | Admitting: Cardiology

## 2020-07-15 VITALS — BP 107/64 | HR 53 | Ht 71.0 in | Wt 196.0 lb

## 2020-07-15 DIAGNOSIS — I2510 Atherosclerotic heart disease of native coronary artery without angina pectoris: Secondary | ICD-10-CM

## 2020-07-15 DIAGNOSIS — U071 COVID-19: Secondary | ICD-10-CM | POA: Diagnosis not present

## 2020-07-15 DIAGNOSIS — E7800 Pure hypercholesterolemia, unspecified: Secondary | ICD-10-CM | POA: Diagnosis not present

## 2020-07-15 DIAGNOSIS — I2584 Coronary atherosclerosis due to calcified coronary lesion: Secondary | ICD-10-CM | POA: Diagnosis not present

## 2020-07-15 DIAGNOSIS — Z7189 Other specified counseling: Secondary | ICD-10-CM | POA: Diagnosis not present

## 2020-07-15 DIAGNOSIS — Z8249 Family history of ischemic heart disease and other diseases of the circulatory system: Secondary | ICD-10-CM | POA: Diagnosis not present

## 2020-07-15 NOTE — Progress Notes (Signed)
Virtual Visit via Telephone Note   This visit type was conducted due to national recommendations for restrictions regarding the COVID-19 Pandemic (e.g. social distancing) in an effort to limit this patient's exposure and mitigate transmission in our community.  Due to his co-morbid illnesses, this patient is at least at moderate risk for complications without adequate follow up.  This format is felt to be most appropriate for this patient at this time.  The patient did not have access to video technology/had technical difficulties with video requiring transitioning to audio format only (telephone).  All issues noted in this document were discussed and addressed.  No physical exam could be performed with this format.  Please refer to the patient's chart for his  consent to telehealth for [facility].   The patient was identified using 2 identifiers.  Patient Location: Home Provider Location: Office/Clinic  Date:  07/15/2020   ID:  INDIANA PECHACEK, DOB [birth year], MRN [id number]  PCP:  Tonia Ghent, MD  Cardiologist:  Buford Dresser, MD  Referring MD: Tonia Ghent, MD   CC: follow up  History of Present Illness:    Trevor Ross is a 75 y.o. male with a hx of arthritis, vascular calcification who is seen for follow up today. I initially met him 01/08/20 as a new consult at the request of Tonia Ghent, MD for the evaluation and management of possible coronary artery disease and chest pain.  Today: covid positive, but overall doing well. Mild sinus issues.   No chest pain in the last week. Feels much better on PPI. No longer taking imdur, confirmed.   Discussed signs/symptoms to watch for.  We reviewed diet and exercise again today.  Denies chest pain, shortness of breath at rest or with normal exertion. No PND, orthopnea, LE edema or unexpected weight gain. No syncope or palpitations.   Past Medical History:  Diagnosis Date   Allergy    Arthritis    hands    Diverticulosis of colon 09/15/2006   Dysphagia    Hiatal hernia    Nasal polyps    Nephrolithiasis    PSA per Dr. Serita Butcher   OSA (obstructive sleep apnea)     Past Surgical History:  Procedure Laterality Date   APPENDECTOMY  1988   CARPAL TUNNEL RELEASE Left    CATARACT EXTRACTION Bilateral    Left knee repair  1970's   LITHOTRIPSY     twice per pt   right knee surgery     arthroscopic   Sinus surgery for polyps      Current Medications: Current Outpatient Medications on File Prior to Visit  Medication Sig   amLODipine (NORVASC) 2.5 MG tablet Take 1 tablet (2.5 mg total) by mouth daily.   aspirin EC 81 MG tablet Take 1 tablet (81 mg total) by mouth daily. Swallow whole.   fluticasone (FLONASE) 50 MCG/ACT nasal spray Place 1 spray into both nostrils 2 (two) times daily.   hydrOXYzine (ATARAX/VISTARIL) 10 MG tablet Take 1 tablet (10 mg total) by mouth 3 (three) times daily as needed for anxiety.   multivitamin-lutein (OCUVITE-LUTEIN) CAPS capsule Take 1 capsule by mouth daily.   omeprazole (PRILOSEC) 20 MG capsule Take 2 capsules (40 mg total) by mouth daily.   Phenylephrine HCl (SINEX REGULAR NA) Place into the nose. Use daily as needed   nitroGLYCERIN (NITROSTAT) 0.4 MG SL tablet Place 1 tablet (0.4 mg total) under the tongue every 5 (five) minutes as needed for chest pain (  max 3 doses).   rosuvastatin (CRESTOR) 20 MG tablet Take 1 tablet (20 mg total) by mouth daily.   No current facility-administered medications on file prior to visit.     Allergies:   Patient has no known allergies.   Social History   Tobacco Use   Smoking status: Never Smoker   Smokeless tobacco: Never Used  Vaping Use   Vaping Use: Never used  Substance Use Topics   Alcohol use: Yes    Alcohol/week: 0.0 standard drinks    Comment: rare   Drug use: No    Family History: family history includes Heart disease in his mother; Prostate cancer in his father. There is no history of Colon cancer.  mother had CHF, passed away age 63. Father died suddenly, presumed heart attack, at age 43. Mat Gma had cancer, may have had heart issues as well. Mat Gpa died in his 38s, unknown cause. [name] died in his 71s, unknown cause. [name] also died of unknown causes.  ROS:   Please see the history of present illness.  Additional pertinent ROS otherwise unremarkable.  EKGs/Labs/Other Studies Reviewed:    The following studies were reviewed today: CT cardiac 01/16/20 RCA is a large dominant artery that gives rise to PDA and PLA. Calcified plaque in noted throughout the RCA causing moderate stenosis in the proximal and mid segments (50-69%) and mild stenosis in the distal segment (25-49%)   Left main is a large artery that gives rise to LAD and LCX arteries. The is mild calcified plaque in the distal LM causing minimal stenosis (0-24%)   LAD is a large vessel that has calcified plaque in the proximal to mid segments causing moderate stenosis (50-69%).   LCX is a non-dominant artery that gives rise to one large OM1 branch. There is calcified plaque in the proximal first obtuse marginal branch causing mild stenosis (25-49%).   Other findings:   Normal pulmonary vein drainage into the left atrium.   Normal left atrial appendage without a thrombus.   Normal size of the pulmonary artery.   IMPRESSION: 1. High coronary calcium score of 2616. This was 94th percentile for age and sex matched control ([place]).   2. Normal coronary origin with right dominance.   3. Heavy calcified plaque causing moderate stenosis (50-69%) in the proximal to mid segments of the LAD and RCA   4. CAD-RADS 3. Moderate stenosis. Consider symptom-guided anti-ischemic pharmacotherapy as well as risk factor modification per guideline directed care.   Additional analysis with CT FFR will be submitted and reported separately.   5.  Asa, statin recommended if no contraindications.  FFR 01/16/20 1. Left Main:   No significant stenosis.   2. LAD: No significant focal stenosis. FFR 0.93 proximally, FFR 0.84 in the mid vessel, and slowly tapers to 0.75 distally with no focal stenosis 3. LCX: No significant stenosis. FFR 0.9 proximally, FFR 0.89 distally 4. RCA: No significant stenosis.  FFR 0.92   IMPRESSION: 1.  CT FFR analysis didn't show any significant focal stenosis.  EKG:  EKG is personally reviewed.  The ekg ordered 04/14/20 demonstrates sinus rhythm  Recent Labs: 03/06/2020: ALT 12 04/14/2020: BUN 15; Creatinine, Ser 1.21; Hemoglobin 13.7; Platelets 248; Potassium 4.1; Sodium 136  Recent Lipid Panel    Component Value Date/Time   CHOL 112 03/06/2020 0941   TRIG 74 03/06/2020 0941   HDL 50 03/06/2020 0941   CHOLHDL 2.2 03/06/2020 0941   CHOLHDL 4 02/15/2019 0853   VLDL  17.6 02/15/2019 0853   LDLCALC 47 03/06/2020 0941    Physical Exam:    VS:  BP 107/64   Pulse (!) 53   Ht _0  (1.803 m)   Wt 196 lb (88.9 kg)   SpO2 97%   BMI 27.34 kg/m     Wt Readings from Last 3 Encounters:  07/15/20 196 lb (88.9 kg)  07/03/20 198 lb (89.8 kg)  05/12/20 199 lb (90.3 kg)    Speaking comfortably on the phone, no audible wheezing In no acute distress Alert and oriented Normal affect Normal speech  ASSESSMENT:    1. COVID-19   2. Coronary artery disease due to calcified coronary lesion   3. Pure hypercholesterolemia   4. FH: CAD (coronary artery disease)   5. Cardiac risk counseling   6. Counseling on health promotion and disease prevention    PLAN:    Covid infection: has not required hospitalization  Precordial pain, now improved CAD on CT cardiac Vascular calcification Family history of heart disease -CTshows elevated calcium score and moderate CAD, but FFR was negative for flow limiting obstruction -he is now on aspirin 81 mg daily and rosuvastatin 20 mg daily -has PRN SL NG, understands how to use and risk with PDE5i -given that he had ER visit he had negative  troponins with his chest pain, suspect it is noncardiac in etiology -no chest pain since starting PPI -we will trial off imdur. This may also be helping his GI pain, so counseled that he may have recurrence in symptoms -reviewed that he should call me with pain, rising BP -counseled on red flag warning signs that need immediate medical attention  Labile blood pressure: -has been variable. Will call if BP rises  Hypercholesterolemia -continue rosuvastatin 20 mg daily given CAD, vascular calcification -recheck lipids 03/06/20 show LDL has gone from 128 to 47, at goal of <70  Cardiac risk counseling and prevention recommendations: -recommend heart healthy/Mediterranean diet, with whole grains, fruits, vegetable, fish, lean meats, nuts, and olive oil. Limit salt. -recommend moderate walking, 3-5 times/week for 30-50 minutes each session. Aim for at least 150 minutes/week. Goal should be pace of 3 miles/hours, or walking 1.5 miles in 30 minutes -recommend avoidance of tobacco products. Avoid excess alcohol. -Additional risk factor control:  -diabetes risk: A1c 5.6  -Weight: BMI 27  Plan for follow up: 6 mos in person  Today, I have spent 13 minutes with the patient with telehealth technology discussing the above problems.  Additional time spent in chart review, documentation, and communication.  Buford Dresser, MD, PhD, [facility]    Medication Adjustments/Labs and Tests Ordered: Current medicines are reviewed at length with the patient today.  Concerns regarding medicines are outlined above.  No orders of the defined types were placed in this encounter.  No orders of the defined types were placed in this encounter.   Patient Instructions  Medication Instructions:  Your Physician recommend you continue on your current medication as directed.    *If you need a refill on your cardiac medications before your next appointment, please call your  pharmacy*   Lab Work: None   Testing/Procedures: None   Follow-Up: At [facility], you and your health needs are our priority.  As part of our continuing mission to provide you with exceptional heart care, we have created designated Provider Care Teams.  These Care Teams include your primary Cardiologist (physician) and Advanced Practice Providers (APPs -  Physician Assistants and Nurse Practitioners)  who all work together to provide you with the care you need, when you need it.  We recommend signing up for the patient portal called "MyChart".  Sign up information is provided on this After Visit Summary.  MyChart is used to connect with patients for Virtual Visits (Telemedicine).  Patients are able to view lab/test results, encounter notes, upcoming appointments, etc.  Non-urgent messages can be sent to your provider as well.   To learn more about what you can do with MyChart, go to [email or web address].    Your next appointment:   6 month(s)  The format for your next appointment:   In Person  Provider:   Buford Dresser, MD     Signed, Buford Dresser, MD PhD 07/15/2020     [place]

## 2020-07-15 NOTE — Patient Instructions (Signed)

## 2020-08-12 DIAGNOSIS — G4733 Obstructive sleep apnea (adult) (pediatric): Secondary | ICD-10-CM | POA: Diagnosis not present

## 2020-08-25 DIAGNOSIS — G4733 Obstructive sleep apnea (adult) (pediatric): Secondary | ICD-10-CM | POA: Diagnosis not present

## 2020-08-27 ENCOUNTER — Other Ambulatory Visit: Payer: Self-pay | Admitting: Primary Care

## 2020-08-27 DIAGNOSIS — R0981 Nasal congestion: Secondary | ICD-10-CM

## 2020-09-30 DIAGNOSIS — H35372 Puckering of macula, left eye: Secondary | ICD-10-CM | POA: Diagnosis not present

## 2020-09-30 DIAGNOSIS — Z961 Presence of intraocular lens: Secondary | ICD-10-CM | POA: Diagnosis not present

## 2020-10-14 ENCOUNTER — Ambulatory Visit: Payer: PPO | Admitting: Podiatry

## 2020-10-14 ENCOUNTER — Other Ambulatory Visit: Payer: Self-pay

## 2020-10-14 ENCOUNTER — Ambulatory Visit (INDEPENDENT_AMBULATORY_CARE_PROVIDER_SITE_OTHER): Payer: PPO

## 2020-10-14 ENCOUNTER — Other Ambulatory Visit: Payer: Self-pay | Admitting: Podiatry

## 2020-10-14 ENCOUNTER — Encounter: Payer: Self-pay | Admitting: Podiatry

## 2020-10-14 DIAGNOSIS — M7671 Peroneal tendinitis, right leg: Secondary | ICD-10-CM

## 2020-10-14 DIAGNOSIS — M778 Other enthesopathies, not elsewhere classified: Secondary | ICD-10-CM

## 2020-10-14 DIAGNOSIS — S9031XA Contusion of right foot, initial encounter: Secondary | ICD-10-CM

## 2020-10-14 MED ORDER — TRIAMCINOLONE ACETONIDE 40 MG/ML IJ SUSP
20.0000 mg | Freq: Once | INTRAMUSCULAR | Status: AC
  Administered 2020-10-14: 15:00:00 20 mg

## 2020-10-14 NOTE — Progress Notes (Signed)
Presents today for follow-up of his peroneal tendinitis states that seems to be doing better but he is having pain at the first metatarsophalangeal joint now especially when he tries to bend it up.  Objective: Vital signs are stable he is alert oriented x3.  Pulses are palpable.  There is no erythema edema/drainage noted he has pain on palpation first metatarsophalangeal joint and on end range of motion.  Assessment: Capsulitis osteoarthritis first metatarsophalangeal joint right foot.  Plan: Discussed etiology pathology conservative therapies at this point I went ahead and injected after sterile Betadine skin prep 10 mg Kenalog 5 mg Marcaine point maximal tenderness right.  Directly into the joint this was injected.  Discussed appropriate shoe gear follow-up with him in 6 weeks

## 2020-10-19 ENCOUNTER — Ambulatory Visit (INDEPENDENT_AMBULATORY_CARE_PROVIDER_SITE_OTHER)
Admission: RE | Admit: 2020-10-19 | Discharge: 2020-10-19 | Disposition: A | Discharge status: Home / Self Care | Payer: PPO | Source: Ambulatory Visit | Attending: Family Medicine | Admitting: Family Medicine

## 2020-10-19 ENCOUNTER — Other Ambulatory Visit: Payer: Self-pay

## 2020-10-19 ENCOUNTER — Ambulatory Visit (INDEPENDENT_AMBULATORY_CARE_PROVIDER_SITE_OTHER): Payer: PPO | Admitting: Family Medicine

## 2020-10-19 ENCOUNTER — Encounter: Payer: Self-pay | Admitting: Family Medicine

## 2020-10-19 VITALS — BP 118/68 | HR 65 | Temp 97.1°F | Ht 71.0 in | Wt 198.0 lb

## 2020-10-19 DIAGNOSIS — R202 Paresthesia of skin: Secondary | ICD-10-CM

## 2020-10-19 DIAGNOSIS — R0789 Other chest pain: Secondary | ICD-10-CM

## 2020-10-19 DIAGNOSIS — M19011 Primary osteoarthritis, right shoulder: Secondary | ICD-10-CM | POA: Diagnosis not present

## 2020-10-19 MED ORDER — OMEPRAZOLE 20 MG PO CPDR
20.0000 mg | DELAYED_RELEASE_CAPSULE | Freq: Every day | ORAL | Status: DC
Start: 2020-10-19 — End: 2020-11-27

## 2020-10-19 NOTE — Progress Notes (Signed)
This visit occurred during the SARS-CoV-2 public health emergency.  Safety protocols were in place, including screening questions prior to the visit, additional usage of staff PPE, and extensive cleaning of exam room while observing appropriate contact time as indicated for disinfecting solutions.  He had foot injection and that helped with pain.    Prev with R 2nd and 3rd finger numbness, flexor and dorsal side.  Tried icy hot for local pain.  Longstanding R hand sx.  No L handed sx.  H/o L carpal tunnel surgery.    He had some episodic R sided chest pain. Not exertional but noted with lifting/pushing/pulling. No L sided chest pain.  No sx going up stairs.  Going on episodically for a few months.  No h/o with NTG use.  Chest sx noted more at night, after having had a busy day/working.    He had noted faint tremor with fine motor work but not gross motor movement.  No resting tremor.  No FH tremor. No FH parkinsons.  Noted on R>L hand.  R handed.  He had pain with overhead triceps extensions.    No tingling in the feet.  No neck pain.  No leg cramping with walking.   Meds, vitals, and allergies reviewed.   ROS: Per HPI unless specifically indicated in ROS section   Nad Ncat Neck supple, no LA Rrr Ctab Abd soft, not ttp Normal sensation on the R 5th finger but dec sensation on the other fingers compared to L hand.   R wrist tinel positive.   R pectoral area sore with R shoulder ROM.  Left pectoral area not tender to palpation.  32 minutes were devoted to patient care in this encounter (this includes time spent reviewing the patient's file/history, interviewing and examining the patient, counseling/reviewing plan with patient).

## 2020-10-19 NOTE — Patient Instructions (Signed)
Go to the lab on the way out.   If you have mychart we'll likely use that to update you.    Get an over the counter wrist brace.  Use that and see if it helps your hand/wrist.  Wear when possible, try it at night.   Take care.  Glad to see you.

## 2020-10-21 DIAGNOSIS — R202 Paresthesia of skin: Secondary | ICD-10-CM | POA: Insufficient documentation

## 2020-10-21 DIAGNOSIS — R0789 Other chest pain: Secondary | ICD-10-CM | POA: Insufficient documentation

## 2020-10-21 NOTE — Assessment & Plan Note (Signed)
With Tinel positive at the wrist.  Likely contribution from carpal tunnel.  Discussed with patient.  He can try an over-the-counter brace and update me as needed.  See after visit summary.

## 2020-10-21 NOTE — Assessment & Plan Note (Signed)
He could have shoulder arthritis versus chest wall irritation with pectoral strain.  Still okay for outpatient follow-up.  Reasonable to check plain films today.  See notes on imaging.

## 2020-11-08 ENCOUNTER — Encounter: Payer: Self-pay | Admitting: Cardiology

## 2020-11-11 ENCOUNTER — Telehealth: Payer: Self-pay | Admitting: *Deleted

## 2020-11-11 ENCOUNTER — Other Ambulatory Visit: Payer: Self-pay | Admitting: Primary Care

## 2020-11-11 DIAGNOSIS — R0981 Nasal congestion: Secondary | ICD-10-CM

## 2020-11-11 NOTE — Telephone Encounter (Signed)
"  My husband is a patient of Dr. Milinda Pointer.  I was wondering if I could get a copy of the diagnosis with codes.  We have a supplement that we can use.  I just want to see if I can get a copy of that.  The date of service is 10/14/2020.  Dr. Milinda Pointer had gave him a diagnosis of Capsulitis."

## 2020-11-11 NOTE — Telephone Encounter (Signed)
Sent. Thanks.   

## 2020-11-24 NOTE — Telephone Encounter (Signed)
Mrs. Cartwright stopped by the office on 11/23/2020 and was given an itemized summary which had the codes on it."

## 2020-11-27 ENCOUNTER — Other Ambulatory Visit: Payer: Self-pay | Admitting: Family Medicine

## 2020-12-07 ENCOUNTER — Ambulatory Visit: Payer: PPO | Admitting: Podiatry

## 2020-12-18 DIAGNOSIS — N400 Enlarged prostate without lower urinary tract symptoms: Secondary | ICD-10-CM | POA: Diagnosis not present

## 2020-12-18 DIAGNOSIS — N5201 Erectile dysfunction due to arterial insufficiency: Secondary | ICD-10-CM | POA: Diagnosis not present

## 2020-12-18 DIAGNOSIS — N200 Calculus of kidney: Secondary | ICD-10-CM | POA: Diagnosis not present

## 2020-12-23 ENCOUNTER — Ambulatory Visit: Payer: PPO | Admitting: Podiatry

## 2020-12-23 ENCOUNTER — Encounter: Payer: Self-pay | Admitting: Podiatry

## 2020-12-23 ENCOUNTER — Other Ambulatory Visit: Payer: Self-pay

## 2020-12-23 DIAGNOSIS — M778 Other enthesopathies, not elsewhere classified: Secondary | ICD-10-CM

## 2020-12-23 DIAGNOSIS — M205X1 Other deformities of toe(s) (acquired), right foot: Secondary | ICD-10-CM | POA: Diagnosis not present

## 2020-12-23 NOTE — Progress Notes (Signed)
He presents today for follow-up of his capsulitis of his right foot regarding the first metatarsophalangeal joint of the right foot.  Objective: Still has tenderness on range of motion particularly in range of motion.  I reviewed previous radiographs indicating osteoarthritic changes joint space narrowing and dorsal spurring.  Assessment: Chronic capsulitis hallux limitus first metatarsophalangeal joint right.  Plan: At this point I am recommending MRI for surgical consideration and differential diagnosis.

## 2020-12-31 ENCOUNTER — Other Ambulatory Visit: Payer: Self-pay | Admitting: Cardiology

## 2021-01-08 ENCOUNTER — Other Ambulatory Visit: Payer: PPO

## 2021-01-10 ENCOUNTER — Ambulatory Visit
Admission: RE | Admit: 2021-01-10 | Discharge: 2021-01-10 | Disposition: A | Discharge status: Home / Self Care | Payer: PPO | Source: Ambulatory Visit | Attending: Podiatry | Admitting: Podiatry

## 2021-01-10 ENCOUNTER — Other Ambulatory Visit: Payer: Self-pay

## 2021-01-10 DIAGNOSIS — M25474 Effusion, right foot: Secondary | ICD-10-CM | POA: Diagnosis not present

## 2021-01-10 DIAGNOSIS — M778 Other enthesopathies, not elsewhere classified: Secondary | ICD-10-CM

## 2021-01-10 DIAGNOSIS — M205X1 Other deformities of toe(s) (acquired), right foot: Secondary | ICD-10-CM

## 2021-01-10 DIAGNOSIS — M19071 Primary osteoarthritis, right ankle and foot: Secondary | ICD-10-CM | POA: Diagnosis not present

## 2021-01-12 ENCOUNTER — Ambulatory Visit (HOSPITAL_BASED_OUTPATIENT_CLINIC_OR_DEPARTMENT_OTHER): Payer: PPO | Admitting: Cardiology

## 2021-01-13 ENCOUNTER — Encounter (HOSPITAL_BASED_OUTPATIENT_CLINIC_OR_DEPARTMENT_OTHER): Payer: Self-pay | Admitting: Cardiology

## 2021-01-13 ENCOUNTER — Ambulatory Visit (HOSPITAL_BASED_OUTPATIENT_CLINIC_OR_DEPARTMENT_OTHER): Payer: PPO | Admitting: Cardiology

## 2021-01-13 ENCOUNTER — Other Ambulatory Visit: Payer: Self-pay

## 2021-01-13 ENCOUNTER — Other Ambulatory Visit: Payer: Self-pay | Admitting: Family Medicine

## 2021-01-13 VITALS — BP 126/70 | HR 64 | Ht 71.0 in | Wt 194.0 lb

## 2021-01-13 DIAGNOSIS — Z8249 Family history of ischemic heart disease and other diseases of the circulatory system: Secondary | ICD-10-CM | POA: Diagnosis not present

## 2021-01-13 DIAGNOSIS — I2584 Coronary atherosclerosis due to calcified coronary lesion: Secondary | ICD-10-CM

## 2021-01-13 DIAGNOSIS — I10 Essential (primary) hypertension: Secondary | ICD-10-CM | POA: Diagnosis not present

## 2021-01-13 DIAGNOSIS — Z8616 Personal history of COVID-19: Secondary | ICD-10-CM

## 2021-01-13 DIAGNOSIS — E7800 Pure hypercholesterolemia, unspecified: Secondary | ICD-10-CM

## 2021-01-13 DIAGNOSIS — I2510 Atherosclerotic heart disease of native coronary artery without angina pectoris: Secondary | ICD-10-CM

## 2021-01-13 MED ORDER — NITROGLYCERIN 0.4 MG SL SUBL: 0.4000 mg | SUBLINGUAL_TABLET | SUBLINGUAL | 3 refills | Status: DC | PRN

## 2021-01-13 NOTE — Patient Instructions (Signed)

## 2021-01-13 NOTE — Progress Notes (Signed)
T

## 2021-01-13 NOTE — Progress Notes (Signed)
Cardiology Office Note:    Date:  01/13/2021   ID:  Trevor Ross, DOB Aug 31, 1945, MRN [id number]  PCP:  Trevor Ghent, MD  Cardiologist:  Trevor Dresser, MD  Referring MD: Trevor Ghent, MD   CC: follow up  History of Present Illness:    Trevor Ross is a 75 y.o. male with a hx of arthritis, vascular calcification who is seen for follow up today. I initially met him 01/08/20 as a new consult at the request of Trevor Ghent, MD for the evaluation and management of possible coronary artery disease and chest pain.  Today: Doing well overall. Recovered from Covid well. Has occasional arm/shoulder pain from arthritis, more on right side, no central chest pressure/pain.   Denies chest pain, shortness of breath at rest or with normal exertion. No PND, orthopnea, LE edema or unexpected weight gain. No syncope or palpitations.   May need ankle surgery. Can achieve >4 METs without limitations, very active at baseline. Unless he develops a change in symptoms, would not require stress testing prior to surgery.  Past Medical History:  Diagnosis Date   Allergy    Arthritis    hands   Diverticulosis of colon 09/15/2006   Dysphagia    Hiatal hernia    Nasal polyps    Nephrolithiasis    PSA per Dr. Serita Ross   OSA (obstructive sleep apnea)     Past Surgical History:  Procedure Laterality Date   APPENDECTOMY  1988   CARPAL TUNNEL RELEASE Left    CATARACT EXTRACTION Bilateral    Left knee repair  1970's   LITHOTRIPSY     twice per pt   right knee surgery     arthroscopic   Sinus surgery for polyps      Current Medications: Current Outpatient Medications on File Prior to Visit  Medication Sig   amLODipine (NORVASC) 2.5 MG tablet TAKE 1 TABLET BY MOUTH ONCE A DAY   aspirin EC 81 MG tablet Take 1 tablet (81 mg total) by mouth daily. Swallow whole.   fluticasone (FLONASE) 50 MCG/ACT nasal spray PLACE 1 SPRAY INTO BOTH NOSTRILS 2 TIMESDAILY   multivitamin-lutein  (OCUVITE-LUTEIN) CAPS capsule Take 1 capsule by mouth daily.   omeprazole (PRILOSEC) 20 MG capsule TAKE 2 CAPSULES BY MOUTH DAILY   Phenylephrine HCl (SINEX REGULAR NA) Place into the nose. Use daily as needed   rosuvastatin (CRESTOR) 20 MG tablet TAKE 1 TABLET BY MOUTH ONCE A DAY   No current facility-administered medications on file prior to visit.     Allergies:   Patient has no known allergies.   Social History   Tobacco Use   Smoking status: Never   Smokeless tobacco: Never  Vaping Use   Vaping Use: Never used  Substance Use Topics   Alcohol use: Yes    Alcohol/week: 0.0 standard drinks    Comment: rare   Drug use: No    Family History: family history includes Heart disease in his mother; Prostate cancer in his father. There is no history of Colon cancer. mother had CHF, passed away age 24. Father died suddenly, presumed heart attack, at age 70. Mat Gma had cancer, may have had heart issues as well. Mat Gpa died in his 69s, unknown cause. [name] died in his 39s, unknown cause. [name] also died of unknown causes.  ROS:   Please see the history of present illness.  Additional pertinent ROS otherwise unremarkable.  EKGs/Labs/Other Studies Reviewed:  The following studies were reviewed today: CT cardiac 01/16/20 RCA is a large dominant artery that gives rise to PDA and PLA. Calcified plaque in noted throughout the RCA causing moderate stenosis in the proximal and mid segments (50-69%) and mild stenosis in the distal segment (25-49%)   Left main is a large artery that gives rise to LAD and LCX arteries. The is mild calcified plaque in the distal LM causing minimal stenosis (0-24%)   LAD is a large vessel that has calcified plaque in the proximal to mid segments causing moderate stenosis (50-69%).   LCX is a non-dominant artery that gives rise to one large OM1 branch. There is calcified plaque in the proximal first obtuse marginal branch causing mild stenosis  (25-49%).   Other findings:   Normal pulmonary vein drainage into the left atrium.   Normal left atrial appendage without a thrombus.   Normal size of the pulmonary artery.   IMPRESSION: 1. High coronary calcium score of 2616. This was 94th percentile for age and sex matched control ([place]).   2. Normal coronary origin with right dominance.   3. Heavy calcified plaque causing moderate stenosis (50-69%) in the proximal to mid segments of the LAD and RCA   4. CAD-RADS 3. Moderate stenosis. Consider symptom-guided anti-ischemic pharmacotherapy as well as risk factor modification per guideline directed care.   Additional analysis with CT FFR will be submitted and reported separately.   5.  Asa, statin recommended if no contraindications.  FFR 01/16/20 1. Left Main:  No significant stenosis.   2. LAD: No significant focal stenosis. FFR 0.93 proximally, FFR 0.84 in the mid vessel, and slowly tapers to 0.75 distally with no focal stenosis 3. LCX: No significant stenosis. FFR 0.9 proximally, FFR 0.89 distally 4. RCA: No significant stenosis.  FFR 0.92   IMPRESSION: 1.  CT FFR analysis didn't show any significant focal stenosis.  EKG:  EKG is personally reviewed. 01/13/21 NSR at 64 bpm 04/14/20 sinus rhythm  Recent Labs: 03/06/2020: ALT 12 04/14/2020: BUN 15; Creatinine, Ser 1.21; Hemoglobin 13.7; Platelets 248; Potassium 4.1; Sodium 136  Recent Lipid Panel    Component Value Date/Time   CHOL 112 03/06/2020 0941   TRIG 74 03/06/2020 0941   HDL 50 03/06/2020 0941   CHOLHDL 2.2 03/06/2020 0941   CHOLHDL 4 02/15/2019 0853   VLDL 17.6 02/15/2019 0853   LDLCALC 47 03/06/2020 0941    Physical Exam:    VS:  BP 126/70   Pulse 64   Ht 5' 11"  (1.803 m)   Wt 194 lb (88 kg)   BMI 27.06 kg/m     Wt Readings from Last 3 Encounters:  01/13/21 194 lb (88 kg)  10/19/20 198 lb (89.8 kg)  07/15/20 196 lb (88.9 kg)    GEN: Well nourished, well developed in no acute  distress HEENT: Normal, moist mucous membranes NECK: No JVD CARDIAC: regular rhythm, normal S1 and S2, no rubs or gallops. No murmur. VASCULAR: Radial and DP pulses 2+ bilaterally. No carotid bruits RESPIRATORY:  Clear to auscultation without rales, wheezing or rhonchi  ABDOMEN: Soft, non-tender, non-distended MUSCULOSKELETAL:  Ambulates independently SKIN: Warm and dry, no edema NEUROLOGIC:  Alert and oriented x 3. No focal neuro deficits noted. PSYCHIATRIC:  Normal affect     ASSESSMENT:    1. Coronary artery disease due to calcified coronary lesion   2. Pure hypercholesterolemia   3. FH: CAD (coronary artery disease)   4. Personal history of COVID-19   5.  Essential hypertension     PLAN:    Covid infection: recovered well, no lingering issues  Precordial pain, now resolved with only occasional MSK shoulder pain CAD on CT cardiac Vascular calcification Family history of heart disease -CTshows elevated calcium score and moderate CAD, but FFR was negative for flow limiting obstruction -he is now on aspirin 81 mg daily and rosuvastatin 20 mg daily -has PRN SL NG, understands how to use and risk with PDE5i -given that he had ER visit he had negative troponins with his chest pain, suspect it is noncardiac in etiology -no chest pain since starting PPI -only atypical pain currently -counseled on red flag warning signs that need immediate medical attention  Hypertension -well controlled on amlodipine  Hypercholesterolemia -continue rosuvastatin 20 mg daily given CAD, vascular calcification -recheck lipids 03/06/20 show LDL has gone from 128 to 47, at goal of <70  Cardiac risk counseling and prevention recommendations: -recommend heart healthy/Mediterranean diet, with whole grains, fruits, vegetable, fish, lean meats, nuts, and olive oil. Limit salt. -recommend moderate walking, 3-5 times/week for 30-50 minutes each session. Aim for at least 150 minutes/week. Goal should be  pace of 3 miles/hours, or walking 1.5 miles in 30 minutes -recommend avoidance of tobacco products. Avoid excess alcohol.   Plan for follow up: 12 mos or sooner  Trevor Dresser, MD, PhD, [facility]    Medication Adjustments/Labs and Tests Ordered: Current medicines are reviewed at length with the patient today.  Concerns regarding medicines are outlined above.  Orders Placed This Encounter  Procedures   EKG 12-Lead    Meds ordered this encounter  Medications   nitroGLYCERIN (NITROSTAT) 0.4 MG SL tablet    Sig: Place 1 tablet (0.4 mg total) under the tongue every 5 (five) minutes as needed for chest pain (max 3 doses).    Dispense:  25 tablet    Refill:  3     Patient Instructions  Medication Instructions:  Your Physician recommend you continue on your current medication as directed.    *If you need a refill on your cardiac medications before your next appointment, please call your pharmacy*   Lab Work: None ordered today   Testing/Procedures: None ordered today   Follow-Up: At [facility], you and your health needs are our priority.  As part of our continuing mission to provide you with exceptional heart care, we have created designated Provider Care Teams.  These Care Teams include your primary Cardiologist (physician) and Advanced Practice Providers (APPs -  Physician Assistants and Nurse Practitioners) who all work together to provide you with the care you need, when you need it.  We recommend signing up for the patient portal called "MyChart".  Sign up information is provided on this After Visit Summary.  MyChart is used to connect with patients for Virtual Visits (Telemedicine).  Patients are able to view lab/test results, encounter notes, upcoming appointments, etc.  Non-urgent messages can be sent to your provider as well.   To learn more about what you can do with MyChart, go to [email or web address].    Your next appointment:    1 year(s)  The format for your next appointment:   In Person  Provider:   Buford Dresser, MD    Signed, Trevor Dresser, MD PhD 01/13/2021     [place]

## 2021-02-09 ENCOUNTER — Encounter: Payer: Self-pay | Admitting: Podiatry

## 2021-02-09 ENCOUNTER — Ambulatory Visit: Payer: PPO | Admitting: Podiatry

## 2021-02-09 ENCOUNTER — Other Ambulatory Visit: Payer: Self-pay

## 2021-02-09 DIAGNOSIS — M205X1 Other deformities of toe(s) (acquired), right foot: Secondary | ICD-10-CM | POA: Diagnosis not present

## 2021-02-10 NOTE — Progress Notes (Signed)
He presents today for follow-up of his MRI regarding his right foot.  States that he really has not gotten any better and it seems to be worsening all the time.  Getting to the point his affecting his ability to perform his daily activities and enjoy life.  Objective: Vital signs are stable he is alert oriented x3 I have reviewed his past medical history medications allergies surgeries and social history most prevalent for cardiac issue as of late.  He still has pain on range of motion of the first metatarsophalangeal joint and to a lesser degree the second.  MRI does demonstrate significant or severe osteoarthritic changes of the first metatarsophalangeal joint of the right foot and moderate osteoarthritic changes to the second metatarsophalangeal joint.  He states that this 1 is less tender.  Assessment hallux limitus with capsulitis and osteoarthritis second metatarsophalangeal joint.  Plan: Consented him today for a Keller arthroplasty with a single silicone implant right foot.  Also consider him for second metatarsal osteotomy and screw fixation.  He understands this and is amendable to it.  We did discuss the possible complications which may include but not limited to postop pain bleeding swelling infection recurrence need for further surgery overcorrection under correction inability to move the toe in the fashion that he wants to.  Provided him with information regarding the surgery center and anesthesia group.  He will follow-up with [place] in the near future after cardiac clearance for surgery.

## 2021-02-23 ENCOUNTER — Encounter: Payer: Self-pay | Admitting: Podiatry

## 2021-02-23 ENCOUNTER — Telehealth (HOSPITAL_BASED_OUTPATIENT_CLINIC_OR_DEPARTMENT_OTHER): Payer: Self-pay | Admitting: Cardiology

## 2021-02-23 NOTE — Telephone Encounter (Signed)
   Name: Trevor Ross  DOB: 06/15/1945  MRN: [id number]   Primary Cardiologist: Buford Dresser, MD  Chart reviewed as part of pre-operative protocol coverage. Patient was contacted 02/23/2021 in reference to pre-operative risk assessment for pending surgery as outlined below.  Trevor Ross was last seen on 01/13/2021 by Dr. Buford Dresser.  Since that day, Trevor Ross has done well without exertional chest pain or worsening dyspnea.  Therefore, based on ACC/AHA guidelines, the patient would be at acceptable risk for the planned procedure without further cardiovascular testing.   It is ideal to continue on aspirin through the surgery, however if absolutely needed, he may hold aspirin for 5 days prior to the surgery and restart as soon as possible afterward at the surgeon's discretion.  The patient was advised that if he develops new symptoms prior to surgery to contact our office to arrange for a follow-up visit, and he verbalized understanding.  I will route this recommendation to the requesting party via Epic fax function and remove from pre-op pool. Please call with questions.  [place], [place] 02/23/2021, 3:12 PM

## 2021-02-23 NOTE — Telephone Encounter (Signed)
   [facility] Pre-operative Risk Assessment    Patient Name: Trevor Ross  DOB: Sep 15, 1945 MRN: [id number]   Request for surgical clearance:  What type of surgery is being performed? Keller Arthroplasty with single silicone implant right foot---also consider for second metatarsal osteotomy and screw fixation  When is this surgery scheduled? TBD  What type of clearance is required (medical clearance vs. Pharmacy clearance to hold med vs. Both)? Both  Are there any medications that need to be held prior to surgery and how long? None specified---pt taking ASA 81  Practice name and name of physician performing surgery? [facility]; Lanelle Bal, DPM  What is the office phone number? [phone]   7.   What is the office fax number? [phone]  8.   Anesthesia type (None, local, MAC, general) ? General Anesthetic   Francella Solian 02/23/2021, 2:16 PM   Last OV note from Dr. Harrell Gave 01/13/21:  May need ankle surgery. Can achieve >4 METs without limitations, very active at baseline. Unless he develops a change in symptoms, would not require stress testing prior to surgery. _________________________________________________________________   (provider comments below)

## 2021-03-03 ENCOUNTER — Telehealth: Payer: Self-pay

## 2021-03-03 NOTE — Telephone Encounter (Signed)
DOS 03/19/2021  KELLER BUNION IMPLANT RT - 19597 METATARSAL OSTEOTOMY 2ND RT - [id number]  [employer]  RECEIVED AUTH FAX FROM [employer]. CPT [id number] & [id number] APPROVED FROM 03/19/2021 - 06/17/2021. AUTH # [id number]

## 2021-03-10 ENCOUNTER — Encounter: Payer: Self-pay | Admitting: Podiatry

## 2021-03-10 ENCOUNTER — Encounter: Payer: Self-pay | Admitting: Family Medicine

## 2021-03-17 ENCOUNTER — Other Ambulatory Visit: Payer: Self-pay | Admitting: Podiatry

## 2021-03-17 MED ORDER — ONDANSETRON HCL 4 MG PO TABS: 4.0000 mg | ORAL_TABLET | Freq: Three times a day (TID) | ORAL | 0 refills | Status: DC | PRN

## 2021-03-17 MED ORDER — OXYCODONE-ACETAMINOPHEN 10-325 MG PO TABS
1.0000 | ORAL_TABLET | Freq: Three times a day (TID) | ORAL | 0 refills | Status: AC | PRN
Start: 2021-03-17 — End: 2021-03-24

## 2021-03-17 MED ORDER — CEPHALEXIN 500 MG PO CAPS: 500.0000 mg | ORAL_CAPSULE | Freq: Three times a day (TID) | ORAL | 0 refills | Status: DC

## 2021-03-18 DIAGNOSIS — M170 Bilateral primary osteoarthritis of knee: Secondary | ICD-10-CM | POA: Diagnosis not present

## 2021-03-18 DIAGNOSIS — M25761 Osteophyte, right knee: Secondary | ICD-10-CM | POA: Diagnosis not present

## 2021-03-18 DIAGNOSIS — M25762 Osteophyte, left knee: Secondary | ICD-10-CM | POA: Diagnosis not present

## 2021-03-19 DIAGNOSIS — M19071 Primary osteoarthritis, right ankle and foot: Secondary | ICD-10-CM | POA: Diagnosis not present

## 2021-03-19 DIAGNOSIS — M205X1 Other deformities of toe(s) (acquired), right foot: Secondary | ICD-10-CM | POA: Diagnosis not present

## 2021-03-19 DIAGNOSIS — M2021 Hallux rigidus, right foot: Secondary | ICD-10-CM | POA: Diagnosis not present

## 2021-03-19 DIAGNOSIS — M216X1 Other acquired deformities of right foot: Secondary | ICD-10-CM | POA: Diagnosis not present

## 2021-03-19 DIAGNOSIS — M25571 Pain in right ankle and joints of right foot: Secondary | ICD-10-CM | POA: Diagnosis not present

## 2021-03-19 DIAGNOSIS — M21541 Acquired clubfoot, right foot: Secondary | ICD-10-CM | POA: Diagnosis not present

## 2021-03-19 DIAGNOSIS — M21611 Bunion of right foot: Secondary | ICD-10-CM | POA: Diagnosis not present

## 2021-03-24 ENCOUNTER — Ambulatory Visit (INDEPENDENT_AMBULATORY_CARE_PROVIDER_SITE_OTHER): Payer: PPO | Admitting: Podiatry

## 2021-03-24 ENCOUNTER — Ambulatory Visit (INDEPENDENT_AMBULATORY_CARE_PROVIDER_SITE_OTHER): Payer: PPO

## 2021-03-24 ENCOUNTER — Other Ambulatory Visit: Payer: Self-pay

## 2021-03-24 DIAGNOSIS — M205X1 Other deformities of toe(s) (acquired), right foot: Secondary | ICD-10-CM | POA: Diagnosis not present

## 2021-03-24 DIAGNOSIS — Z9889 Other specified postprocedural states: Secondary | ICD-10-CM

## 2021-03-24 DIAGNOSIS — M21961 Unspecified acquired deformity of right lower leg: Secondary | ICD-10-CM

## 2021-03-24 NOTE — Progress Notes (Signed)
He presents today for his first postop visit date of surgery 03/19/2021 status post Trevor Ross arthroplasty single silicone implant and metatarsal osteotomy second.  Denies fever chills nausea vomiting muscle aches pains calf pain back pain chest pain shortness of breath.  States that his left knee has locked up on him and he is currently wearing a brace and using a crutch.  Objective: Vital signs are stable he is alert and oriented x3 there is no erythema edema cellulitis drainage or odor after dressed her dressing was removed.  Incision sites appear to be healing very nicely is got great range of motion of the first metatarsophalangeal joint.  Radiographs taken today demonstrate an osseously mature individual Keller arthroplasty is intact with no grommets.  He has 2 screws to the distal aspect of the second metatarsal and the toes are in great alignment.  Assessment: Well-healing surgical foot right.  Plan: Redressed today dressed a compressive dressing I encouraged range of motion exercises discussed these with him and his family.  I will follow-up with him in about 2 weeks

## 2021-03-31 ENCOUNTER — Encounter: Payer: PPO | Admitting: Podiatry

## 2021-03-31 ENCOUNTER — Other Ambulatory Visit: Payer: Self-pay

## 2021-03-31 ENCOUNTER — Ambulatory Visit (INDEPENDENT_AMBULATORY_CARE_PROVIDER_SITE_OTHER): Payer: PPO | Admitting: Podiatry

## 2021-03-31 DIAGNOSIS — M21961 Unspecified acquired deformity of right lower leg: Secondary | ICD-10-CM

## 2021-03-31 DIAGNOSIS — M205X1 Other deformities of toe(s) (acquired), right foot: Secondary | ICD-10-CM

## 2021-04-03 NOTE — Progress Notes (Signed)
  Subjective:  Patient ID: Trevor Ross, male    DOB: [birth year],  MRN: [id number]  Chief Complaint  Patient presents with   Routine Post Op     POV #2 DOS 03/19/2021 KELLER ARTHROPLASTY W/SINGLE SILICONE IMPLANT RT, 2ND METATARSAL OSTEOTOMY RT/DR HYATT PT    75 y.o. male returns for post-op check. Overall doing well pain improving  Review of Systems: Negative except as noted in the HPI. Denies N/V/F/Ch.   Objective:  There were no vitals filed for this visit. There is no height or weight on file to calculate BMI. Constitutional Well developed. Well nourished.  Vascular Foot warm and well perfused. Capillary refill normal to all digits.   Neurologic Normal speech. Oriented to person, place, and time. Epicritic sensation to light touch grossly present bilaterally.  Dermatologic Skin healing well without signs of infection. Skin edges well coapted without signs of infection.  Orthopedic: Tenderness to palpation noted about the surgical site.    Assessment:   1. Hallux limitus, right   2. Metatarsal deformity, right    Plan:  Patient was evaluated and treated and all questions answered.  S/p foot surgery right -Progressing as expected post-operatively. -Sutures removed and steri strips applied -Return in 2 weeks to see Dr Milinda Pointer -May begin bathing. No soaking or scrubbing incision -Continue WBAT in CAM boot until next visit  Return in about 2 weeks (around 04/14/2021).

## 2021-04-04 ENCOUNTER — Encounter (HOSPITAL_BASED_OUTPATIENT_CLINIC_OR_DEPARTMENT_OTHER): Payer: Self-pay

## 2021-04-04 ENCOUNTER — Encounter: Payer: Self-pay | Admitting: Cardiology

## 2021-04-04 ENCOUNTER — Encounter: Payer: Self-pay | Admitting: Family Medicine

## 2021-04-05 ENCOUNTER — Telehealth: Payer: Self-pay | Admitting: *Deleted

## 2021-04-05 NOTE — Telephone Encounter (Signed)
PRINTED OFF SCANNED IN CLEARANCE DOCUMENT PT SENT OVER. HAS NOW BEEN TURNED INTO A PRE OP CLEARANCE NOTE AND HAS BEEN SENT TO PRE OP POOL FOR ASSESSMENT.

## 2021-04-05 NOTE — Telephone Encounter (Signed)
   Pre-operative Risk Assessment    Patient Name: Trevor Ross  DOB: 05-07-1946 MRN: [id number]      Request for Surgical Clearance   Procedure:   LEFT TKR  Date of Surgery: Clearance TBD                                 Surgeon:  DR. Lara Mulch Surgeon's Group or Practice Name:  [facility] Phone number:  [phone] Fax number:  [phone]   Type of Clearance Requested: - Medical  - Pharmacy:  Hold Aspirin     Type of Anesthesia:   Not Indicated   Additional requests/questions:   Jiles Prows   04/05/2021, 4:54 PM

## 2021-04-05 NOTE — Telephone Encounter (Signed)
FYI

## 2021-04-06 NOTE — Telephone Encounter (Signed)
   Name: Trevor Ross  DOB: October 15, 1945  MRN: [id number]   Primary Cardiologist: Buford Dresser, MD  Chart reviewed as part of pre-operative protocol coverage. Patient was contacted 04/06/2021 in reference to pre-operative risk assessment for pending surgery as outlined below.  Trevor Ross was last seen on 01/13/21 by Dr. Harrell Gave.  Since that day, Trevor Ross has done well. He recently underwent second metatarsal osteotomy with implant and is limited by this and by the boot he is wearing. Prior to surgery, he could complete more than 4.0 METS without angina. He had a reassuring CCTA with nonobstructive disease.    Given the patient's comorbid conditions, we prefer to continue ASA throughout the perioperative period. However, if doing so significantly increases morbidity or mortality, may hold for 5-7 days.   Therefore, based on ACC/AHA guidelines, the patient would be at acceptable risk for the planned procedure without further cardiovascular testing.   The patient was advised that if he develops new symptoms prior to surgery to contact our office to arrange for a follow-up visit, and he verbalized understanding.  I will route this recommendation to the requesting party via Epic fax function and remove from pre-op pool. Please call with questions.  Tami Lin Ardie Dragoo, PA 04/06/2021, 11:22 AM

## 2021-04-09 ENCOUNTER — Telehealth: Payer: Self-pay | Admitting: Primary Care

## 2021-04-13 NOTE — Telephone Encounter (Signed)
Called Becky back- ok per DPR  This pt needs appt- no visit in over a year-? Does he want to be seen in [place] or [place]?- LMTCB

## 2021-04-14 ENCOUNTER — Other Ambulatory Visit: Payer: Self-pay

## 2021-04-14 ENCOUNTER — Ambulatory Visit (INDEPENDENT_AMBULATORY_CARE_PROVIDER_SITE_OTHER): Payer: PPO | Admitting: Podiatry

## 2021-04-14 ENCOUNTER — Encounter: Payer: Self-pay | Admitting: Podiatry

## 2021-04-14 ENCOUNTER — Ambulatory Visit (INDEPENDENT_AMBULATORY_CARE_PROVIDER_SITE_OTHER): Payer: PPO

## 2021-04-14 DIAGNOSIS — M205X1 Other deformities of toe(s) (acquired), right foot: Secondary | ICD-10-CM | POA: Diagnosis not present

## 2021-04-14 DIAGNOSIS — Z9889 Other specified postprocedural states: Secondary | ICD-10-CM

## 2021-04-14 DIAGNOSIS — M21961 Unspecified acquired deformity of right lower leg: Secondary | ICD-10-CM

## 2021-04-14 MED ORDER — MUPIROCIN 2 % EX OINT: 1.0000 "application " | TOPICAL_OINTMENT | Freq: Two times a day (BID) | CUTANEOUS | 2 refills | Status: DC

## 2021-04-15 NOTE — Progress Notes (Signed)
  Subjective:  Patient ID: Trevor Ross, male    DOB: 1946/01/18,  MRN: [id number]  Chief Complaint  Patient presents with   Routine Post Op    POV #3 DOS 03/19/2021 KELLER ARTHROPLASTY W/SINGLE SILICONE IMPLANT RT, 2ND METATARSAL OSTEOTOMY RT   "Its feeling pretty good"    75 y.o. male returns for post-op check. Overall doing well pain improving  Review of Systems: Negative except as noted in the HPI. Denies N/V/F/Ch.   Objective:  There were no vitals filed for this visit. There is no height or weight on file to calculate BMI. Constitutional Well developed. Well nourished.  Vascular Foot warm and well perfused. Capillary refill normal to all digits.   Neurologic Normal speech. Oriented to person, place, and time. Epicritic sensation to light touch grossly present bilaterally.  Dermatologic Small area of delayed healing over both incisions with no dehiscence.  Superficial serous drainage  Orthopedic: Tenderness to palpation noted about the surgical site.   New weightbearing radiographs today show no change in alignment good consolidation across the osteotomy site and stable implant position Assessment:   1. Hallux limitus, right   2. Metatarsal deformity, right   3. Status post right foot surgery    Plan:  Patient was evaluated and treated and all questions answered.  S/p foot surgery right -Progressing as expected post-operatively. -Rx for mupirocin ointment sent to pharmacy to apply daily to the areas of delayed healing -Return in 2 weeks to see Dr Milinda Pointer -May continue bathing. No soaking or scrubbing incision -Continue WBAT in CAM boot until next visit should be able to transition to shoes at that point  Return in 2 weeks (on 04/28/2021) for POV w/ xray.

## 2021-04-20 NOTE — Progress Notes (Deleted)
@Patient  ID: Trevor Ross, male    DOB: 1945-06-12, 75 y.o.   MRN: [id number]  No chief complaint on file.   Referring provider: Tonia Ghent, MD  HPI: 75 year old male, never smoked. PMH significant for OSA. Previous patient of Dr. Alva Garnet, last seen on 07/05/18. PSG 2018 showed moderate-severe OSA with AHI 32/hr. Maintained on auto CPAP 5-20cm h20.   Previous [facility] pulmonary encounter:  06/12/2019 Patient contacted today for 1 year follow-up OSA. Patient is compliant with CPAP uses. No reported problems with machine or mask. Uses nasal mask. Feels his pressure setting is too high. Sleeping through the night well, no reported daytime fatigue or somnolence. Continue to follow up with PCP for regular check ups. DME company is [employer].   Airview download 05/12/19-06/10/19: Average usage 30/30 days; 100% >4 hours Pressure 5-10cm h20 (9.7- 95%) Leaks- 31.6L/min AHI 1.3  04/21/2021- interim hx  Patient presents today for overdue follow-up OSA.          No Known Allergies  Immunization History  Administered Date(s) Administered   Influenza,inj,Quad PF,6+ Mos 02/14/2017, 02/15/2018, 02/19/2019   Influenza-Unspecified 03/05/2020, 02/26/2021   PFIZER(Purple Top)SARS-COV-2 Vaccination 07/05/2019, 07/30/2019   Pneumococcal Conjugate-13 02/14/2017   Pneumococcal Polysaccharide-23 02/15/2018   Td 03/12/2003   Tdap 02/28/2017   Zoster, Live 11/01/2012    Past Medical History:  Diagnosis Date   Allergy    Arthritis    hands   Diverticulosis of colon 09/15/2006   Dysphagia    Hiatal hernia    Nasal polyps    Nephrolithiasis    PSA per Dr. Serita Butcher   OSA (obstructive sleep apnea)     Tobacco History: Social History   Tobacco Use  Smoking Status Never  Smokeless Tobacco Never   Counseling given: Not Answered   Outpatient Medications Prior to Visit  Medication Sig Dispense Refill   amLODipine (NORVASC) 2.5 MG tablet TAKE 1 TABLET BY MOUTH ONCE A DAY 90 tablet  1   aspirin EC 81 MG tablet Take 1 tablet (81 mg total) by mouth daily. Swallow whole. 90 tablet 3   FLUAD QUADRIVALENT 0.5 ML injection      fluticasone (FLONASE) 50 MCG/ACT nasal spray PLACE 1 SPRAY INTO BOTH NOSTRILS 2 TIMESDAILY 16 g 5   multivitamin-lutein (OCUVITE-LUTEIN) CAPS capsule Take 1 capsule by mouth daily.     mupirocin ointment (BACTROBAN) 2 % Apply 1 application topically 2 (two) times daily. 30 g 2   nitroGLYCERIN (NITROSTAT) 0.4 MG SL tablet Place 1 tablet (0.4 mg total) under the tongue every 5 (five) minutes as needed for chest pain (max 3 doses). 25 tablet 3   omeprazole (PRILOSEC) 20 MG capsule TAKE 2 CAPSULES BY MOUTH DAILY 180 capsule 1   ondansetron (ZOFRAN) 4 MG tablet Take 1 tablet (4 mg total) by mouth every 8 (eight) hours as needed. 20 tablet 0   Phenylephrine HCl (SINEX REGULAR NA) Place into the nose. Use daily as needed     rosuvastatin (CRESTOR) 20 MG tablet TAKE 1 TABLET BY MOUTH ONCE A DAY 90 tablet 3   No facility-administered medications prior to visit.      Review of Systems  Review of Systems   Physical Exam  There were no vitals taken for this visit. Physical Exam   Lab Results:  CBC    Component Value Date/Time   WBC 8.9 04/14/2020 1518   RBC 4.33 04/14/2020 1518   HGB 13.7 04/14/2020 1518   HCT 40.6 04/14/2020 1518  PLT 248 04/14/2020 1518   MCV 93.8 04/14/2020 1518   MCH 31.6 04/14/2020 1518   MCHC 33.7 04/14/2020 1518   RDW 12.1 04/14/2020 1518   LYMPHSABS 2.0 08/10/2012 1740   MONOABS 0.9 08/10/2012 1740   EOSABS 0.2 08/10/2012 1740   BASOSABS 0.0 08/10/2012 1740    BMET    Component Value Date/Time   NA 136 04/14/2020 1518   NA 139 01/08/2020 0952   K 4.1 04/14/2020 1518   CL 102 04/14/2020 1518   CO2 24 04/14/2020 1518   GLUCOSE 97 04/14/2020 1518   BUN 15 04/14/2020 1518   BUN 17 01/08/2020 0952   CREATININE 1.21 04/14/2020 1518   CALCIUM 9.2 04/14/2020 1518   GFRNONAA >60 04/14/2020 1518   GFRAA 76  01/08/2020 0952    BNP No results found for: BNP  ProBNP No results found for: PROBNP  Imaging: DG Foot Complete Right  Result Date: 04/14/2021 Please see detailed radiograph report in office note.  DG Foot Complete Right  Result Date: 03/24/2021 Please see detailed radiograph report in office note.    Assessment & Plan:   No problem-specific Assessment & Plan notes found for this encounter.     Martyn Ehrich, NP 04/20/2021

## 2021-04-21 ENCOUNTER — Ambulatory Visit: Payer: PPO | Admitting: Primary Care

## 2021-04-22 ENCOUNTER — Encounter: Payer: Self-pay | Admitting: Nurse Practitioner

## 2021-04-22 ENCOUNTER — Other Ambulatory Visit: Payer: Self-pay

## 2021-04-22 ENCOUNTER — Telehealth (INDEPENDENT_AMBULATORY_CARE_PROVIDER_SITE_OTHER): Payer: PPO | Admitting: Nurse Practitioner

## 2021-04-22 DIAGNOSIS — G4733 Obstructive sleep apnea (adult) (pediatric): Secondary | ICD-10-CM | POA: Diagnosis not present

## 2021-04-22 NOTE — Assessment & Plan Note (Signed)
Compliant with therapy. Continue CPAP set pressure 8 cmH2O with nasal mask. Follow up with [employer] regarding troubleshooting machine.   Patient Instructions  Continue to use CPAP every night, minimum of 4-6 hours a night.  Change equipment every 30 days or as directed by DME. Wash your tubing with warm soap and water daily, hang to dry. Wash humidifier portion weekly.  Maintain clean equipment, as directed by home health agency.  Be aware of reduced alertness and do not drive or operate heavy machinery if experiencing this or drowsiness.  Exercise encouraged, as tolerated. Healthy weight management discussed.  Avoid or decrease alcohol consumption and medications that make you more sleepy, if possible. Notify if persistent daytime sleepiness occurs even with consistent use of CPAP.   Can obtain a new machine at 5 years. Contact [employer] for machine troubleshooting in the interim.   Follow up in one year with Dr. Halford Chessman (new consult visit) or APP. If symptoms do not improve or worsen, please contact office for sooner follow up or seek emergency care.

## 2021-04-22 NOTE — Progress Notes (Signed)
Patient ID: Trevor Ross, male     DOB: 01/05/1946, 75 y.o.      MRN: [id number]  Chief Complaint  Patient presents with   Follow-up    Virtual Visit via Telephone Note  I connected with Trevor Ross on 04/22/21 at  3:00 PM EST by a telephone enabled telemedicine application and verified that I am speaking with the correct person using two identifiers.  Location: Patient: Home Provider: Office   I discussed the limitations of evaluation and management by telemedicine and the availability of in person appointments. The patient expressed understanding and agreed to proceed.  History of Present Illness: 75 year old male, never smoker followed for obstructive sleep apnea.  He was previously a patient of Dr. Alva Garnet and last seen via virtual visit by Volanda Napoleon, NP on 06/12/2019.  Past medical history significant for GERD, vertigo, hypertension, HLD.  06/12/2019: Virtual visit with Walsh,NP.  Compliant with CPAP auto therapy 5-10cmH2O, AHI 1.3.  Reported feeling like his pressure setting is too high. Leaks 31.6 L/min. Decreased to 8 cm H2O. F/u in a year.   04/22/2021: Today - yearly follow up Patient presents today for yearly follow up of OSA and evaluation of CPAP therapy. He reports sleeping well at night, with no issues. He feels the change to a set pressure of 8 cmH2O helped and he has done well with this. Uses a nasal mask. He occasionally notices that his machine is making noise and speeding up/slowing down. His machine is 74 years old. He denies daytime fatigue symptoms, drowsy driving, or narcolepsy. DME company is [employer].  [facility] Download 03/23/2021-04/21/2021: Av. Usage: 30/30 days, >4 hr 27 days (90%) Set Pressure 8 cmH2O Leaks: 10.8 L/min, 95th - 37.2 L/min AHI 1.1  No Known Allergies Immunization History  Administered Date(s) Administered   Influenza,inj,Quad PF,6+ Mos 02/14/2017, 02/15/2018, 02/19/2019   Influenza-Unspecified 03/05/2020, 02/26/2021   PFIZER(Purple  Top)SARS-COV-2 Vaccination 07/05/2019, 07/30/2019   Pneumococcal Conjugate-13 02/14/2017   Pneumococcal Polysaccharide-23 02/15/2018   Td 03/12/2003   Tdap 02/28/2017   Zoster, Live 11/01/2012   Past Medical History:  Diagnosis Date   Allergy    Arthritis    hands   Diverticulosis of colon 09/15/2006   Dysphagia    Hiatal hernia    Nasal polyps    Nephrolithiasis    PSA per Dr. Serita Butcher   OSA (obstructive sleep apnea)     Tobacco History: Social History   Tobacco Use  Smoking Status Never  Smokeless Tobacco Never   Counseling given: Not Answered   Outpatient Medications Prior to Visit  Medication Sig Dispense Refill   amLODipine (NORVASC) 2.5 MG tablet TAKE 1 TABLET BY MOUTH ONCE A DAY 90 tablet 1   aspirin EC 81 MG tablet Take 1 tablet (81 mg total) by mouth daily. Swallow whole. 90 tablet 3   fluticasone (FLONASE) 50 MCG/ACT nasal spray PLACE 1 SPRAY INTO BOTH NOSTRILS 2 TIMESDAILY 16 g 5   multivitamin-lutein (OCUVITE-LUTEIN) CAPS capsule Take 1 capsule by mouth daily.     mupirocin ointment (BACTROBAN) 2 % Apply 1 application topically 2 (two) times daily. 30 g 2   nitroGLYCERIN (NITROSTAT) 0.4 MG SL tablet Place 1 tablet (0.4 mg total) under the tongue every 5 (five) minutes as needed for chest pain (max 3 doses). 25 tablet 3   omeprazole (PRILOSEC) 20 MG capsule TAKE 2 CAPSULES BY MOUTH DAILY 180 capsule 1   ondansetron (ZOFRAN) 4 MG tablet Take 1 tablet (4 mg total) by mouth every  8 (eight) hours as needed. 20 tablet 0   Phenylephrine HCl (SINEX REGULAR NA) Place into the nose. Use daily as needed     rosuvastatin (CRESTOR) 20 MG tablet TAKE 1 TABLET BY MOUTH ONCE A DAY 90 tablet 3   FLUAD QUADRIVALENT 0.5 ML injection  (Patient not taking: Reported on 04/22/2021)     No facility-administered medications prior to visit.     Review of Systems:   Constitutional: No weight loss or gain, night sweats, fevers, chills, fatigue, or lassitude. HEENT: No headaches,  difficulty swallowing, tooth/dental problems, or sore throat. No sneezing, itching, ear ache, nasal congestion, or post nasal drip CV:  No chest pain, orthopnea, PND, swelling in lower extremities, anasarca, dizziness, palpitations, syncope Resp: No shortness of breath with exertion or at rest. No excess mucus or change in color of mucus. No productive or non-productive. No hemoptysis. No wheezing.  No chest wall deformity GI:  No heartburn, indigestion, abdominal pain, nausea, vomiting, diarrhea, change in bowel habits, loss of appetite, bloody stools.  GU: No dysuria, change in color of urine, urgency or frequency.  No flank pain, no hematuria  Skin: No rash, lesions, ulcerations MSK:  No joint pain or swelling.  No decreased range of motion.  No back pain. Neuro: No dizziness or lightheadedness.  Psych: No depression or anxiety. Mood stable.   Observations/Objective: Interactive, pleasant. A&Ox3. Trevor Ross is present for visit.   Assessment and Plan: OSA (obstructive sleep apnea) Compliant with therapy. Continue CPAP set pressure 8 cmH2O with nasal mask. Follow up with [employer] regarding troubleshooting machine.   Patient Instructions  Continue to use CPAP every night, minimum of 4-6 hours a night.  Change equipment every 30 days or as directed by DME. Wash your tubing with warm soap and water daily, hang to dry. Wash humidifier portion weekly.  Maintain clean equipment, as directed by home health agency.  Be aware of reduced alertness and do not drive or operate heavy machinery if experiencing this or drowsiness.  Exercise encouraged, as tolerated. Healthy weight management discussed.  Avoid or decrease alcohol consumption and medications that make you more sleepy, if possible. Notify if persistent daytime sleepiness occurs even with consistent use of CPAP.   Can obtain a new machine at 5 years. Contact [employer] for machine troubleshooting in the interim.   Follow up in one year with Dr. Halford Chessman  (new consult visit) or APP. If symptoms do not improve or worsen, please contact office for sooner follow up or seek emergency care.   Follow Up Instructions: One year with Dr. Halford Chessman as new patient consult or APP. If symptoms do not improve or worsen, please contact office for sooner follow up or seek emergency care.   I discussed the assessment and treatment plan with the patient. The patient was provided an opportunity to ask questions and all were answered. The patient agreed with the plan and demonstrated an understanding of the instructions.   The patient was advised to call back or seek an in-person evaluation if the symptoms worsen or if the condition fails to improve as anticipated.  I provided 22 minutes of non-face-to-face time during this encounter.   Clayton Bibles, NP

## 2021-04-22 NOTE — Patient Instructions (Signed)
Continue to use CPAP every night, minimum of 4-6 hours a night.  Change equipment every 30 days or as directed by DME. Wash your tubing with warm soap and water daily, hang to dry. Wash humidifier portion weekly.  Maintain clean equipment, as directed by home health agency.  Be aware of reduced alertness and do not drive or operate heavy machinery if experiencing this or drowsiness.  Exercise encouraged, as tolerated. Healthy weight management discussed.  Avoid or decrease alcohol consumption and medications that make you more sleepy, if possible. Notify if persistent daytime sleepiness occurs even with consistent use of CPAP.   Can obtain a new machine at 5 years. Contact [employer] for machine troubleshooting in the interim.   Follow up in one year with Dr. Halford Chessman (new consult visit) or APP. If symptoms do not improve or worsen, please contact office for sooner follow up or seek emergency care.

## 2021-04-28 ENCOUNTER — Ambulatory Visit (INDEPENDENT_AMBULATORY_CARE_PROVIDER_SITE_OTHER): Payer: PPO | Admitting: Podiatry

## 2021-04-28 ENCOUNTER — Other Ambulatory Visit: Payer: Self-pay

## 2021-04-28 ENCOUNTER — Ambulatory Visit (INDEPENDENT_AMBULATORY_CARE_PROVIDER_SITE_OTHER): Payer: PPO

## 2021-04-28 ENCOUNTER — Encounter: Payer: Self-pay | Admitting: Podiatry

## 2021-04-28 DIAGNOSIS — M21961 Unspecified acquired deformity of right lower leg: Secondary | ICD-10-CM

## 2021-04-28 DIAGNOSIS — M205X1 Other deformities of toe(s) (acquired), right foot: Secondary | ICD-10-CM

## 2021-04-28 DIAGNOSIS — Z9889 Other specified postprocedural states: Secondary | ICD-10-CM

## 2021-04-28 NOTE — Progress Notes (Signed)
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  He denies fever chills nausea run muscle aches and pains states that he is walked barefoot in his house without his shoe or boot.  Objective: Vital signs are stable alert and oriented x3.  Pulses are palpable right foot.  He has fantastic range of motion of the first metatarsophalangeal joint with minimal dorsiflexion of the second toe at the level of the metatarsophalangeal joint most likely secondary to scar contracture.  Radiographs taken today demonstrate the contracture deformity on lateral view but the second metatarsal osteotomy and the implant are still intact with screws intact to the second metatarsal.  Assessment well-healing surgical foot.  Plan: Encourage stretching of the second metatarsal phalangeal joint demonstrating this to him.  Also encouraged range of motion exercises to the first metatarsophalangeal joint again.  He will be placed in a compression anklet today in a Darco shoe and over the next couple of weeks he will transition from a Darco shoe to his regular shoe.  I will follow-up with him in about 3 to 4 weeks for another set of x-rays.

## 2021-05-19 ENCOUNTER — Encounter: Payer: Self-pay | Admitting: Family Medicine

## 2021-05-19 DIAGNOSIS — D485 Neoplasm of uncertain behavior of skin: Secondary | ICD-10-CM | POA: Diagnosis not present

## 2021-05-19 DIAGNOSIS — L821 Other seborrheic keratosis: Secondary | ICD-10-CM | POA: Diagnosis not present

## 2021-05-19 DIAGNOSIS — L814 Other melanin hyperpigmentation: Secondary | ICD-10-CM | POA: Diagnosis not present

## 2021-05-25 NOTE — Progress Notes (Signed)
Subjective:   Trevor Ross is a 76 y.o. male who presents for Medicare Annual/Subsequent preventive examination.  I connected with Carel Carrier today by telephone and verified that I am speaking with the correct person using two identifiers. Location patient: home Location provider: work Persons participating in the virtual visit: patient, Marine scientist.    I discussed the limitations, risks, security and privacy concerns of performing an evaluation and management service by telephone and the availability of in person appointments. I also discussed with the patient that there may be a patient responsible charge related to this service. The patient expressed understanding and verbally consented to this telephonic visit.    Interactive audio and video telecommunications were attempted between this provider and patient, however failed, due to patient having technical difficulties OR patient did not have access to video capability.  We continued and completed visit with audio only.  Some vital signs may be absent or patient reported.   Time Spent with patient on telephone encounter: 20 minutes  Review of Systems     Cardiac Risk Factors include: advanced age (>25men, >56 women)     Objective:    Today's Vitals   05/26/21 1135  Weight: 194 lb (88 kg)  Height: 5\' 11"  (1.803 m)  PainSc: 5    Body mass index is 27.06 kg/m.  Advanced Directives 05/26/2021 04/14/2020 11/14/2019 02/08/2018 02/07/2017 08/20/2012 08/10/2012  Does Patient Have a Medical Advance Directive? Yes No No No;Yes Yes - Patient has advance directive, copy not in chart  Type of Advance Directive [place];Living will - - [place];Living will [place];Living will - [place];Living will  Does patient want to make changes to medical advance directive? Yes (MAU/Ambulatory/Procedural Areas - Information given) - - No - Patient declined - - -  Copy of  [place] in Chart? - - - No - copy requested No - copy requested - Copy requested from family  Would patient like information on creating a medical advance directive? - - No - Patient declined No - Patient declined - - -  Pre-existing out of facility DNR order (yellow form or pink MOST form) - - - - - No -    Current Medications (verified) Outpatient Encounter Medications as of 05/26/2021  Medication Sig   amLODipine (NORVASC) 2.5 MG tablet TAKE 1 TABLET BY MOUTH ONCE A DAY   aspirin EC 81 MG tablet Take 1 tablet (81 mg total) by mouth daily. Swallow whole.   fluticasone (FLONASE) 50 MCG/ACT nasal spray PLACE 1 SPRAY INTO BOTH NOSTRILS 2 TIMESDAILY   multivitamin-lutein (OCUVITE-LUTEIN) CAPS capsule Take 1 capsule by mouth daily.   mupirocin ointment (BACTROBAN) 2 % Apply 1 application topically 2 (two) times daily.   nitroGLYCERIN (NITROSTAT) 0.4 MG SL tablet Place 1 tablet (0.4 mg total) under the tongue every 5 (five) minutes as needed for chest pain (max 3 doses).   omeprazole (PRILOSEC) 20 MG capsule TAKE 2 CAPSULES BY MOUTH DAILY   ondansetron (ZOFRAN) 4 MG tablet Take 1 tablet (4 mg total) by mouth every 8 (eight) hours as needed.   Phenylephrine HCl (SINEX REGULAR NA) Place into the nose. Use daily as needed   rosuvastatin (CRESTOR) 20 MG tablet TAKE 1 TABLET BY MOUTH ONCE A DAY   FLUAD QUADRIVALENT 0.5 ML injection  (Patient not taking: Reported on 04/22/2021)   No facility-administered encounter medications on file as of 05/26/2021.    Allergies (verified) Patient  has no known allergies.   History: Past Medical History:  Diagnosis Date   Allergy    Arthritis    hands   Diverticulosis of colon 09/15/2006   Dysphagia    Hiatal hernia    Nasal polyps    Nephrolithiasis    PSA per Dr. Serita Butcher   OSA (obstructive sleep apnea)    Past Surgical History:  Procedure Laterality Date   APPENDECTOMY  1988   CARPAL TUNNEL RELEASE Left    CATARACT EXTRACTION  Bilateral    Left knee repair  1970's   LITHOTRIPSY     twice per pt   right knee surgery     arthroscopic   Sinus surgery for polyps     Family History  Problem Relation Age of Onset   Heart disease Mother    Prostate cancer Father    Colon cancer Neg Hx    Social History   Socioeconomic History   Marital status: Married    Spouse name: Not on file   Number of children: 0   Years of education: Not on file   Highest education level: Not on file  Occupational History   Occupation: Insurance account manager  Tobacco Use   Smoking status: Never   Smokeless tobacco: Never  Vaping Use   Vaping Use: Never used  Substance and Sexual Activity   Alcohol use: Yes    Alcohol/week: 0.0 standard drinks    Comment: rare   Drug use: No   Sexual activity: Not on file  Other Topics Concern   Not on file  Social History Narrative   1 step-daughter, 1 grandson   Married 1980   Social Determinants of Radio broadcast assistant Strain: Low Risk    Difficulty of Paying Living Expenses: Not hard at all  Food Insecurity: No Food Insecurity   Worried About Charity fundraiser in the Last Year: Never true   Arboriculturist in the Last Year: Never true  Transportation Needs: No Transportation Needs   Lack of Transportation (Medical): No   Lack of Transportation (Non-Medical): No  Physical Activity: Inactive   Days of Exercise per Week: 0 days   Minutes of Exercise per Session: 0 min  Stress: No Stress Concern Present   Feeling of Stress : Not at all  Social Connections: Moderately Isolated   Frequency of Communication with Friends and Family: Twice a week   Frequency of Social Gatherings with Friends and Family: Twice a week   Attends Religious Services: Never   Marine scientist or Organizations: No   Attends Music therapist: Never   Marital Status: Married    Tobacco Counseling Counseling given: Not Answered   Clinical Intake:  Pre-visit preparation completed:  Yes  Pain : 0-10 Pain Score: 5  Pain Location: Foot Pain Orientation: Right     BMI - recorded: 27.06 Nutritional Status: BMI 25 -29 Overweight Nutritional Risks: None Diabetes: No  How often do you need to have someone help you when you read instructions, pamphlets, or other written materials from your doctor or pharmacy?: 1 - Never  Diabetic? No  Interpreter Needed?: No  Information entered by :: Orrin Brigham LPN   Activities of Daily Living In your present state of health, do you have any difficulty performing the following activities: 05/26/2021  Hearing? N  Vision? N  Difficulty concentrating or making decisions? N  Walking or climbing stairs? N  Dressing or bathing? N  Doing errands, shopping?  N  Preparing Food and eating ? N  Using the Toilet? N  In the past six months, have you accidently leaked urine? N  Do you have problems with loss of bowel control? N  Managing your Medications? N  Managing your Finances? N  Housekeeping or managing your Housekeeping? N  Some recent data might be hidden    Patient Care Team: Tonia Ghent, MD as PCP - General (Family Medicine) Buford Dresser, MD as PCP - Cardiology (Cardiology)  Indicate any recent Medical Services you may have received from other than [facility] providers in the past year (date may be approximate).     Assessment:   This is a routine wellness examination for Cara.  Hearing/Vision screen Hearing Screening - Comments:: No issues Vision Screening - Comments:: Last exam 09/30/2020, Dr. Prudencio Burly   Dietary issues and exercise activities discussed: Current Exercise Habits: The patient does not participate in regular exercise at present, Exercise limited by: orthopedic condition(s)   Goals Addressed             This Visit's Progress    Patient Stated       Would like to eat healthier       Depression Screen PHQ 2/9 Scores 05/26/2021 04/21/2020 02/19/2019 02/08/2018 02/07/2017  PHQ - 2 Score  0 0 0 0 0  PHQ- 9 Score - - - 0 0    Fall Risk Fall Risk  05/26/2021 04/21/2020 02/19/2019 02/08/2018 02/07/2017  Falls in the past year? 0 0 0 No No  Number falls in past yr: 0 0 0 - -  Injury with Fall? 0 0 - - -  Risk for fall due to : No Fall Risks - - - -  Follow up Falls prevention discussed Falls evaluation completed - - -    FALL RISK PREVENTION PERTAINING TO THE HOME:  Any stairs in or around the home? Yes  If so, are there any without handrails? No  Home free of loose throw rugs in walkways, pet beds, electrical cords, etc? Yes  Adequate lighting in your home to reduce risk of falls? Yes   ASSISTIVE DEVICES UTILIZED TO PREVENT FALLS:  Life alert? No  Use of a cane, walker or w/c? No  Grab bars in the bathroom? No  Shower chair or bench in shower? No  Elevated toilet seat or a handicapped toilet? No   TIMED UP AND GO:  Was the test performed? No .    Cognitive Function: Normal cognitive status assessed by  this Nurse Health Advisor. No abnormalities found.   MMSE - Mini Mental State Exam 02/08/2018 02/07/2017  Orientation to time 5 5  Orientation to Place 5 5  Registration 3 3  Attention/ Calculation 0 0  Recall 3 3  Language- name 2 objects 0 0  Language- repeat 1 1  Language- follow 3 step command 3 3  Language- read & follow direction 0 0  Write a sentence 0 0  Copy design 0 0  Total score 20 20        Immunizations Immunization History  Administered Date(s) Administered   Influenza,inj,Quad PF,6+ Mos 02/14/2017, 02/15/2018, 02/19/2019   Influenza-Unspecified 03/05/2020, 02/26/2021   PFIZER(Purple Top)SARS-COV-2 Vaccination 07/05/2019, 07/30/2019   Pneumococcal Conjugate-13 02/14/2017   Pneumococcal Polysaccharide-23 02/15/2018   Td 03/12/2003   Tdap 02/28/2017   Zoster, Live 11/01/2012    TDAP status: Up to date  Flu Vaccine status: Up to date  Pneumococcal vaccine status: Up to date  Covid-19 vaccine status:  Information provided on how  to obtain vaccines.   Qualifies for Shingles Vaccine? Yes   Zostavax completed Yes   Shingrix Completed?: No.    Education has been provided regarding the importance of this vaccine. Patient has been advised to call insurance company to determine out of pocket expense if they have not yet received this vaccine. Advised may also receive vaccine at local pharmacy or Health Dept. Verbalized acceptance and understanding.  Screening Tests Health Maintenance  Topic Date Due   Zoster Vaccines- Shingrix (1 of 2) Never done   COVID-19 Vaccine (3 - Pfizer risk series) 08/27/2019   COLONOSCOPY (Pts 45-50yrs Insurance coverage will need to be confirmed)  02/10/2022   TETANUS/TDAP  03/01/2027   Pneumonia Vaccine 71+ Years old  Completed   INFLUENZA VACCINE  Completed   Hepatitis C Screening  Completed   HPV VACCINES  Aged Out    Health Maintenance  Health Maintenance Due  Topic Date Due   Zoster Vaccines- Shingrix (1 of 2) Never done   COVID-19 Vaccine (3 - Pfizer risk series) 08/27/2019    Colorectal cancer screening: Type of screening: Colonoscopy. Completed 02/10/17. Repeat every 5 years  Lung Cancer Screening: (Low Dose CT Chest recommended if Age 67-80 years, 30 pack-year currently smoking OR have quit w/in 15years.) does not qualify.     Additional Screening:  Hepatitis C Screening: does qualify; Completed 02/07/17  Vision Screening: Recommended annual ophthalmology exams for early detection of glaucoma and other disorders of the eye. Is the patient up to date with their annual eye exam?  Yes  Who is the provider or what is the name of the office in which the patient attends annual eye exams? Dr. Prudencio Burly   Dental Screening: Recommended annual dental exams for proper oral hygiene  Community Resource Referral / Chronic Care Management: CRR required this visit?  No   CCM required this visit?  No      Plan:     I have personally reviewed and noted the following in the patients  chart:   Medical and social history Use of alcohol, tobacco or illicit drugs  Current medications and supplements including opioid prescriptions. Patient is not currently taking opioid prescriptions. Functional ability and status Nutritional status Physical activity Advanced directives List of other physicians Hospitalizations, surgeries, and ER visits in previous 12 months Vitals Screenings to include cognitive, depression, and falls Referrals and appointments  In addition, I have reviewed and discussed with patient certain preventive protocols, quality metrics, and best practice recommendations. A written personalized care plan for preventive services as well as general preventive health recommendations were provided to patient.   Due to this being a telephonic visit, the after visit summary with patients personalized plan was offered to patient via mail or my-chart. Patient would like to access on my-chart.     Loma Messing, LPN   05/29/6242   Nurse Myrle Sheng Advisor  Nurse Notes: none

## 2021-05-26 ENCOUNTER — Other Ambulatory Visit: Payer: Self-pay

## 2021-05-26 ENCOUNTER — Ambulatory Visit (INDEPENDENT_AMBULATORY_CARE_PROVIDER_SITE_OTHER): Payer: PPO | Admitting: Podiatry

## 2021-05-26 ENCOUNTER — Ambulatory Visit (INDEPENDENT_AMBULATORY_CARE_PROVIDER_SITE_OTHER): Payer: PPO

## 2021-05-26 VITALS — Ht 71.0 in | Wt 194.0 lb

## 2021-05-26 DIAGNOSIS — Z0000 Encounter for general adult medical examination without abnormal findings: Secondary | ICD-10-CM | POA: Diagnosis not present

## 2021-05-26 DIAGNOSIS — M205X1 Other deformities of toe(s) (acquired), right foot: Secondary | ICD-10-CM

## 2021-05-26 DIAGNOSIS — Z9889 Other specified postprocedural states: Secondary | ICD-10-CM

## 2021-05-26 DIAGNOSIS — M21961 Unspecified acquired deformity of right lower leg: Secondary | ICD-10-CM

## 2021-05-26 NOTE — Progress Notes (Signed)
Presents today for follow-up of his Trevor Ross arthroplasty and second metatarsal osteotomy that was performed on March 19, 2021 states that he is just about ready to have his left knee replaced which she is having done on 06/21/2021.  Objective: Vital signs are stable he is alert oriented x3 has still swelling around the first metatarsal great range of motion of the first metatarsophalangeal joint and of the second toe.  Radiographs taken today demonstrate well-healing osteotomy of the second toe the single silicone implant is still in place with grommets.  Assessment: Well-healing surgical foot right.  Plan: I will follow-up with him in 1 more month after he has completed his physical therapy for his knee replacement.

## 2021-05-26 NOTE — Patient Instructions (Signed)
Trevor Ross , Thank you for taking time to come for your Medicare Wellness Visit. I appreciate your ongoing commitment to your health goals. Please review the following plan we discussed and let me know if I can assist you in the future.   Screening recommendations/referrals: Colonoscopy: up to date, completed 02/10/17, due 02/10/22 Recommended yearly ophthalmology/optometry visit for glaucoma screening and checkup Recommended yearly dental visit for hygiene and checkup  Vaccinations: Influenza vaccine: up to date  Pneumococcal vaccine: up to date  Tdap vaccine: up to date, completed 02/28/17 Shingles vaccine: Discuss with your local pharmacy   Covid-19: newest booster available at your local pharmacy  Advanced directives: Please bring a copy of Living Will and/or Healthcare Power of Attorney for your chart.   Conditions/risks identified: see problem list  Next appointment: Follow up in one year for your annual wellness visit. 05/27/22 @ 2:45pm, this will be a telephone visit  Preventive Care 11 Years and Older, Male Preventive care refers to lifestyle choices and visits with your health care provider that can promote health and wellness. What does preventive care include? A yearly physical exam. This is also called an annual well check. Dental exams once or twice a year. Routine eye exams. Ask your health care provider how often you should have your eyes checked. Personal lifestyle choices, including: Daily care of your teeth and gums. Regular physical activity. Eating a healthy diet. Avoiding tobacco and drug use. Limiting alcohol use. Practicing safe sex. Taking low doses of aspirin every day. Taking vitamin and mineral supplements as recommended by your health care provider. What happens during an annual well check? The services and screenings done by your health care provider during your annual well check will depend on your age, overall health, lifestyle risk factors, and  family history of disease. Counseling  Your health care provider may ask you questions about your: Alcohol use. Tobacco use. Drug use. Emotional well-being. Home and relationship well-being. Sexual activity. Eating habits. History of falls. Memory and ability to understand (cognition). Work and work Statistician. Screening  You may have the following tests or measurements: Height, weight, and BMI. Blood pressure. Lipid and cholesterol levels. These may be checked every 5 years, or more frequently if you are over 34 years old. Skin check. Lung cancer screening. You may have this screening every year starting at age 43 if you have a 30-pack-year history of smoking and currently smoke or have quit within the past 15 years. Fecal occult blood test (FOBT) of the stool. You may have this test every year starting at age 2. Flexible sigmoidoscopy or colonoscopy. You may have a sigmoidoscopy every 5 years or a colonoscopy every 10 years starting at age 64. Prostate cancer screening. Recommendations will vary depending on your family history and other risks. Hepatitis C blood test. Hepatitis B blood test. Sexually transmitted disease (STD) testing. Diabetes screening. This is done by checking your blood sugar (glucose) after you have not eaten for a while (fasting). You may have this done every 1-3 years. Abdominal aortic aneurysm (AAA) screening. You may need this if you are a current or former smoker. Osteoporosis. You may be screened starting at age 44 if you are at high risk. Talk with your health care provider about your test results, treatment options, and if necessary, the need for more tests. Vaccines  Your health care provider may recommend certain vaccines, such as: Influenza vaccine. This is recommended every year. Tetanus, diphtheria, and acellular pertussis (Tdap, Td) vaccine. You may  need a Td booster every 10 years. Zoster vaccine. You may need this after age 10. Pneumococcal  13-valent conjugate (PCV13) vaccine. One dose is recommended after age 59. Pneumococcal polysaccharide (PPSV23) vaccine. One dose is recommended after age 54. Talk to your health care provider about which screenings and vaccines you need and how often you need them. This information is not intended to replace advice given to you by your health care provider. Make sure you discuss any questions you have with your health care provider. Document Released: 05/22/2015 Document Revised: 01/13/2016 Document Reviewed: 02/24/2015 Elsevier Interactive Patient Education  2017 [place] Prevention in the Home Falls can cause injuries. They can happen to people of all ages. There are many things you can do to make your home safe and to help prevent falls. What can I do on the outside of my home? Regularly fix the edges of walkways and driveways and fix any cracks. Remove anything that might make you trip as you walk through a door, such as a raised step or threshold. Trim any bushes or trees on the path to your home. Use bright outdoor lighting. Clear any walking paths of anything that might make someone trip, such as rocks or tools. Regularly check to see if handrails are loose or broken. Make sure that both sides of any steps have handrails. Any raised decks and porches should have guardrails on the edges. Have any leaves, snow, or ice cleared regularly. Use sand or salt on walking paths during winter. Clean up any spills in your garage right away. This includes oil or grease spills. What can I do in the bathroom? Use night lights. Install grab bars by the toilet and in the tub and shower. Do not use towel bars as grab bars. Use non-skid mats or decals in the tub or shower. If you need to sit down in the shower, use a plastic, non-slip stool. Keep the floor dry. Clean up any water that spills on the floor as soon as it happens. Remove soap buildup in the tub or shower regularly. Attach  bath mats securely with double-sided non-slip rug tape. Do not have throw rugs and other things on the floor that can make you trip. What can I do in the bedroom? Use night lights. Make sure that you have a light by your bed that is easy to reach. Do not use any sheets or blankets that are too big for your bed. They should not hang down onto the floor. Have a firm chair that has side arms. You can use this for support while you get dressed. Do not have throw rugs and other things on the floor that can make you trip. What can I do in the kitchen? Clean up any spills right away. Avoid walking on wet floors. Keep items that you use a lot in easy-to-reach places. If you need to reach something above you, use a strong step stool that has a grab bar. Keep electrical cords out of the way. Do not use floor polish or wax that makes floors slippery. If you must use wax, use non-skid floor wax. Do not have throw rugs and other things on the floor that can make you trip. What can I do with my stairs? Do not leave any items on the stairs. Make sure that there are handrails on both sides of the stairs and use them. Fix handrails that are broken or loose. Make sure that handrails are as long as the stairways.  Check any carpeting to make sure that it is firmly attached to the stairs. Fix any carpet that is loose or worn. Avoid having throw rugs at the top or bottom of the stairs. If you do have throw rugs, attach them to the floor with carpet tape. Make sure that you have a light switch at the top of the stairs and the bottom of the stairs. If you do not have them, ask someone to add them for you. What else can I do to help prevent falls? Wear shoes that: Do not have high heels. Have rubber bottoms. Are comfortable and fit you well. Are closed at the toe. Do not wear sandals. If you use a stepladder: Make sure that it is fully opened. Do not climb a closed stepladder. Make sure that both sides of the  stepladder are locked into place. Ask someone to hold it for you, if possible. Clearly mark and make sure that you can see: Any grab bars or handrails. First and last steps. Where the edge of each step is. Use tools that help you move around (mobility aids) if they are needed. These include: Canes. Walkers. Scooters. Crutches. Turn on the lights when you go into a dark area. Replace any light bulbs as soon as they burn out. Set up your furniture so you have a clear path. Avoid moving your furniture around. If any of your floors are uneven, fix them. If there are any pets around you, be aware of where they are. Review your medicines with your doctor. Some medicines can make you feel dizzy. This can increase your chance of falling. Ask your doctor what other things that you can do to help prevent falls. This information is not intended to replace advice given to you by your health care provider. Make sure you discuss any questions you have with your health care provider. Document Released: 02/19/2009 Document Revised: 10/01/2015 Document Reviewed: 05/30/2014 Elsevier Interactive Patient Education  2017 [employer].

## 2021-05-27 DIAGNOSIS — M1712 Unilateral primary osteoarthritis, left knee: Secondary | ICD-10-CM | POA: Diagnosis not present

## 2021-06-21 ENCOUNTER — Other Ambulatory Visit: Payer: Self-pay | Admitting: Family Medicine

## 2021-06-21 DIAGNOSIS — Z96652 Presence of left artificial knee joint: Secondary | ICD-10-CM | POA: Diagnosis not present

## 2021-06-21 DIAGNOSIS — M1712 Unilateral primary osteoarthritis, left knee: Secondary | ICD-10-CM | POA: Diagnosis not present

## 2021-06-21 DIAGNOSIS — G8918 Other acute postprocedural pain: Secondary | ICD-10-CM | POA: Diagnosis not present

## 2021-06-30 ENCOUNTER — Other Ambulatory Visit: Payer: Self-pay | Admitting: Family Medicine

## 2021-07-01 DIAGNOSIS — Z96652 Presence of left artificial knee joint: Secondary | ICD-10-CM | POA: Diagnosis not present

## 2021-07-02 DIAGNOSIS — Z96652 Presence of left artificial knee joint: Secondary | ICD-10-CM | POA: Diagnosis not present

## 2021-07-02 DIAGNOSIS — M25562 Pain in left knee: Secondary | ICD-10-CM | POA: Diagnosis not present

## 2021-07-05 DIAGNOSIS — Z96652 Presence of left artificial knee joint: Secondary | ICD-10-CM | POA: Diagnosis not present

## 2021-07-05 DIAGNOSIS — M25562 Pain in left knee: Secondary | ICD-10-CM | POA: Diagnosis not present

## 2021-07-09 DIAGNOSIS — M25562 Pain in left knee: Secondary | ICD-10-CM | POA: Diagnosis not present

## 2021-07-09 DIAGNOSIS — Z96652 Presence of left artificial knee joint: Secondary | ICD-10-CM | POA: Diagnosis not present

## 2021-07-12 DIAGNOSIS — M25562 Pain in left knee: Secondary | ICD-10-CM | POA: Diagnosis not present

## 2021-07-12 DIAGNOSIS — Z96652 Presence of left artificial knee joint: Secondary | ICD-10-CM | POA: Diagnosis not present

## 2021-07-13 ENCOUNTER — Telehealth: Payer: Self-pay | Admitting: Primary Care

## 2021-07-13 NOTE — Telephone Encounter (Signed)
Hey I called this patient and he states that he never received his CPAP machine. Can we look into this for this patient.  ? ?His DME is [facility]!! ? ?Thank you  ?

## 2021-07-13 NOTE — Telephone Encounter (Signed)
Well the way I see it there was no Cpap order placed after the visit in 04/2021 ?

## 2021-07-13 NOTE — Telephone Encounter (Signed)
He had an office visit with Derl Barrow NP on 06/12/19 and the CPAP order was placed then.  ? ?Does he need a new CPAP order at this point? ? ?Im just confused as to why he reached out now to tell us he had no CPAP. But Joellen [place] said he has a Banker and she stated he could get a new machine in 5 years. Can we clarify if he has a machine or not.  ? ?Please and thank you  ?

## 2021-07-14 DIAGNOSIS — Z96652 Presence of left artificial knee joint: Secondary | ICD-10-CM | POA: Diagnosis not present

## 2021-07-14 DIAGNOSIS — M25562 Pain in left knee: Secondary | ICD-10-CM | POA: Diagnosis not present

## 2021-07-16 NOTE — Telephone Encounter (Signed)
Trevor Ross - will you please help with this patient?  ?

## 2021-07-16 NOTE — Telephone Encounter (Signed)
I have sent a message to [facility] to see what is going on ?

## 2021-07-16 NOTE — Telephone Encounter (Signed)
Brad sent a message to [facility] to contact the patient to discuss the issue and see what we can do for him.   ?Note from RT Team** Called pt and he have appointment today at '@3'$ :00 for a trouble shoot   ? ?

## 2021-07-16 NOTE — Telephone Encounter (Signed)
Please see Leory Plowman from [facility] response below ? ?New, Madilyn Fireman, Demetra Shiner, [place]; [place], Ward Chatters ?Hello Rodena Piety,  ? ?Yes, Patient has a Cpap from [place]. He has an AirSence 10 autoset that was received on 03/16/2017 ( less than 5 years).  ? ?I do not see any recent orders in the [facility] orders. Last order I show is from 06-12-19 for a settings change that was received and process per notes. Please advise if anything else is needed.  ? ?Thank you,  ? ?Brad New     ?  ?   ? ?

## 2021-07-16 NOTE — Telephone Encounter (Signed)
I spoke with Trevor Ross and she explained to me that the reason they did the video visit back on 04/22/21 was because his Cpap machine was not working properly.  An order should have been place to have [employer] check out his cpap to see if it could be fixed or replaced because the patient is only using half the night. I have sent another message to [employer] to see what their suggestions are for this  issue ?

## 2021-07-19 NOTE — Telephone Encounter (Signed)
Noted. ?Will close encounter,  ?

## 2021-07-20 DIAGNOSIS — Z96652 Presence of left artificial knee joint: Secondary | ICD-10-CM | POA: Diagnosis not present

## 2021-07-20 DIAGNOSIS — M25562 Pain in left knee: Secondary | ICD-10-CM | POA: Diagnosis not present

## 2021-07-23 DIAGNOSIS — Z96652 Presence of left artificial knee joint: Secondary | ICD-10-CM | POA: Diagnosis not present

## 2021-07-23 DIAGNOSIS — M25562 Pain in left knee: Secondary | ICD-10-CM | POA: Diagnosis not present

## 2021-07-26 DIAGNOSIS — M25562 Pain in left knee: Secondary | ICD-10-CM | POA: Diagnosis not present

## 2021-07-26 DIAGNOSIS — Z96652 Presence of left artificial knee joint: Secondary | ICD-10-CM | POA: Diagnosis not present

## 2021-07-28 DIAGNOSIS — Z96652 Presence of left artificial knee joint: Secondary | ICD-10-CM | POA: Diagnosis not present

## 2021-07-28 DIAGNOSIS — M25562 Pain in left knee: Secondary | ICD-10-CM | POA: Diagnosis not present

## 2021-08-03 DIAGNOSIS — M25562 Pain in left knee: Secondary | ICD-10-CM | POA: Diagnosis not present

## 2021-08-03 DIAGNOSIS — Z96652 Presence of left artificial knee joint: Secondary | ICD-10-CM | POA: Diagnosis not present

## 2021-08-06 DIAGNOSIS — Z96652 Presence of left artificial knee joint: Secondary | ICD-10-CM | POA: Diagnosis not present

## 2021-08-06 DIAGNOSIS — M25562 Pain in left knee: Secondary | ICD-10-CM | POA: Diagnosis not present

## 2021-08-10 DIAGNOSIS — M25562 Pain in left knee: Secondary | ICD-10-CM | POA: Diagnosis not present

## 2021-08-10 DIAGNOSIS — Z96652 Presence of left artificial knee joint: Secondary | ICD-10-CM | POA: Diagnosis not present

## 2021-08-12 DIAGNOSIS — Z96652 Presence of left artificial knee joint: Secondary | ICD-10-CM | POA: Diagnosis not present

## 2021-08-12 DIAGNOSIS — M25562 Pain in left knee: Secondary | ICD-10-CM | POA: Diagnosis not present

## 2021-08-18 DIAGNOSIS — M25562 Pain in left knee: Secondary | ICD-10-CM | POA: Diagnosis not present

## 2021-08-18 DIAGNOSIS — Z96652 Presence of left artificial knee joint: Secondary | ICD-10-CM | POA: Diagnosis not present

## 2021-08-20 DIAGNOSIS — Z96652 Presence of left artificial knee joint: Secondary | ICD-10-CM | POA: Diagnosis not present

## 2021-08-20 DIAGNOSIS — M25562 Pain in left knee: Secondary | ICD-10-CM | POA: Diagnosis not present

## 2021-08-23 DIAGNOSIS — M25562 Pain in left knee: Secondary | ICD-10-CM | POA: Diagnosis not present

## 2021-08-23 DIAGNOSIS — Z96652 Presence of left artificial knee joint: Secondary | ICD-10-CM | POA: Diagnosis not present

## 2021-08-25 DIAGNOSIS — Z96652 Presence of left artificial knee joint: Secondary | ICD-10-CM | POA: Diagnosis not present

## 2021-08-25 DIAGNOSIS — M25562 Pain in left knee: Secondary | ICD-10-CM | POA: Diagnosis not present

## 2021-08-31 DIAGNOSIS — M25562 Pain in left knee: Secondary | ICD-10-CM | POA: Diagnosis not present

## 2021-08-31 DIAGNOSIS — Z96652 Presence of left artificial knee joint: Secondary | ICD-10-CM | POA: Diagnosis not present

## 2021-09-08 DIAGNOSIS — M25562 Pain in left knee: Secondary | ICD-10-CM | POA: Diagnosis not present

## 2021-09-08 DIAGNOSIS — Z96652 Presence of left artificial knee joint: Secondary | ICD-10-CM | POA: Diagnosis not present

## 2021-09-15 DIAGNOSIS — Z96652 Presence of left artificial knee joint: Secondary | ICD-10-CM | POA: Diagnosis not present

## 2021-09-15 DIAGNOSIS — M25562 Pain in left knee: Secondary | ICD-10-CM | POA: Diagnosis not present

## 2021-09-20 ENCOUNTER — Other Ambulatory Visit: Payer: Self-pay | Admitting: Family Medicine

## 2021-09-22 DIAGNOSIS — M25562 Pain in left knee: Secondary | ICD-10-CM | POA: Diagnosis not present

## 2021-09-22 DIAGNOSIS — Z96652 Presence of left artificial knee joint: Secondary | ICD-10-CM | POA: Diagnosis not present

## 2021-09-27 ENCOUNTER — Encounter: Payer: Self-pay | Admitting: Podiatry

## 2021-09-27 ENCOUNTER — Ambulatory Visit (INDEPENDENT_AMBULATORY_CARE_PROVIDER_SITE_OTHER): Payer: PPO

## 2021-09-27 ENCOUNTER — Ambulatory Visit (INDEPENDENT_AMBULATORY_CARE_PROVIDER_SITE_OTHER): Payer: PPO | Admitting: Podiatry

## 2021-09-27 DIAGNOSIS — I872 Venous insufficiency (chronic) (peripheral): Secondary | ICD-10-CM | POA: Diagnosis not present

## 2021-09-27 DIAGNOSIS — Z9889 Other specified postprocedural states: Secondary | ICD-10-CM | POA: Diagnosis not present

## 2021-09-27 DIAGNOSIS — M205X1 Other deformities of toe(s) (acquired), right foot: Secondary | ICD-10-CM

## 2021-09-27 DIAGNOSIS — M21961 Unspecified acquired deformity of right lower leg: Secondary | ICD-10-CM

## 2021-09-27 NOTE — Progress Notes (Signed)
He presents today after having surgery last year stating that he is doing quite well with the surgery itself he is just swelling in his right foot and as well as his left foot.  Has had knee replacement left.  States that as he walks he feels full behind his knees particularly the right 1 seems to be worse than the left 1.  He states that he just worsens throughout the day and evening because of swelling into his foot.  Objective: Vital signs are stable he is alert and oriented x3 the foot appears to be perfectly normal great range of motion no reproducible pain.  He does have swelling in his right calf as well as his left is more than the right no calf pain on medial-lateral compression.  There is no warmth on palpation.  Assessment venous insufficiency most likely bilateral lower extremities well-healed surgical foot right.  Plan: We will request a venous insufficiency study.

## 2021-09-29 ENCOUNTER — Telehealth (HOSPITAL_BASED_OUTPATIENT_CLINIC_OR_DEPARTMENT_OTHER): Payer: Self-pay | Admitting: Cardiology

## 2021-09-29 NOTE — Telephone Encounter (Signed)
RN returned call to patient and his wife. Patients wife states that the patient went to Dr. Milinda Pointer on Monday and he told the dr. About his total right knee replacement and foot surgery last year. Patient having pain behind the Knees. They told the patient he may need some kind of vascular testing on his legs. Patient wanted to get Dr. Judeth Cornfield opinion  Foot swelling, both knees swollen, they get tight when he walks. Also hurts down the front of his calfs. When he pulls off his socks at night he has rings from his socks. No discoloration or clots systems.

## 2021-09-29 NOTE — Telephone Encounter (Signed)
New Message:      Patient's wife would like for Alisha to please give her a call. Patient went to the foot doctor the other day and ws told he needs an  Vascular MRI.

## 2021-09-30 NOTE — Telephone Encounter (Signed)
He has had normal pulses in the past--if this is new he should be examined either by [place] or primary care to determine what study would be appropriate to order.

## 2021-10-08 DIAGNOSIS — Z961 Presence of intraocular lens: Secondary | ICD-10-CM | POA: Diagnosis not present

## 2021-10-08 DIAGNOSIS — H26493 Other secondary cataract, bilateral: Secondary | ICD-10-CM | POA: Diagnosis not present

## 2021-12-28 ENCOUNTER — Telehealth (HOSPITAL_BASED_OUTPATIENT_CLINIC_OR_DEPARTMENT_OTHER): Payer: Self-pay

## 2021-12-28 MED ORDER — ROSUVASTATIN CALCIUM 20 MG PO TABS: 20.0000 mg | ORAL_TABLET | Freq: Every day | ORAL | 0 refills | Status: DC

## 2021-12-28 NOTE — Telephone Encounter (Signed)
Received fax from [facility] requesting refills for Rosuvastatin. Rx request sent to pharmacy for 90 tab with 0 refills. Pt needs to schedule annual follow-up with Dr. Harrell Gave.

## 2022-01-19 ENCOUNTER — Other Ambulatory Visit: Payer: Self-pay | Admitting: Family Medicine

## 2022-01-19 DIAGNOSIS — R0981 Nasal congestion: Secondary | ICD-10-CM

## 2022-01-19 NOTE — Telephone Encounter (Signed)
Refill request for Flonase  LOV - 10/19/20 Next OV - not scheduled Last refill - 11/11/20 #16 g/5

## 2022-01-19 NOTE — Telephone Encounter (Signed)
Patient is overdue for his medicare wellness visit. Please call to schedule appt

## 2022-01-19 NOTE — Telephone Encounter (Signed)
LVM for patient to schedule a wellness visit.

## 2022-01-20 ENCOUNTER — Encounter (HOSPITAL_BASED_OUTPATIENT_CLINIC_OR_DEPARTMENT_OTHER): Payer: Self-pay | Admitting: Cardiology

## 2022-01-20 ENCOUNTER — Telehealth (INDEPENDENT_AMBULATORY_CARE_PROVIDER_SITE_OTHER): Payer: PPO | Admitting: Cardiology

## 2022-01-20 VITALS — BP 137/69 | HR 65 | Ht 71.0 in | Wt 190.0 lb

## 2022-01-20 DIAGNOSIS — I872 Venous insufficiency (chronic) (peripheral): Secondary | ICD-10-CM | POA: Diagnosis not present

## 2022-01-20 DIAGNOSIS — I119 Hypertensive heart disease without heart failure: Secondary | ICD-10-CM

## 2022-01-20 DIAGNOSIS — E7800 Pure hypercholesterolemia, unspecified: Secondary | ICD-10-CM

## 2022-01-20 DIAGNOSIS — I2584 Coronary atherosclerosis due to calcified coronary lesion: Secondary | ICD-10-CM

## 2022-01-20 DIAGNOSIS — I2510 Atherosclerotic heart disease of native coronary artery without angina pectoris: Secondary | ICD-10-CM

## 2022-01-20 DIAGNOSIS — I10 Essential (primary) hypertension: Secondary | ICD-10-CM

## 2022-01-20 NOTE — Progress Notes (Unsigned)
Virtual Visit via Video Note   Because of Trevor Ross's co-morbid illnesses, he is at least at moderate risk for complications without adequate follow up.  This format is felt to be most appropriate for this patient at this time.  All issues noted in this document were discussed and addressed.  A limited physical exam was performed with this format.  Please refer to the patient's chart for his consent to telehealth for [facility].  Video Connection Lost Video connection was lost at > 50% of the duration of this visit, at which time the remainder of the visit was completed via audio only.     The patient was identified using 2 identifiers.  Patient Location: Home Provider Location: Office/Clinic   Date:  01/20/2022   ID:  Trevor Ross, DOB 1945/12/15, MRN [id number]  PCP:  Tonia Ghent, MD  Cardiologist:  Buford Dresser, MD  Referring MD: Tonia Ghent, MD   CC: follow up  History of Present Illness:    Trevor Ross is a 76 y.o. male with a hx of arthritis, vascular calcification who is seen for follow up today. I initially met him 01/08/20 as a new consult at the request of Tonia Ghent, MD for the evaluation and management of possible coronary artery disease and chest pain.  Today: Continues to have LE edema, R>L. Reviewed prior note from Dr. Milinda Pointer in podiatry. Thought to be chronic venous insufficiency. Has venous reflux study ordered, not performed. Reviewed recommendations for compression, elevation.  Rare arm/chest pain, only with lifting and primarily in his shoulder. Consistent with his known chronic arthritis in his shoulder.  Denies shortness of breath at rest or with normal exertion. No PND, orthopnea, or unexpected weight gain. No syncope or palpitations.   Past Medical History:  Diagnosis Date   Allergy    Arthritis    hands   Diverticulosis of colon 09/15/2006   Dysphagia    Hiatal hernia    Nasal polyps    Nephrolithiasis     PSA per Dr. Serita Butcher   OSA (obstructive sleep apnea)     Past Surgical History:  Procedure Laterality Date   APPENDECTOMY  1988   CARPAL TUNNEL RELEASE Left    CATARACT EXTRACTION Bilateral    Left knee repair  1970's   LITHOTRIPSY     twice per pt   right knee surgery     arthroscopic   Sinus surgery for polyps      Current Medications: Current Outpatient Medications on File Prior to Visit  Medication Sig   amLODipine (NORVASC) 2.5 MG tablet TAKE 1 TABLET BY MOUTH ONCE A DAY   aspirin EC 81 MG tablet Take 1 tablet (81 mg total) by mouth daily. Swallow whole.   fluticasone (FLONASE) 50 MCG/ACT nasal spray PLACE 1 SPRAY INTO BOTH NOSTRILS 2 TIMESDAILY   multivitamin-lutein (OCUVITE-LUTEIN) CAPS capsule Take 1 capsule by mouth daily.   mupirocin ointment (BACTROBAN) 2 % Apply 1 application topically 2 (two) times daily.   nitroGLYCERIN (NITROSTAT) 0.4 MG SL tablet Place 1 tablet (0.4 mg total) under the tongue every 5 (five) minutes as needed for chest pain (max 3 doses).   omeprazole (PRILOSEC) 20 MG capsule TAKE 2 CAPSULES BY MOUTH DAILY   ondansetron (ZOFRAN) 4 MG tablet Take 1 tablet (4 mg total) by mouth every 8 (eight) hours as needed.   rosuvastatin (CRESTOR) 20 MG tablet Take 1 tablet (20 mg total) by mouth daily. Please schedule annual  follow-up appointment with Dr. Harrell Gave for refills.   FLUAD QUADRIVALENT 0.5 ML injection  (Patient not taking: Reported on 04/22/2021)   Phenylephrine HCl (SINEX REGULAR NA) Place into the nose. Use daily as needed   No current facility-administered medications on file prior to visit.     Allergies:   Patient has no known allergies.   Social History   Tobacco Use   Smoking status: Never   Smokeless tobacco: Never  Vaping Use   Vaping Use: Never used  Substance Use Topics   Alcohol use: Yes    Alcohol/week: 0.0 standard drinks of alcohol    Comment: rare   Drug use: No    Family History: family history includes Heart  disease in his mother; Prostate cancer in his father. There is no history of Colon cancer. mother had CHF, passed away age 23. Father died suddenly, presumed heart attack, at age 87. Mat Gma had cancer, may have had heart issues as well. Mat Gpa died in his 57s, unknown cause. [name] died in his 97s, unknown cause. [name] also died of unknown causes.  ROS:   Please see the history of present illness.  Additional pertinent ROS otherwise unremarkable.  EKGs/Labs/Other Studies Reviewed:    The following studies were reviewed today: CT cardiac 01/16/20 RCA is a large dominant artery that gives rise to PDA and PLA. Calcified plaque in noted throughout the RCA causing moderate stenosis in the proximal and mid segments (50-69%) and mild stenosis in the distal segment (25-49%)   Left main is a large artery that gives rise to LAD and LCX arteries. The is mild calcified plaque in the distal LM causing minimal stenosis (0-24%)   LAD is a large vessel that has calcified plaque in the proximal to mid segments causing moderate stenosis (50-69%).   LCX is a non-dominant artery that gives rise to one large OM1 branch. There is calcified plaque in the proximal first obtuse marginal branch causing mild stenosis (25-49%).   Other findings:   Normal pulmonary vein drainage into the left atrium.   Normal left atrial appendage without a thrombus.   Normal size of the pulmonary artery.   IMPRESSION: 1. High coronary calcium score of 2616. This was 94th percentile for age and sex matched control ([place]).   2. Normal coronary origin with right dominance.   3. Heavy calcified plaque causing moderate stenosis (50-69%) in the proximal to mid segments of the LAD and RCA   4. CAD-RADS 3. Moderate stenosis. Consider symptom-guided anti-ischemic pharmacotherapy as well as risk factor modification per guideline directed care.   Additional analysis with CT FFR will be submitted and  reported separately.   5.  Asa, statin recommended if no contraindications.  FFR 01/16/20 1. Left Main:  No significant stenosis.   2. LAD: No significant focal stenosis. FFR 0.93 proximally, FFR 0.84 in the mid vessel, and slowly tapers to 0.75 distally with no focal stenosis 3. LCX: No significant stenosis. FFR 0.9 proximally, FFR 0.89 distally 4. RCA: No significant stenosis.  FFR 0.92   IMPRESSION: 1.  CT FFR analysis didn't show any significant focal stenosis.  EKG:  EKG is personally reviewed. 01/13/21 NSR at 64 bpm 04/14/20 sinus rhythm  Recent Labs: No results found for requested labs within last 365 days.  Recent Lipid Panel    Component Value Date/Time   CHOL 112 03/06/2020 0941   TRIG 74 03/06/2020 0941   HDL 50 03/06/2020 0941   CHOLHDL 2.2 03/06/2020  0941   CHOLHDL 4 02/15/2019 0853   VLDL 17.6 02/15/2019 0853   LDLCALC 47 03/06/2020 0941    Physical Exam:    VS:  BP 137/69   Pulse 65   Ht 5' 11"  (1.803 m)   Wt 190 lb (86.2 kg)   BMI 26.50 kg/m     Wt Readings from Last 3 Encounters:  01/20/22 190 lb (86.2 kg)  05/26/21 194 lb (88 kg)  01/13/21 194 lb (88 kg)    Speaking comfortably on the phone, no audible wheezing In no acute distress Alert and oriented Normal affect Normal speech   ASSESSMENT:    1. Coronary artery disease due to calcified coronary lesion   2. Pure hypercholesterolemia   3. Essential hypertension   4. Chronic venous insufficiency    PLAN:    CAD without angina Vascular calcification Family history of heart disease -CTshows elevated calcium score and moderate CAD, but FFR was negative for flow limiting obstruction -he is now on aspirin 81 mg daily and rosuvastatin 20 mg daily -has PRN SL NG, understands how to use and risk with PDE5i -now has rare atypical (MSK) pain -counseled on red flag warning signs that need immediate medical attention  Hypertension -systolic slightly elevated but typically at goal -amlodipine  now 2.5 mg daily, do not suspect this is main contributor to LE edema but can be changed in the future if needed  Bilateral LE edema -unfortunately he was unable to get camera to work today so I could not evaluated. Has been seen by his podiatrist Dr. Milinda Pointer and thought to be venous insufficiency. Discussed compression, elevation. Gave info for custom compression stocking service  Hypercholesterolemia -continue rosuvastatin 20 mg daily given CAD, vascular calcification -lipids 03/06/20 show LDL has gone from 128 to 47, at goal of <70  Cardiac risk counseling and prevention recommendations: -recommend heart healthy/Mediterranean diet, with whole grains, fruits, vegetable, fish, lean meats, nuts, and olive oil. Limit salt. -recommend moderate walking, 3-5 times/week for 30-50 minutes each session. Aim for at least 150 minutes/week. Goal should be pace of 3 miles/hours, or walking 1.5 miles in 30 minutes -recommend avoidance of tobacco products. Avoid excess alcohol.   Plan for follow up: 12 mos or sooner  Today, I have spent 23 minutes with the patient with telehealth technology discussing the above problems.  Additional time spent in chart review, documentation, and communication.   Medication Adjustments/Labs and Tests Ordered: Current medicines are reviewed at length with the patient today.  Concerns regarding medicines are outlined above.  No orders of the defined types were placed in this encounter.   No orders of the defined types were placed in this encounter.    Patient Instructions  Medication Instructions:  Your Physician recommend you continue on your current medication as directed.     *If you need a refill on your cardiac medications before your next appointment, please call your pharmacy*   Lab Work: None ordered today   Testing/Procedures: None ordered today   Follow-Up: At [facility], you and your health needs are our priority.  As part of our  continuing mission to provide you with exceptional heart care, we have created designated Provider Care Teams.  These Care Teams include your primary Cardiologist (physician) and Advanced Practice Providers (APPs -  Physician Assistants and Nurse Practitioners) who all work together to provide you with the care you need, when you need it.  We recommend signing up for the patient portal called "MyChart".  Sign up information is provided on this After Visit Summary.  MyChart is used to connect with patients for Virtual Visits (Telemedicine).  Patients are able to view lab/test results, encounter notes, upcoming appointments, etc.  Non-urgent messages can be sent to your provider as well.   To learn more about what you can do with MyChart, go to [email or web address].    Your next appointment:   1 year(s)  The format for your next appointment:   In Person  Provider:   Buford Dresser, MD     Look into [employer] for fitted compression stockings: [address] [place], [place], [place] [address] [phone] [email or web address]   Signed, Buford Dresser, MD PhD 01/20/2022     [place]

## 2022-01-20 NOTE — Patient Instructions (Addendum)
Medication Instructions:  Your Physician recommend you continue on your current medication as directed.     *If you need a refill on your cardiac medications before your next appointment, please call your pharmacy*   Lab Work: None ordered today   Testing/Procedures: None ordered today   Follow-Up: At [facility], you and your health needs are our priority.  As part of our continuing mission to provide you with exceptional heart care, we have created designated Provider Care Teams.  These Care Teams include your primary Cardiologist (physician) and Advanced Practice Providers (APPs -  Physician Assistants and Nurse Practitioners) who all work together to provide you with the care you need, when you need it.  We recommend signing up for the patient portal called "MyChart".  Sign up information is provided on this After Visit Summary.  MyChart is used to connect with patients for Virtual Visits (Telemedicine).  Patients are able to view lab/test results, encounter notes, upcoming appointments, etc.  Non-urgent messages can be sent to your provider as well.   To learn more about what you can do with MyChart, go to [email or web address].    Your next appointment:   1 year(s)  The format for your next appointment:   In Person  Provider:   Buford Dresser, MD     Look into [employer] for fitted compression stockings: [address] [place], [place], [place] [address] [phone] [email or web address]

## 2022-01-21 ENCOUNTER — Encounter (HOSPITAL_BASED_OUTPATIENT_CLINIC_OR_DEPARTMENT_OTHER): Payer: Self-pay | Admitting: Cardiology

## 2022-02-03 ENCOUNTER — Encounter: Payer: Self-pay | Admitting: Gastroenterology

## 2022-02-16 ENCOUNTER — Ambulatory Visit: Payer: PPO

## 2022-02-16 ENCOUNTER — Ambulatory Visit: Payer: PPO | Admitting: Podiatry

## 2022-02-16 ENCOUNTER — Encounter: Payer: Self-pay | Admitting: Podiatry

## 2022-02-16 DIAGNOSIS — I872 Venous insufficiency (chronic) (peripheral): Secondary | ICD-10-CM

## 2022-02-16 DIAGNOSIS — M205X1 Other deformities of toe(s) (acquired), right foot: Secondary | ICD-10-CM | POA: Diagnosis not present

## 2022-02-17 NOTE — Progress Notes (Signed)
He presents today for follow-up of his right foot and leg swelling.  States that he gets sharp shooting pains in it occasionally and particularly some numbness around the forefoot.  States that he saw his cardiologist who stated that he had had a venous study in the past and did not need the venous study that we had ordered.  He states that sometimes the leg swells worse than other times and his wife goes on to say that especially when he has a higher salt intake he swells a lot more.  Objective: Vital signs are stable he is alert and oriented x3.  No reproducible pain on palpation or range of motion of the joints that surgery was performed on first and second metatarsal phalangeal joints.  He does have some pitting edema to the leg and calf and the anterior shin right greater than left he also has warmth varicosities to the right medial leg and ankle.  Assessment: Most likely venous insufficiency right lower extremity.  I do not see as to where it has anything to do with his previous surgery at this point it has been quite some time.  Plan: I highly recommended that he obtain the ordered venous insufficiency exam most likely is going to be compression hose and watching salt intake however we need to do need to make sure that is not something that could be surgically corrected by the vascular doctors.

## 2022-03-01 ENCOUNTER — Ambulatory Visit: Payer: PPO | Attending: Podiatry

## 2022-03-01 DIAGNOSIS — I872 Venous insufficiency (chronic) (peripheral): Secondary | ICD-10-CM

## 2022-03-03 ENCOUNTER — Encounter (HOSPITAL_COMMUNITY): Payer: PPO

## 2022-03-14 ENCOUNTER — Telehealth: Payer: Self-pay | Admitting: *Deleted

## 2022-03-14 NOTE — Telephone Encounter (Signed)
-----   Message from Garrel Ridgel, [place] sent at 03/10/2022  7:19 AM EDT ----- Philmore Pali studies are normal and you might want to send him to the lymphedema clinic to see if they can do anything for his swelling.  I don't feel that it is from the surgery.

## 2022-03-17 ENCOUNTER — Other Ambulatory Visit: Payer: Self-pay | Admitting: Family Medicine

## 2022-03-17 NOTE — Telephone Encounter (Signed)
Patient is overdue for appt. Please call to schedule.

## 2022-03-17 NOTE — Telephone Encounter (Signed)
Refill request for AMLODIPINE BESYLATE 2.5 MG TAB   LOV - 10/19/20 Next OV - not scheduled Last refill - 09/20/21 #90/0

## 2022-03-18 NOTE — Telephone Encounter (Signed)
Patient has been scheduled

## 2022-03-25 ENCOUNTER — Encounter: Payer: Self-pay | Admitting: Family Medicine

## 2022-03-25 ENCOUNTER — Other Ambulatory Visit (HOSPITAL_BASED_OUTPATIENT_CLINIC_OR_DEPARTMENT_OTHER): Payer: Self-pay | Admitting: Cardiology

## 2022-03-25 ENCOUNTER — Ambulatory Visit (INDEPENDENT_AMBULATORY_CARE_PROVIDER_SITE_OTHER): Payer: PPO | Admitting: Family Medicine

## 2022-03-25 VITALS — BP 120/64 | HR 62 | Temp 97.4°F | Ht 71.0 in | Wt 198.0 lb

## 2022-03-25 DIAGNOSIS — M25519 Pain in unspecified shoulder: Secondary | ICD-10-CM

## 2022-03-25 DIAGNOSIS — Z0000 Encounter for general adult medical examination without abnormal findings: Secondary | ICD-10-CM

## 2022-03-25 DIAGNOSIS — Z7189 Other specified counseling: Secondary | ICD-10-CM

## 2022-03-25 DIAGNOSIS — Z125 Encounter for screening for malignant neoplasm of prostate: Secondary | ICD-10-CM | POA: Diagnosis not present

## 2022-03-25 DIAGNOSIS — I10 Essential (primary) hypertension: Secondary | ICD-10-CM

## 2022-03-25 DIAGNOSIS — E785 Hyperlipidemia, unspecified: Secondary | ICD-10-CM | POA: Diagnosis not present

## 2022-03-25 LAB — COMPREHENSIVE METABOLIC PANEL
ALT: 14 U/L (ref 0–53)
AST: 19 U/L (ref 0–37)
Albumin: 4.5 g/dL (ref 3.5–5.2)
Alkaline Phosphatase: 65 U/L (ref 39–117)
BUN: 16 mg/dL (ref 6–23)
CO2: 30 mEq/L (ref 19–32)
Calcium: 9.2 mg/dL (ref 8.4–10.5)
Chloride: 104 mEq/L (ref 96–112)
Creatinine, Ser: 1.1 mg/dL (ref 0.40–1.50)
GFR: 65.87 mL/min (ref >60.00)
Glucose, Bld: 68 mg/dL — ABNORMAL LOW (ref 70–99)
Potassium: 4.2 mEq/L (ref 3.5–5.1)
Sodium: 140 mEq/L (ref 135–145)
Total Bilirubin: 0.7 mg/dL (ref 0.2–1.2)
Total Protein: 6.8 g/dL (ref 6.0–8.3)

## 2022-03-25 LAB — CBC WITH DIFFERENTIAL/PLATELET
Basophils Absolute: 0 10*3/uL (ref 0.0–0.1)
Basophils Relative: 0.4 % (ref 0.0–3.0)
Eosinophils Absolute: 0.3 10*3/uL (ref 0.0–0.7)
Eosinophils Relative: 4.7 % (ref 0.0–5.0)
HCT: 41.8 % (ref 39.0–52.0)
Hemoglobin: 14 g/dL (ref 13.0–17.0)
Lymphocytes Relative: 32.8 % (ref 12.0–46.0)
Lymphs Abs: 2.1 10*3/uL (ref 0.7–4.0)
MCHC: 33.6 g/dL (ref 30.0–36.0)
MCV: 94 fl (ref 78.0–100.0)
Monocytes Absolute: 0.8 10*3/uL (ref 0.1–1.0)
Monocytes Relative: 12.4 % — ABNORMAL HIGH (ref 3.0–12.0)
Neutro Abs: 3.2 10*3/uL (ref 1.4–7.7)
Neutrophils Relative %: 49.7 % (ref 43.0–77.0)
Platelets: 272 10*3/uL (ref 150.0–400.0)
RBC: 4.45 Mil/uL (ref 4.22–5.81)
RDW: 13.2 % (ref 11.5–15.5)
WBC: 6.4 10*3/uL (ref 4.0–10.5)

## 2022-03-25 LAB — LIPID PANEL
Cholesterol: 130 mg/dL (ref 0–200)
HDL: 57.8 mg/dL (ref >39.00)
LDL Cholesterol: 53 mg/dL (ref 0–99)
NonHDL: 72.04
Total CHOL/HDL Ratio: 2
Triglycerides: 93 mg/dL (ref 0.0–149.0)
VLDL: 18.6 mg/dL (ref 0.0–40.0)

## 2022-03-25 LAB — TSH: TSH: 1.9 u[IU]/mL (ref 0.35–5.50)

## 2022-03-25 LAB — PSA, MEDICARE: PSA: 1.11 ng/ml (ref 0.10–4.00)

## 2022-03-25 MED ORDER — OMEPRAZOLE 20 MG PO CPDR: 20.0000 mg | DELAYED_RELEASE_CAPSULE | Freq: Every day | ORAL | Status: DC

## 2022-03-25 NOTE — Patient Instructions (Signed)
Go to the lab on the way out.   If you have mychart we'll likely use that to update you.    Take care.  Glad to see you. Let me know if you don't get a call about seeing ortho.  Reasonable to try a calf high compression stocking.

## 2022-03-25 NOTE — Progress Notes (Unsigned)
Hypertension:    Using medication without problems or lightheadedness: rarely lightheaded, cautions d/w pt.   Chest pain with exertion: more from shoulder pain, not primary chest pain.   Edema:only from prior foot operation.  Discussed using compression stockings.  See after visit summary. Short of breath:no Fatigue noted.    Elevated Cholesterol: Using medications without problems: yes Muscle aches: likely not from statin.  Diet compliance: yes Exercise: yes Labs d/w pt.    He is dealing with pain in the R shoulder and R 1st-3rd fingers.    Tetanus 2018 zostavax done 2014.  PNA 2019 Flu shot 2023 Covid vaccine prev done.   PSA pending 2023.   Colonoscopy 2018. 5 year f/u.  Diet and exercise d/w pt.  Wife and daughter Ailene Ravel equally designated if patient were incapacitated.    Meds, vitals, and allergies reviewed.   ROS: Per HPI unless specifically indicated in ROS section   GEN: nad, alert and oriented HEENT: ncat NECK: supple w/o LA CV: rrr.  PULM: ctab, no inc wob ABD: soft, +bs EXT: no calf edema but R foot slightly puffy compared to L SKIN: no acute rash  R shoulder pain with abduction, no arm drop. R 1st 3 fingers with dec sensation but normal strength.

## 2022-03-27 DIAGNOSIS — I10 Essential (primary) hypertension: Secondary | ICD-10-CM | POA: Insufficient documentation

## 2022-03-27 DIAGNOSIS — E785 Hyperlipidemia, unspecified: Secondary | ICD-10-CM | POA: Insufficient documentation

## 2022-03-27 NOTE — Assessment & Plan Note (Signed)
See notes on labs.  Continue crestor for now.  Continue work on diet and exercise.

## 2022-03-27 NOTE — Assessment & Plan Note (Signed)
Refer to orthopedics.  The concern is that he could have shoulder pathology in addition to a radicular source contributing to symptoms in the hand.  Shoulder films from 2022 reviewed with patient, with glenohumeral arthritic changes noted.

## 2022-03-27 NOTE — Assessment & Plan Note (Signed)
See notes on labs.  Continue amlodipine for now.  Continue work on diet and exercise.

## 2022-03-27 NOTE — Assessment & Plan Note (Signed)
Wife and daughter Ailene Ravel equally designated if patient were incapacitated.

## 2022-03-27 NOTE — Assessment & Plan Note (Signed)
Tetanus 2018 zostavax done 2014.  PNA 2019 Flu shot 2023 Covid vaccine prev done.   PSA pending 2023.   Colonoscopy 2018. 5 year f/u.  Diet and exercise d/w pt.  Wife and daughter Ailene Ravel equally designated if patient were incapacitated.

## 2022-03-28 ENCOUNTER — Encounter: Payer: Self-pay | Admitting: Family Medicine

## 2022-03-28 NOTE — Telephone Encounter (Signed)
Rx(s) sent to pharmacy electronically.  

## 2022-03-29 ENCOUNTER — Other Ambulatory Visit: Payer: Self-pay

## 2022-03-29 ENCOUNTER — Encounter: Payer: Self-pay | Admitting: Family Medicine

## 2022-03-29 MED ORDER — MELATONIN 5 MG PO TABS: 5.0000 mg | ORAL_TABLET | Freq: Every day | ORAL | 0 refills | Status: DC

## 2022-03-30 ENCOUNTER — Encounter: Payer: Self-pay | Admitting: Family Medicine

## 2022-04-04 ENCOUNTER — Telehealth: Payer: Self-pay

## 2022-04-04 DIAGNOSIS — M19011 Primary osteoarthritis, right shoulder: Secondary | ICD-10-CM | POA: Diagnosis not present

## 2022-04-04 NOTE — Telephone Encounter (Signed)
Per mychart request, CD of shoulder xrays from 10/19/20 has been made.  I called patient to notify him this is available for pickup in our front office.  He will pick it up today.

## 2022-04-19 DIAGNOSIS — G5601 Carpal tunnel syndrome, right upper limb: Secondary | ICD-10-CM | POA: Diagnosis not present

## 2022-04-22 ENCOUNTER — Telehealth: Payer: Self-pay | Admitting: Family Medicine

## 2022-04-22 DIAGNOSIS — G5600 Carpal tunnel syndrome, unspecified upper limb: Secondary | ICD-10-CM

## 2022-04-22 NOTE — Telephone Encounter (Signed)
Please call pt about options re: potential hand surgery.  Discussed with his wife.  If he needs local referral then let me know.

## 2022-04-22 NOTE — Telephone Encounter (Signed)
Spoke with patient and he states he does need a new referral for his hand. The doctor he saw does surgery in [place] and he does not want to go to [facility] for surgery. States he can see one of the hand surgeons in [place] if there is no on that can do it in [place].

## 2022-04-24 NOTE — Telephone Encounter (Signed)
I put in a referral to Dr. Amedeo Plenty in [place].  Let me know if he doesn't get called about that.  Thanks.

## 2022-04-24 NOTE — Addendum Note (Signed)
Addended by: Tonia Ghent on: 04/24/2022 04:11 PM   Modules accepted: Orders

## 2022-04-25 NOTE — Telephone Encounter (Signed)
Patient wife aware referral was done.

## 2022-05-18 DIAGNOSIS — R350 Frequency of micturition: Secondary | ICD-10-CM | POA: Diagnosis not present

## 2022-05-18 DIAGNOSIS — N200 Calculus of kidney: Secondary | ICD-10-CM | POA: Diagnosis not present

## 2022-06-01 IMAGING — DX DG CHEST 2V
2 series · 2 of 2 positions shown · non-contrast
Comparison: 11/28/2016

CLINICAL DATA: Left shoulder pain

EXAM:
CHEST - 2 VIEW

[chest pa]
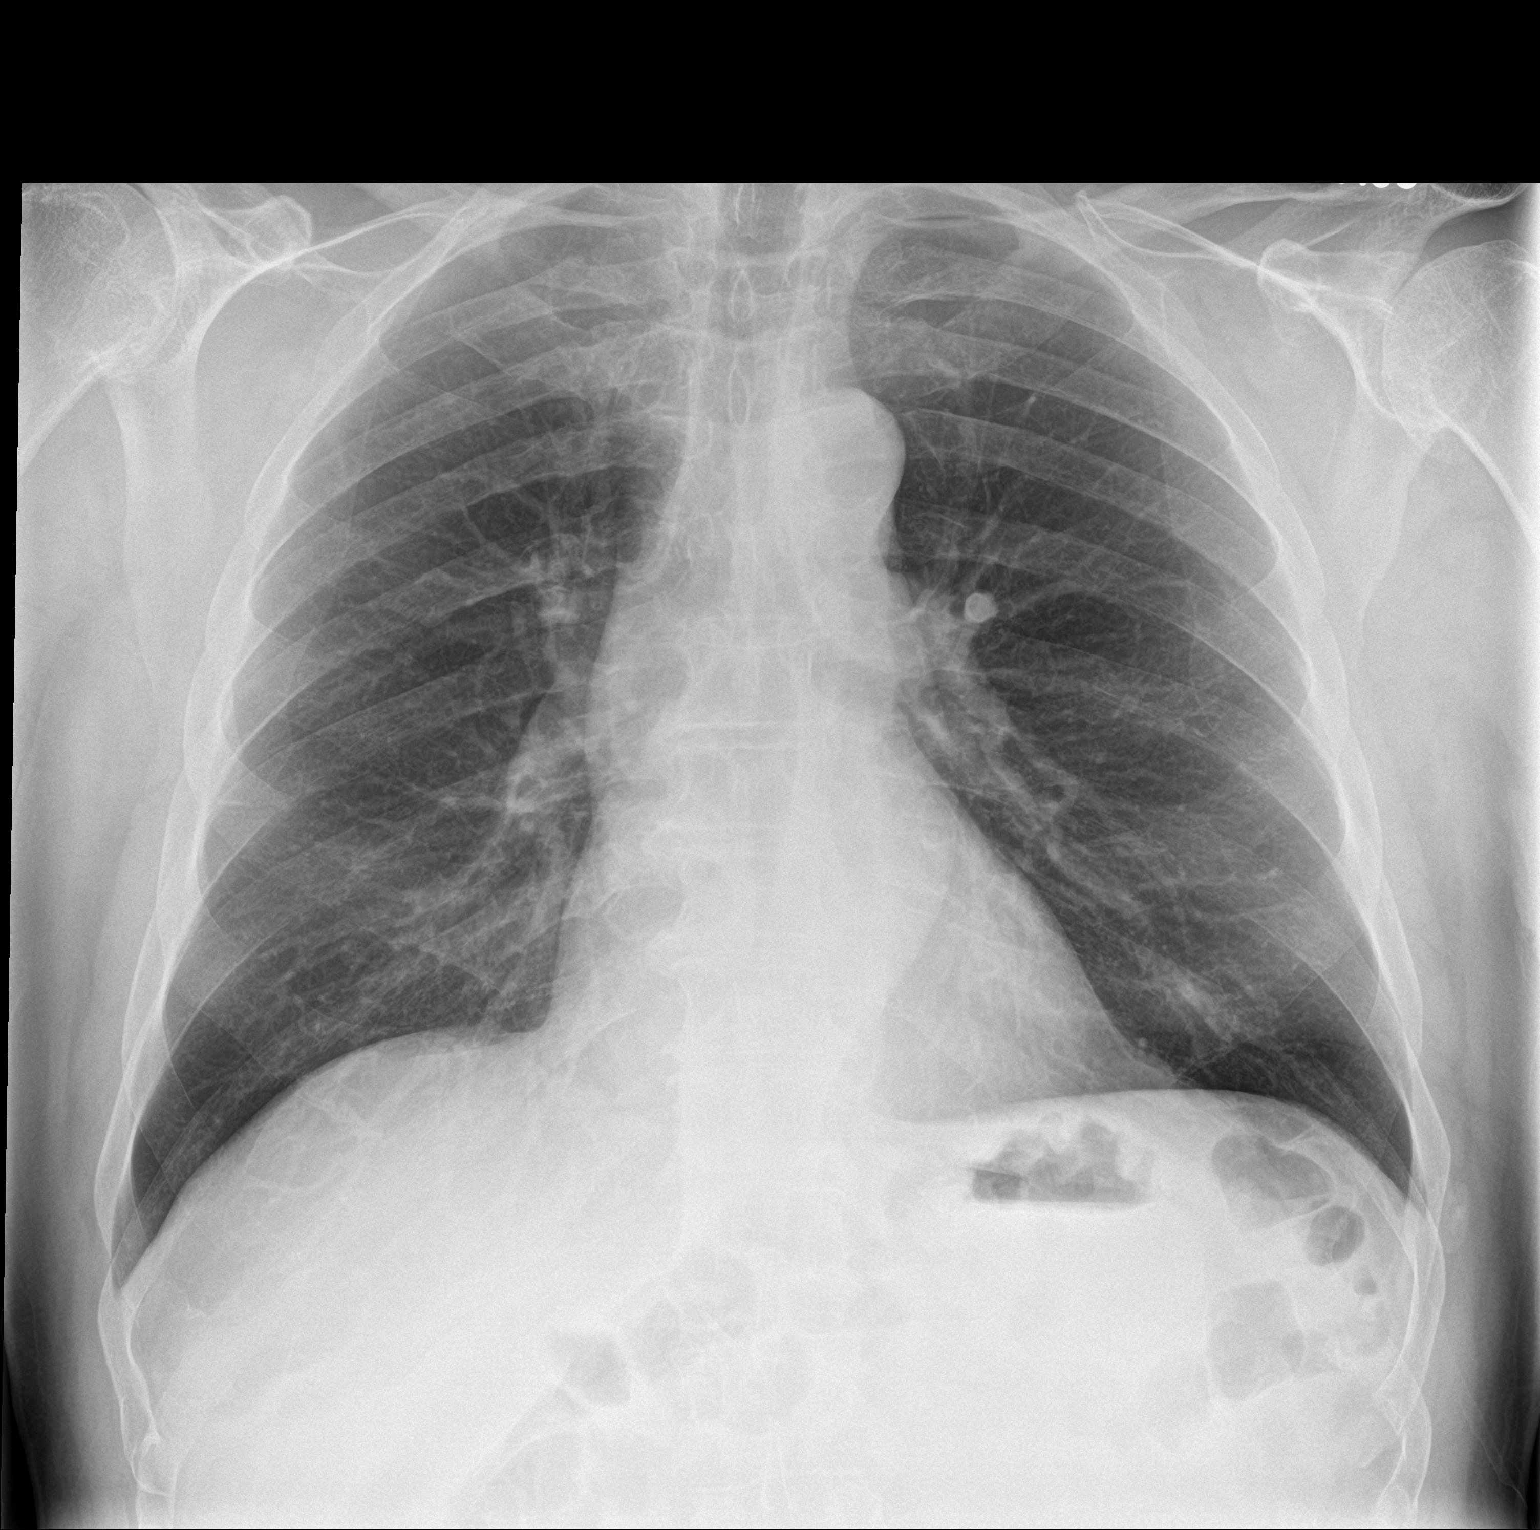

[chest lat]
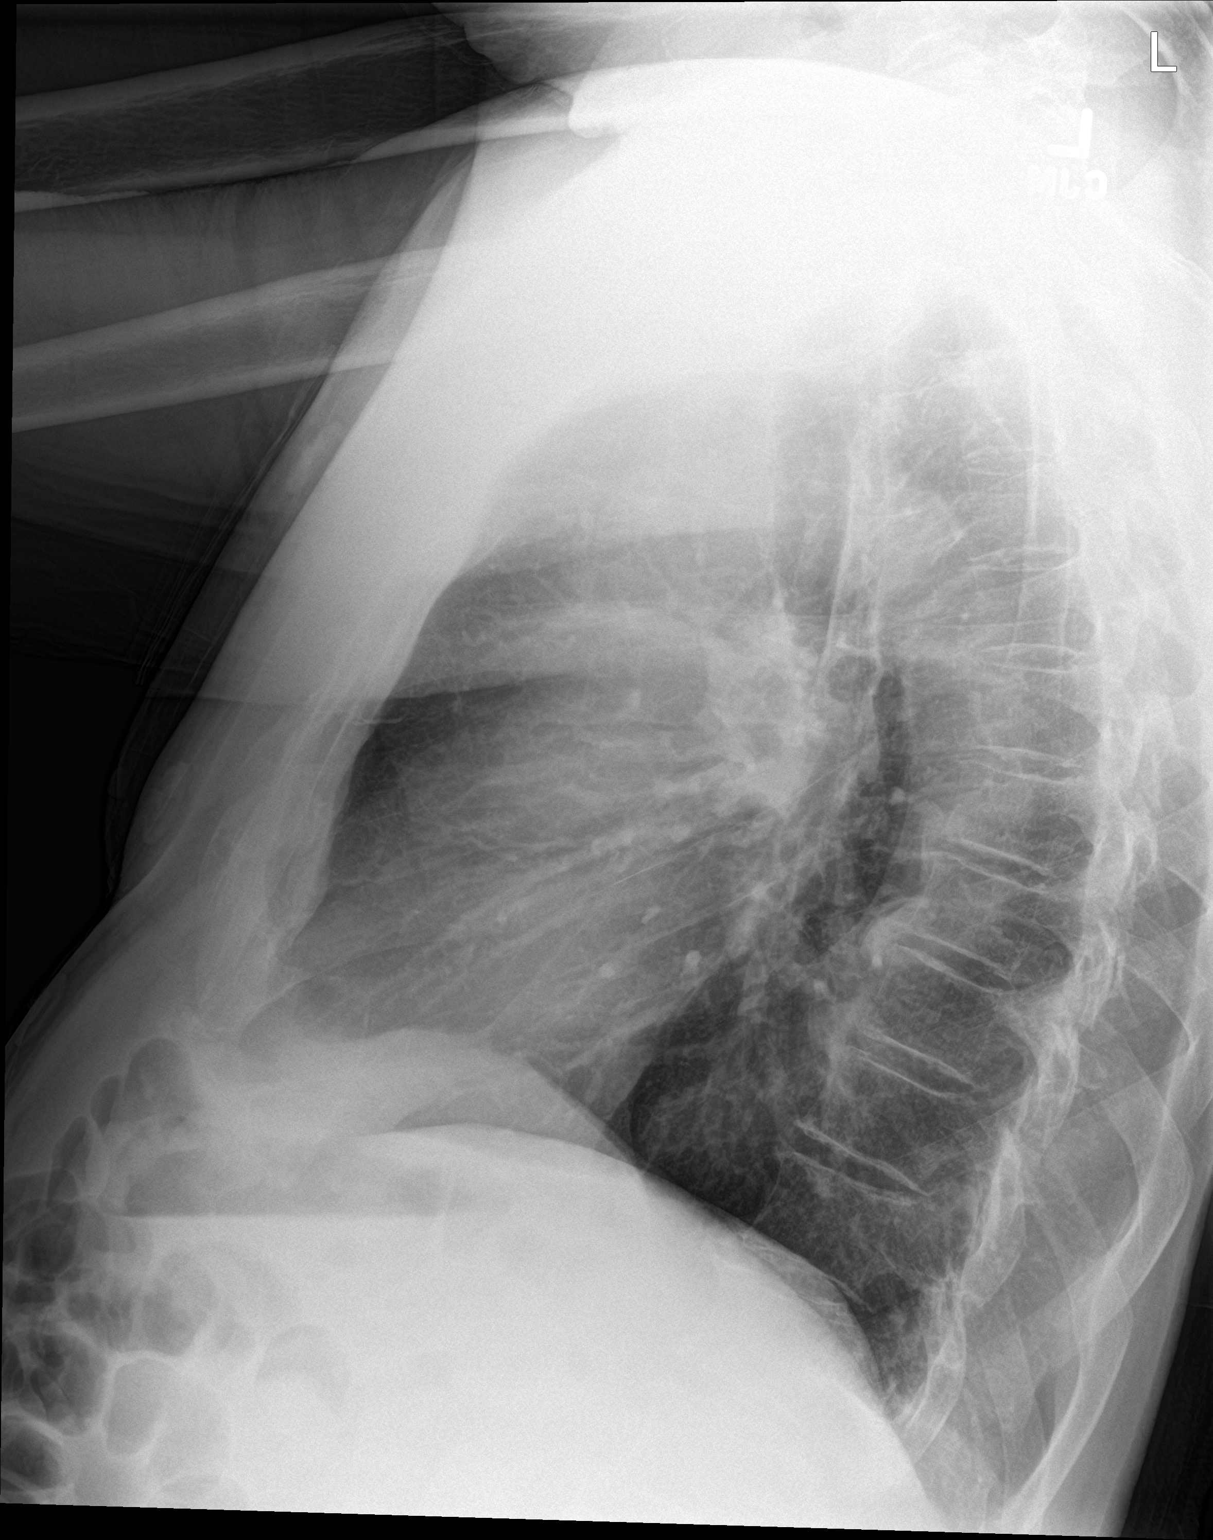

[2 of 2 positions shown; findings below may reference images not displayed]

FINDINGS: The heart size and mediastinal contours are within normal limits.
Both lungs are clear. The visualized skeletal structures are
unremarkable.
IMPRESSION: No active cardiopulmonary disease.

## 2022-06-06 ENCOUNTER — Other Ambulatory Visit: Payer: Self-pay | Admitting: Family Medicine

## 2022-06-13 ENCOUNTER — Other Ambulatory Visit: Payer: Self-pay | Admitting: Family Medicine

## 2022-06-15 DIAGNOSIS — M19011 Primary osteoarthritis, right shoulder: Secondary | ICD-10-CM | POA: Diagnosis not present

## 2022-06-22 DIAGNOSIS — G5601 Carpal tunnel syndrome, right upper limb: Secondary | ICD-10-CM | POA: Diagnosis not present

## 2022-06-22 DIAGNOSIS — L918 Other hypertrophic disorders of the skin: Secondary | ICD-10-CM | POA: Diagnosis not present

## 2022-06-22 DIAGNOSIS — L570 Actinic keratosis: Secondary | ICD-10-CM | POA: Diagnosis not present

## 2022-06-22 DIAGNOSIS — L814 Other melanin hyperpigmentation: Secondary | ICD-10-CM | POA: Diagnosis not present

## 2022-06-22 DIAGNOSIS — M13831 Other specified arthritis, right wrist: Secondary | ICD-10-CM | POA: Diagnosis not present

## 2022-06-22 DIAGNOSIS — D1801 Hemangioma of skin and subcutaneous tissue: Secondary | ICD-10-CM | POA: Diagnosis not present

## 2022-06-22 DIAGNOSIS — G252 Other specified forms of tremor: Secondary | ICD-10-CM | POA: Diagnosis not present

## 2022-06-22 DIAGNOSIS — L821 Other seborrheic keratosis: Secondary | ICD-10-CM | POA: Diagnosis not present

## 2022-06-22 DIAGNOSIS — M79641 Pain in right hand: Secondary | ICD-10-CM | POA: Diagnosis not present

## 2022-06-30 ENCOUNTER — Ambulatory Visit (INDEPENDENT_AMBULATORY_CARE_PROVIDER_SITE_OTHER): Payer: PPO

## 2022-06-30 VITALS — Ht 71.0 in | Wt 198.0 lb

## 2022-06-30 DIAGNOSIS — Z0000 Encounter for general adult medical examination without abnormal findings: Secondary | ICD-10-CM | POA: Diagnosis not present

## 2022-06-30 NOTE — Progress Notes (Signed)
I connected with  Trevor Ross on 06/30/22 by a audio enabled telemedicine application and verified that I am speaking with the correct person using two identifiers.  Patient Location: Home  Provider Location: Home Office  I discussed the limitations of evaluation and management by telemedicine. The patient expressed understanding and agreed to proceed.  Subjective:   Trevor Ross is a 77 y.o. male who presents for Medicare Annual/Subsequent preventive examination.  Review of Systems     Cardiac Risk Factors include: advanced age (>59mn, >>69women);hypertension;male gender;dyslipidemia     Objective:    Today's Vitals   06/30/22 1346  Weight: 198 lb (89.8 kg)  Height: '5\' 11"'$  (1.803 m)   Body mass index is 27.62 kg/m.     06/30/2022    1:55 PM 05/26/2021   11:38 AM 04/14/2020    7:29 PM 11/14/2019    9:33 AM 02/08/2018   10:50 AM 02/07/2017   10:28 AM 08/20/2012    9:39 AM  Advanced Directives  Does Patient Have a Medical Advance Directive? Yes Yes No No No;Yes Yes   Type of [place]amed[place]of [place] will [place] will   [place] will [place] Har[place] will   Does patient want to make changes to medical advance directive? No - Patient declined Yes (MAU/Ambulatory/Procedural Areas - Information given)   No - Patient declined    Copy of [place] Chart? Yes - validated most recent copy scanned in chart (See row information)    No - copy requested No - copy requested   Would patient like information on creating a medical advance directive?    No - Patient declined No - Patient declined    Pre-existing out of facility DNR order (yellow form or pink MOST form)       No    Current Medications (verified) Outpatient Encounter Medications as of 06/30/2022  Medication Sig   amLODipine (NORVASC) 2.5 MG tablet TAKE 1 TABLET BY MOUTH ONCE A DAY   aspirin EC 81 MG  tablet Take 1 tablet (81 mg total) by mouth daily. Swallow whole.   fluticasone (FLONASE) 50 MCG/ACT nasal spray PLACE 1 SPRAY INTO BOTH NOSTRILS 2 TIMESDAILY   melatonin 5 MG TABS Take 1 tablet (5 mg total) by mouth at bedtime.   multivitamin-lutein (OCUVITE-LUTEIN) CAPS capsule Take 1 capsule by mouth daily.   omeprazole (PRILOSEC) 20 MG capsule TAKE 2 CAPSULES BY MOUTH DAILY   rosuvastatin (CRESTOR) 20 MG tablet Take 1 tablet (20 mg total) by mouth daily.   nitroGLYCERIN (NITROSTAT) 0.4 MG SL tablet Place 1 tablet (0.4 mg total) under the tongue every 5 (five) minutes as needed for chest pain (max 3 doses).   No facility-administered encounter medications on file as of 06/30/2022.    Allergies (verified) Patient has no known allergies.   History: Past Medical History:  Diagnosis Date   Allergy    Arthritis    hands   Diverticulosis of colon 09/15/2006   Dysphagia    Hiatal hernia    Hyperlipidemia    Hypertension    Nasal polyps    Nephrolithiasis    PSA per Dr. KSerita Butcher  OSA (obstructive sleep apnea)    Past Surgical History:  Procedure Laterality Date   APPENDECTOMY  1988   CARPAL TUNNEL RELEASE Left    CATARACT EXTRACTION Bilateral    Left knee repair  152's  LITHOTRIPSY  twice per pt   right knee surgery     arthroscopic   Sinus surgery for polyps     Family History  Problem Relation Age of Onset   Heart disease Mother    Prostate cancer Father    Colon cancer Maternal Grandmother    Social History   Socioeconomic History   Marital status: Married    Spouse name: Not on file   Number of children: 0   Years of education: Not on file   Highest education level: Not on file  Occupational History   Occupation: Insurance account manager  Tobacco Use   Smoking status: Never   Smokeless tobacco: Never  Vaping Use   Vaping Use: Never used  Substance and Sexual Activity   Alcohol use: Yes    Alcohol/week: 0.0 standard drinks of alcohol    Comment: rare   Drug  use: No   Sexual activity: Not on file  Other Topics Concern   Not on file  Social History Narrative   1 step-daughter, 1 grandson   Married 1980   Social Determinants of Health   Financial Resource Strain: Low Risk  (06/30/2022)   Overall Financial Resource Strain (CARDIA)    Difficulty of Paying Living Expenses: Not hard at all  Food Insecurity: No Food Insecurity (06/30/2022)   Hunger Vital Sign    Worried About Running Out of Food in the Last Year: Never true    Ran Out of Food in the Last Year: Never true  Transportation Needs: No Transportation Needs (06/30/2022)   PRAPARE - Hydrologist (Medical): No    Lack of Transportation (Non-Medical): No  Physical Activity: Insufficiently Active (06/30/2022)   Exercise Vital Sign    Days of Exercise per Week: 2 days    Minutes of Exercise per Session: 30 min  Stress: No Stress Concern Present (06/30/2022)   [place]    Feeling of Stress : Not at all  Social Connections: Moderately Isolated (06/30/2022)   Social Connection and Isolation Panel [NHANES]    Frequency of Communication with Friends and Family: Twice a week    Frequency of Social Gatherings with Friends and Family: Three times a week    Attends Religious Services: Never    Active Member of Clubs or Organizations: No    Attends Music therapist: Never    Marital Status: Married    Tobacco Counseling Counseling given: Not Answered   Clinical Intake:  Pre-visit preparation completed: Yes  Pain : No/denies pain     Nutritional Risks: None Diabetes: No  How often do you need to have someone help you when you read instructions, pamphlets, or other written materials from your doctor or pharmacy?: 1 - Never  Diabetic? no  Interpreter Needed?: No  Information entered by :: C.Ahsley Attwood LPN   Activities of Daily Living    06/30/2022    1:56 PM  In your  present state of health, do you have any difficulty performing the following activities:  Hearing? 0  Vision? 0  Difficulty concentrating or making decisions? 0  Walking or climbing stairs? 1  Comment a little due to knee replacement  Dressing or bathing? 0  Doing errands, shopping? 0  Preparing Food and eating ? N  Using the Toilet? N  In the past six months, have you accidently leaked urine? Y  Comment occasional followed by urologist  Do you have problems with loss of bowel  control? N  Managing your Medications? N  Managing your Finances? N  Housekeeping or managing your Housekeeping? N    Patient Care Team: Tonia Ghent, MD as PCP - General (Family Medicine) Buford Dresser, MD as PCP - Cardiology (Cardiology)  Indicate any recent Medical Services you may have received from other than [facility] providers in the past year (date may be approximate).     Assessment:   This is a routine wellness examination for Sasuke.  Hearing/Vision screen Hearing Screening - Comments:: No aid Vision Screening - Comments:: Readers - Dr.Lyle  Dietary issues and exercise activities discussed: Current Exercise Habits: Home exercise routine, Type of exercise: strength training/weights, Time (Minutes): 30, Frequency (Times/Week): 2, Weekly Exercise (Minutes/Week): 60, Intensity: Mild, Exercise limited by: None identified   Goals Addressed             This Visit's Progress    Patient Stated       No new goals       Depression Screen    06/30/2022    1:54 PM 05/26/2021   11:40 AM 04/21/2020   11:29 AM 02/19/2019    8:46 AM 02/08/2018   10:50 AM 02/07/2017    9:51 AM  PHQ 2/9 Scores  PHQ - 2 Score 0 0 0 0 0 0  PHQ- 9 Score     0 0    Fall Risk    06/30/2022    1:48 PM 05/26/2021   11:39 AM 04/21/2020   11:29 AM 02/19/2019    8:45 AM 02/08/2018   10:50 AM  Fall Risk   Falls in the past year? 0 0 0 0 No  Number falls in past yr: 0 0 0 0   Injury with Fall? 0 0 0    Risk  for fall due to : No Fall Risks No Fall Risks     Follow up Falls prevention discussed;Falls evaluation completed Falls prevention discussed Falls evaluation completed      FALL RISK PREVENTION PERTAINING TO THE HOME:  Any stairs in or around the home? Yes  If so, are there any without handrails? No  Home free of loose throw rugs in walkways, pet beds, electrical cords, etc? Yes  Adequate lighting in your home to reduce risk of falls? Yes   ASSISTIVE DEVICES UTILIZED TO PREVENT FALLS:  Life alert? No  Use of a cane, walker or w/c? No  Grab bars in the bathroom? No  Shower chair or bench in shower? No  Elevated toilet seat or a handicapped toilet? No    Cognitive Function:    02/08/2018   10:50 AM 02/07/2017    9:51 AM  MMSE - Mini Mental State Exam  Orientation to time 5 5  Orientation to Place 5 5  Registration 3 3  Attention/ Calculation 0 0  Recall 3 3  Language- name 2 objects 0 0  Language- repeat 1 1  Language- follow 3 step command 3 3  Language- read & follow direction 0 0  Write a sentence 0 0  Copy design 0 0  Total score 20 20        06/30/2022    1:58 PM  6CIT Screen  What Year? 0 points  What time? 0 points  Count back from 20 0 points  Months in reverse 0 points  Repeat phrase 4 points    Immunizations Immunization History  Administered Date(s) Administered   Influenza, High Dose Seasonal PF 02/04/2022   Influenza,inj,Quad PF,6+ Mos 02/14/2017,  02/15/2018, 02/19/2019   Influenza-Unspecified 03/05/2020, 02/26/2021, 02/04/2022   PFIZER(Purple Top)SARS-COV-2 Vaccination 07/05/2019, 07/30/2019   Pneumococcal Conjugate-13 02/14/2017   Pneumococcal Polysaccharide-23 02/15/2018   Td 03/12/2003   Tdap 02/28/2017   Zoster, Live 11/01/2012    TDAP status: Up to date  Flu Vaccine status: Up to date  Pneumococcal vaccine status: Up to date  Covid-19 vaccine status: Declined, Education has been provided regarding the importance of this vaccine but  patient still declined. Advised may receive this vaccine at local pharmacy or Health Dept.or vaccine clinic. Aware to provide a copy of the vaccination record if obtained from local pharmacy or Health Dept. Verbalized acceptance and understanding.  Qualifies for Shingles Vaccine? Yes   Zostavax completed No   Shingrix Completed?: No.    Education has been provided regarding the importance of this vaccine. Patient has been advised to call insurance company to determine out of pocket expense if they have not yet received this vaccine. Advised may also receive vaccine at local pharmacy or Health Dept. Verbalized acceptance and understanding.  Screening Tests Health Maintenance  Topic Date Due   Zoster Vaccines- Shingrix (1 of 2) Never done   COVID-19 Vaccine (3 - Pfizer risk series) 08/27/2019   Medicare Annual Wellness (AWV)  07/01/2023   COLONOSCOPY (Pts 45-66yr Insurance coverage will need to be confirmed)  02/11/2024   DTaP/Tdap/Td (3 - Td or Tdap) 03/01/2027   Pneumonia Vaccine 76 Years old  Completed   INFLUENZA VACCINE  Completed   Hepatitis C Screening  Completed   HPV VACCINES  Aged Out    Health Maintenance  Health Maintenance Due  Topic Date Due   Zoster Vaccines- Shingrix (1 of 2) Never done   COVID-19 Vaccine (3 - Pfizer risk series) 08/27/2019    Colorectal cancer screening: Type of screening: Colonoscopy. Completed 02/23/2017. Repeat every 7 years would like to repeat in 2025.  Lung Cancer Screening: (Low Dose CT Chest recommended if Age 76-80years, 30 pack-year currently smoking OR have quit w/in 15years.) does not qualify.   Lung Cancer Screening Referral: no  Additional Screening:  Hepatitis C Screening: does not qualify; Completed 02/07/2017  Vision Screening: Recommended annual ophthalmology exams for early detection of glaucoma and other disorders of the eye. Is the patient up to date with their annual eye exam?  Yes  Who is the provider or what is the  name of the office in which the patient attends annual eye exams? Dr.Lyles If pt is not established with a provider, would they like to be referred to a provider to establish care? No .   Dental Screening: Recommended annual dental exams for proper oral hygiene  Community Resource Referral / Chronic Care Management: CRR required this visit?  No   CCM required this visit?  No      Plan:     I have personally reviewed and noted the following in the patient's chart:   Medical and social history Use of alcohol, tobacco or illicit drugs  Current medications and supplements including opioid prescriptions. Patient is not currently taking opioid prescriptions. Functional ability and status Nutritional status Physical activity Advanced directives List of other physicians Hospitalizations, surgeries, and ER visits in previous 12 months Vitals Screenings to include cognitive, depression, and falls Referrals and appointments  In addition, I have reviewed and discussed with patient certain preventive protocols, quality metrics, and best practice recommendations. A written personalized care plan for preventive services as well as general preventive health recommendations were provided to patient.  Lebron Conners, LPN   [id number]   Nurse Notes: Pt declined covid vaccines.

## 2022-06-30 NOTE — Patient Instructions (Signed)
[name] , Thank you for taking time to come for your Medicare Wellness Visit. I appreciate your ongoing commitment to your health goals. Please review the following plan we discussed and let me know if I can assist you in the future.   These are the goals we discussed:  Goals      Increase physical activity     Starting 02/08/2018, I will continue to exercise for 30-45 minutes 3 days per week.      Patient Stated     Would like to eat healthier     Patient Stated     No new goals        This is a list of the screening recommended for you and due dates:  Health Maintenance  Topic Date Due   Zoster (Shingles) Vaccine (1 of 2) Never done   COVID-19 Vaccine (3 - Pfizer risk series) 08/27/2019   Medicare Annual Wellness Visit  07/01/2023   Colon Cancer Screening  02/11/2024   DTaP/Tdap/Td vaccine (3 - Td or Tdap) 03/01/2027   Pneumonia Vaccine  Completed   Flu Shot  Completed   Hepatitis C Screening: USPSTF Recommendation to screen - Ages 18-79 yo.  Completed   HPV Vaccine  Aged Out    Advanced directives: copy is in chart.  Conditions/risks identified: Aim for 30 minutes of exercise or brisk walking, 6-8 glasses of water, and 5 servings of fruits and vegetables each day.   Next appointment: Follow up in one year for your annual wellness visit. 07/06/2023 @ 10:15 am via telephone.  Preventive Care 76 Years and Older, Male  Preventive care refers to lifestyle choices and visits with your health care provider that can promote health and wellness. What does preventive care include? A yearly physical exam. This is also called an annual well check. Dental exams once or twice a year. Routine eye exams. Ask your health care provider how often you should have your eyes checked. Personal lifestyle choices, including: Daily care of your teeth and gums. Regular physical activity. Eating a healthy diet. Avoiding tobacco and drug use. Limiting alcohol use. Practicing safe  sex. Taking low doses of aspirin every day. Taking vitamin and mineral supplements as recommended by your health care provider. What happens during an annual well check? The services and screenings done by your health care provider during your annual well check will depend on your age, overall health, lifestyle risk factors, and family history of disease. Counseling  Your health care provider may ask you questions about your: Alcohol use. Tobacco use. Drug use. Emotional well-being. Home and relationship well-being. Sexual activity. Eating habits. History of falls. Memory and ability to understand (cognition). Work and work Statistician. Screening  You may have the following tests or measurements: Height, weight, and BMI. Blood pressure. Lipid and cholesterol levels. These may be checked every 5 years, or more frequently if you are over 76 years old. Skin check. Lung cancer screening. You may have this screening every year starting at age 26 if you have a 30-pack-year history of smoking and currently smoke or have quit within the past 15 years. Fecal occult blood test (FOBT) of the stool. You may have this test every year starting at age 76. Flexible sigmoidoscopy or colonoscopy. You may have a sigmoidoscopy every 5 years or a colonoscopy every 10 years starting at age 33. Prostate cancer screening. Recommendations will vary depending on your family history and other risks. Hepatitis C blood test. Hepatitis B blood test. Sexually transmitted  disease (STD) testing. Diabetes screening. This is done by checking your blood sugar (glucose) after you have not eaten for a while (fasting). You may have this done every 1-3 years. Abdominal aortic aneurysm (AAA) screening. You may need this if you are a current or former smoker. Osteoporosis. You may be screened starting at age 76 if you are at high risk. Talk with your health care provider about your test results, treatment options, and if  necessary, the need for more tests. Vaccines  Your health care provider may recommend certain vaccines, such as: Influenza vaccine. This is recommended every year. Tetanus, diphtheria, and acellular pertussis (Tdap, Td) vaccine. You may need a Td booster every 10 years. Zoster vaccine. You may need this after age 56. Pneumococcal 13-valent conjugate (PCV13) vaccine. One dose is recommended after age 14. Pneumococcal polysaccharide (PPSV23) vaccine. One dose is recommended after age 76. Talk to your health care provider about which screenings and vaccines you need and how often you need them. This information is not intended to replace advice given to you by your health care provider. Make sure you discuss any questions you have with your health care provider. Document Released: 05/22/2015 Document Revised: 01/13/2016 Document Reviewed: 02/24/2015 Elsevier Interactive Patient Education  2017 [place] Prevention in the Home Falls can cause injuries. They can happen to people of all ages. There are many things you can do to make your home safe and to help prevent falls. What can I do on the outside of my home? Regularly fix the edges of walkways and driveways and fix any cracks. Remove anything that might make you trip as you walk through a door, such as a raised step or threshold. Trim any bushes or trees on the path to your home. Use bright outdoor lighting. Clear any walking paths of anything that might make someone trip, such as rocks or tools. Regularly check to see if handrails are loose or broken. Make sure that both sides of any steps have handrails. Any raised decks and porches should have guardrails on the edges. Have any leaves, snow, or ice cleared regularly. Use sand or salt on walking paths during winter. Clean up any spills in your garage right away. This includes oil or grease spills. What can I do in the bathroom? Use night lights. Install grab bars by the toilet  and in the tub and shower. Do not use towel bars as grab bars. Use non-skid mats or decals in the tub or shower. If you need to sit down in the shower, use a plastic, non-slip stool. Keep the floor dry. Clean up any water that spills on the floor as soon as it happens. Remove soap buildup in the tub or shower regularly. Attach bath mats securely with double-sided non-slip rug tape. Do not have throw rugs and other things on the floor that can make you trip. What can I do in the bedroom? Use night lights. Make sure that you have a light by your bed that is easy to reach. Do not use any sheets or blankets that are too big for your bed. They should not hang down onto the floor. Have a firm chair that has side arms. You can use this for support while you get dressed. Do not have throw rugs and other things on the floor that can make you trip. What can I do in the kitchen? Clean up any spills right away. Avoid walking on wet floors. Keep items that you use a  lot in easy-to-reach places. If you need to reach something above you, use a strong step stool that has a grab bar. Keep electrical cords out of the way. Do not use floor polish or wax that makes floors slippery. If you must use wax, use non-skid floor wax. Do not have throw rugs and other things on the floor that can make you trip. What can I do with my stairs? Do not leave any items on the stairs. Make sure that there are handrails on both sides of the stairs and use them. Fix handrails that are broken or loose. Make sure that handrails are as long as the stairways. Check any carpeting to make sure that it is firmly attached to the stairs. Fix any carpet that is loose or worn. Avoid having throw rugs at the top or bottom of the stairs. If you do have throw rugs, attach them to the floor with carpet tape. Make sure that you have a light switch at the top of the stairs and the bottom of the stairs. If you do not have them, ask someone to add  them for you. What else can I do to help prevent falls? Wear shoes that: Do not have high heels. Have rubber bottoms. Are comfortable and fit you well. Are closed at the toe. Do not wear sandals. If you use a stepladder: Make sure that it is fully opened. Do not climb a closed stepladder. Make sure that both sides of the stepladder are locked into place. Ask someone to hold it for you, if possible. Clearly mark and make sure that you can see: Any grab bars or handrails. First and last steps. Where the edge of each step is. Use tools that help you move around (mobility aids) if they are needed. These include: Canes. Walkers. Scooters. Crutches. Turn on the lights when you go into a dark area. Replace any light bulbs as soon as they burn out. Set up your furniture so you have a clear path. Avoid moving your furniture around. If any of your floors are uneven, fix them. If there are any pets around you, be aware of where they are. Review your medicines with your doctor. Some medicines can make you feel dizzy. This can increase your chance of falling. Ask your doctor what other things that you can do to help prevent falls. This information is not intended to replace advice given to you by your health care provider. Make sure you discuss any questions you have with your health care provider. Document Released: 02/19/2009 Document Revised: 10/01/2015 Document Reviewed: 05/30/2014 Elsevier Interactive Patient Education  2017 [employer].

## 2022-07-07 DIAGNOSIS — Z96652 Presence of left artificial knee joint: Secondary | ICD-10-CM | POA: Diagnosis not present

## 2022-08-29 ENCOUNTER — Encounter: Payer: Self-pay | Admitting: Family Medicine

## 2022-08-29 ENCOUNTER — Ambulatory Visit (INDEPENDENT_AMBULATORY_CARE_PROVIDER_SITE_OTHER): Payer: PPO | Admitting: Family Medicine

## 2022-08-29 VITALS — BP 120/70 | HR 83 | Temp 97.8°F | Ht 71.0 in | Wt 202.0 lb

## 2022-08-29 DIAGNOSIS — R110 Nausea: Secondary | ICD-10-CM

## 2022-08-29 NOTE — Patient Instructions (Signed)
We may need to taper your blood pressure medicine.  I wouldn't change your meds yet.  Go to the lab on the way out.   If you have mychart we'll likely use that to update you.    Take care.  Glad to see you. Drink enough water to keep your urine clear or light colored.

## 2022-08-29 NOTE — Progress Notes (Unsigned)
"  Nausea and dizziness". Going on for about 1 week.  R frontal HA occ.    He was painting inside recently.  Hasn't painted in a few days and still had sx. No room spinning.  In retrospect, sx likely going on for a month but not escalating in severity but is happening more often.  Can last most of the day.  Eating doesn't help. Some dec in appetite. No vomiting. No fevers, no chills.  No abd pain.  Some abd bloating.  No burning with urination.  H/o renal stones.  No black or bloody stools except for occ rectal skin irritation/constipation.  No skin changes.    "Dizziness" is actually lightheadedness.  Some fatigue.  BP was similar to today at home.    Sx predate painting but could have been been worse with paint fume exposure.    Meds, vitals, and allergies reviewed.   ROS: Per HPI unless specifically indicated in ROS section   GEN: nad, alert and oriented HEENT: ncat NECK: supple w/o LA CV: rrr.  PULM: ctab, no inc wob ABD: soft, +bs EXT: no edema SKIN: no acute rash  Pulse 64----->74 on standing.  Mild sx at the time.

## 2022-08-30 LAB — CBC WITH DIFFERENTIAL/PLATELET
Basophils Absolute: 0.1 10*3/uL (ref 0.0–0.1)
Basophils Relative: 0.7 % (ref 0.0–3.0)
Eosinophils Absolute: 0.2 10*3/uL (ref 0.0–0.7)
Eosinophils Relative: 2.6 % (ref 0.0–5.0)
HCT: 42.7 % (ref 39.0–52.0)
Hemoglobin: 14.3 g/dL (ref 13.0–17.0)
Lymphocytes Relative: 31.7 % (ref 12.0–46.0)
Lymphs Abs: 2.4 10*3/uL (ref 0.7–4.0)
MCHC: 33.5 g/dL (ref 30.0–36.0)
MCV: 94.1 fl (ref 78.0–100.0)
Monocytes Absolute: 0.6 10*3/uL (ref 0.1–1.0)
Monocytes Relative: 7.8 % (ref 3.0–12.0)
Neutro Abs: 4.4 10*3/uL (ref 1.4–7.7)
Neutrophils Relative %: 57.2 % (ref 43.0–77.0)
Platelets: 239 10*3/uL (ref 150.0–400.0)
RBC: 4.54 Mil/uL (ref 4.22–5.81)
RDW: 12.9 % (ref 11.5–15.5)
WBC: 7.6 10*3/uL (ref 4.0–10.5)

## 2022-08-30 LAB — COMPREHENSIVE METABOLIC PANEL
ALT: 12 U/L (ref 0–53)
AST: 22 U/L (ref 0–37)
Albumin: 4.5 g/dL (ref 3.5–5.2)
Alkaline Phosphatase: 69 U/L (ref 39–117)
BUN: 19 mg/dL (ref 6–23)
CO2: 28 mEq/L (ref 19–32)
Calcium: 9.4 mg/dL (ref 8.4–10.5)
Chloride: 102 mEq/L (ref 96–112)
Creatinine, Ser: 1.11 mg/dL (ref 0.40–1.50)
GFR: 64.96 mL/min (ref >60.00)
Glucose, Bld: 128 mg/dL — ABNORMAL HIGH (ref 70–99)
Potassium: 4.5 mEq/L (ref 3.5–5.1)
Sodium: 137 mEq/L (ref 135–145)
Total Bilirubin: 0.8 mg/dL (ref 0.2–1.2)
Total Protein: 7.1 g/dL (ref 6.0–8.3)

## 2022-08-30 LAB — LIPASE: Lipase: 26 U/L (ref 11.0–59.0)

## 2022-08-31 ENCOUNTER — Other Ambulatory Visit: Payer: Self-pay | Admitting: Family Medicine

## 2022-08-31 DIAGNOSIS — R110 Nausea: Secondary | ICD-10-CM | POA: Insufficient documentation

## 2022-08-31 NOTE — Assessment & Plan Note (Signed)
With some lightheadedness and mild increase in pulse upon standing with mild return of symptoms.  No syncope.  Benign abdominal exam.  Will check routine labs today.  We may need to taper his amlodipine dose. I wouldn't change his meds yet.  Advised to drink enough water to keep urine clear or light colored.

## 2022-09-11 ENCOUNTER — Telehealth: Payer: Self-pay | Admitting: Family Medicine

## 2022-09-11 NOTE — Telephone Encounter (Signed)
Per report of patient's wife, patient clearly improved off amlodipine.  Would stay off that medication for now.

## 2022-10-10 ENCOUNTER — Encounter (HOSPITAL_BASED_OUTPATIENT_CLINIC_OR_DEPARTMENT_OTHER): Payer: Self-pay

## 2022-10-10 ENCOUNTER — Telehealth (HOSPITAL_BASED_OUTPATIENT_CLINIC_OR_DEPARTMENT_OTHER): Payer: Self-pay | Admitting: Cardiology

## 2022-10-10 DIAGNOSIS — I2510 Atherosclerotic heart disease of native coronary artery without angina pectoris: Secondary | ICD-10-CM

## 2022-10-10 MED ORDER — NITROGLYCERIN 0.4 MG SL SUBL
0.4000 mg | SUBLINGUAL_TABLET | SUBLINGUAL | 3 refills | Status: DC | PRN
Start: 2022-10-10 — End: 2022-10-10

## 2022-10-10 MED ORDER — NITROGLYCERIN 0.4 MG SL SUBL: 0.4000 mg | SUBLINGUAL_TABLET | SUBLINGUAL | 3 refills | Status: AC | PRN

## 2022-10-10 NOTE — Telephone Encounter (Signed)
Pt c/o medication issue:  1. Name of Medication:  nitroGLYCERIN (NITROSTAT) 0.4 MG SL tablet   2. How are you currently taking this medication (dosage and times per day)? N/A  3. Are you having a reaction (difficulty breathing--STAT)? N/A  4. What is your medication issue? Pharmacy and wholesaler are out of medication. Cristal Deer reports he tried contacting the patient to advise him of this, but did not get an answer and was unable to leave a VM.  He states he believes [facility] is still able to get the medication. Please advise.

## 2022-10-10 NOTE — Telephone Encounter (Signed)
Spoke with wife and sent Rx to [facility] Called and advised pharmacy

## 2022-10-26 ENCOUNTER — Telehealth: Payer: Self-pay | Admitting: Cardiology

## 2022-10-26 NOTE — Telephone Encounter (Signed)
Patient had virtual visit last year, advised needed to be seen in office this year

## 2022-10-26 NOTE — Telephone Encounter (Signed)
Pt would like a callback regarding follow up appointment on 9/25. Would like to know if it could be a video visit instead of a office visit. Please advise.

## 2022-10-28 DIAGNOSIS — Z961 Presence of intraocular lens: Secondary | ICD-10-CM | POA: Diagnosis not present

## 2022-10-28 DIAGNOSIS — H26493 Other secondary cataract, bilateral: Secondary | ICD-10-CM | POA: Diagnosis not present

## 2022-10-28 DIAGNOSIS — H35372 Puckering of macula, left eye: Secondary | ICD-10-CM | POA: Diagnosis not present

## 2022-12-27 ENCOUNTER — Other Ambulatory Visit (HOSPITAL_BASED_OUTPATIENT_CLINIC_OR_DEPARTMENT_OTHER): Payer: Self-pay | Admitting: Cardiology

## 2022-12-27 NOTE — Telephone Encounter (Signed)
Rx request sent to pharmacy.  

## 2023-01-30 ENCOUNTER — Other Ambulatory Visit: Payer: Self-pay | Admitting: Family Medicine

## 2023-02-01 ENCOUNTER — Encounter (HOSPITAL_BASED_OUTPATIENT_CLINIC_OR_DEPARTMENT_OTHER): Payer: Self-pay | Admitting: Cardiology

## 2023-02-01 ENCOUNTER — Ambulatory Visit (HOSPITAL_BASED_OUTPATIENT_CLINIC_OR_DEPARTMENT_OTHER): Payer: PPO | Admitting: Cardiology

## 2023-02-01 VITALS — BP 136/76 | HR 60 | Ht 71.0 in | Wt 202.0 lb

## 2023-02-01 DIAGNOSIS — I2584 Coronary atherosclerosis due to calcified coronary lesion: Secondary | ICD-10-CM

## 2023-02-01 DIAGNOSIS — E7800 Pure hypercholesterolemia, unspecified: Secondary | ICD-10-CM

## 2023-02-01 DIAGNOSIS — I2510 Atherosclerotic heart disease of native coronary artery without angina pectoris: Secondary | ICD-10-CM

## 2023-02-01 DIAGNOSIS — Z7189 Other specified counseling: Secondary | ICD-10-CM | POA: Diagnosis not present

## 2023-02-01 DIAGNOSIS — I10 Essential (primary) hypertension: Secondary | ICD-10-CM

## 2023-02-01 NOTE — Progress Notes (Signed)
Cardiology Office Note:  .    Date:  02/01/2023  ID:  Trevor Ross, DOB 1945-12-09, MRN [id number] PCP: Joaquim Nam, MD  [facility] Providers Cardiologist:  Jodelle Red, MD     History of Present Illness: .    Trevor Ross is a 77 y.o. male with a hx of arthritis, vascular calcification who is seen for follow up today. I initially met him 01/08/20 as a new consult at the request of Joaquim Nam, MD for the evaluation and management of possible coronary artery disease and chest pain.   At his virtual visit 01/2022, he complained of rare arm/chest pain only with lifting and primarily in his shoulder. Consistent with known chronic arthritis in his shoulder. He also complained of LE edema, R>L. Reviewed prior note from Dr. Al Corpus in podiatry. Thought to be chronic venous insufficiency. Reviewed recommendations for compression, elevation; gave info for custom compression stocking service. LE venous reflux study was performed 02/2022 revealing no evidence of DVT or venous reflux.  Today, he is accompanied by his wife. He states he is feeling good overall. Occasionally he has right sided chest pain that occurs while sitting in his chair, eventually subsides on its own. He has noticed that his heart beat seems abnormal at times, but normalizes after focusing on his breathing.  This past Winter he was wearing compression stockings which helped manage his swelling. Has not been able to wear them in the Summer with the hot weather but his swelling seems to have remained stable.  Sometimes he may feel a little weak or fatigued, but is mainly limited by his knees. Recent strenuous activity includes mowing the lawn. Previously he was walking 1 mile per day for a while prior to developing worsening left hip pain, now stable.  Previously stopped using his nasal spray with subsequent improvement in his dizziness.   He denies any shortness of breath, headaches, syncope, orthopnea,  or PND.  ROS:  Please see the history of present illness. ROS otherwise negative except as noted.  (+) Occasional right-sided chest pain (+) Intermittent weakness/fatigue  Studies Reviewed: Marland Kitchen    EKG Interpretation Date/Time:  Wednesday February 01 2023 15:47:19 EDT Ventricular Rate:  65 PR Interval:  202 QRS Duration:  88 QT Interval:  392 QTC Calculation: 407 R Axis:   36  Text Interpretation: Normal sinus rhythm Normal ECG Confirmed by Jodelle Red [phone]) on 02/01/2023 4:03:07 PM    LE Venous Reflux Study  03/01/2022: Summary:  Right:  - No evidence of deep vein thrombosis seen in the right lower extremity,  from the common femoral through the popliteal veins.  - No evidence of superficial venous thrombosis in the right lower  extremity.  - No evidence of superficial venous reflux seen in the right greater  saphenous vein.  - No evidence of superficial venous reflux seen in the right short  saphenous vein.   Physical Exam:    VS:  BP 136/76   Pulse 60   Ht 5\' 11"  (1.803 m)   Wt 202 lb (91.6 kg)   SpO2 97%   BMI 28.17 kg/m    Wt Readings from Last 3 Encounters:  02/01/23 202 lb (91.6 kg)  08/29/22 202 lb (91.6 kg)  06/30/22 198 lb (89.8 kg)    GEN: Well nourished, well developed in no acute distress HEENT: Normal, moist mucous membranes NECK: No JVD CARDIAC: regular rhythm, normal S1 and S2, no rubs or gallops. No murmur. VASCULAR: Radial  and DP pulses 2+ bilaterally. No carotid bruits RESPIRATORY:  Clear to auscultation without rales, wheezing or rhonchi  ABDOMEN: Soft, non-tender, non-distended MUSCULOSKELETAL:  Ambulates independently SKIN: Warm and dry, no edema NEUROLOGIC:  Alert and oriented x 3. No focal neuro deficits noted. PSYCHIATRIC:  Normal affect   ASSESSMENT AND PLAN: .    CAD without angina Vascular calcification Family history of heart disease -CTshows elevated calcium score and moderate CAD, but FFR was negative for flow  limiting obstruction -he is now on aspirin 81 mg daily and rosuvastatin 20 mg daily -has PRN SL NG, understands how to use and risk with PDE5i -now has rare atypical (MSK) pain -counseled on red flag warning signs that need immediate medical attention   Hypertension -systolic slightly elevated -was on amlodipine 2.5 mg previously, stopped by Dr. Para March in 09/2022, patient has less lightheadedness -reviewed checking BP twice/week, contact me if consistently >140/90 and we will need to reconsider meds   Hypercholesterolemia -continue rosuvastatin 20 mg daily given CAD, vascular calcification -last LDL 53, at goal of <70   Cardiac risk counseling and prevention recommendations: -recommend heart healthy/Mediterranean diet, with whole grains, fruits, vegetable, fish, lean meats, nuts, and olive oil. Limit salt. -recommend moderate walking, 3-5 times/week for 30-50 minutes each session. Aim for at least 150 minutes/week. Goal should be pace of 3 miles/hours, or walking 1.5 miles in 30 minutes -recommend avoidance of tobacco products. Avoid excess alcohol.   Dispo: Follow-up in 1 year, or sooner as needed.  I,Mathew Stumpf,acting as a Neurosurgeon for [employer], MD.,have documented all relevant documentation on the behalf of Jodelle Red, MD,as directed by  Jodelle Red, MD while in the presence of Jodelle Red, MD.  I, Jodelle Red, MD, have reviewed all documentation for this visit. The documentation on 02/01/23 for the exam, diagnosis, procedures, and orders are all accurate and complete.   Signed, Jodelle Red, MD

## 2023-02-01 NOTE — Patient Instructions (Signed)
Medication Instructions:  Your physician recommends that you continue on your current medications as directed. Please refer to the Current Medication list given to you today.  Follow-Up: At [facility], you and your health needs are our priority.  As part of our continuing mission to provide you with exceptional heart care, we have created designated Provider Care Teams.  These Care Teams include your primary Cardiologist (physician) and Advanced Practice Providers (APPs -  Physician Assistants and Nurse Practitioners) who all work together to provide you with the care you need, when you need it.  We recommend signing up for the patient portal called "MyChart".  Sign up information is provided on this After Visit Summary.  MyChart is used to connect with patients for Virtual Visits (Telemedicine).  Patients are able to view lab/test results, encounter notes, upcoming appointments, etc.  Non-urgent messages can be sent to your provider as well.   To learn more about what you can do with MyChart, go to [email or web address].    Your next appointment:   1 year with Dr. Cristal Deer

## 2023-02-08 DIAGNOSIS — R3912 Poor urinary stream: Secondary | ICD-10-CM | POA: Diagnosis not present

## 2023-02-08 DIAGNOSIS — R3915 Urgency of urination: Secondary | ICD-10-CM | POA: Diagnosis not present

## 2023-02-08 DIAGNOSIS — N200 Calculus of kidney: Secondary | ICD-10-CM | POA: Diagnosis not present

## 2023-03-24 ENCOUNTER — Other Ambulatory Visit (HOSPITAL_BASED_OUTPATIENT_CLINIC_OR_DEPARTMENT_OTHER): Payer: Self-pay | Admitting: Cardiology

## 2023-04-17 ENCOUNTER — Encounter (HOSPITAL_COMMUNITY): Payer: Self-pay | Admitting: Emergency Medicine

## 2023-04-17 ENCOUNTER — Other Ambulatory Visit: Payer: Self-pay

## 2023-04-17 ENCOUNTER — Emergency Department (HOSPITAL_COMMUNITY)
Admission: EM | Admit: 2023-04-17 | Discharge: 2023-04-17 | Disposition: A | Discharge status: Home / Self Care | Payer: PPO | Attending: Emergency Medicine | Admitting: Emergency Medicine

## 2023-04-17 ENCOUNTER — Emergency Department (HOSPITAL_COMMUNITY): Payer: PPO

## 2023-04-17 DIAGNOSIS — R0789 Other chest pain: Secondary | ICD-10-CM | POA: Diagnosis not present

## 2023-04-17 DIAGNOSIS — R079 Chest pain, unspecified: Secondary | ICD-10-CM | POA: Diagnosis not present

## 2023-04-17 DIAGNOSIS — Z79899 Other long term (current) drug therapy: Secondary | ICD-10-CM | POA: Insufficient documentation

## 2023-04-17 DIAGNOSIS — R110 Nausea: Secondary | ICD-10-CM | POA: Diagnosis not present

## 2023-04-17 DIAGNOSIS — R42 Dizziness and giddiness: Secondary | ICD-10-CM | POA: Insufficient documentation

## 2023-04-17 DIAGNOSIS — R519 Headache, unspecified: Secondary | ICD-10-CM | POA: Insufficient documentation

## 2023-04-17 DIAGNOSIS — I2510 Atherosclerotic heart disease of native coronary artery without angina pectoris: Secondary | ICD-10-CM | POA: Diagnosis not present

## 2023-04-17 DIAGNOSIS — I10 Essential (primary) hypertension: Secondary | ICD-10-CM | POA: Diagnosis not present

## 2023-04-17 LAB — BASIC METABOLIC PANEL
Anion gap: 11 (ref 5–15)
BUN: 17 mg/dL (ref 8–23)
CO2: 23 mmol/L (ref 22–32)
Calcium: 9.6 mg/dL (ref 8.9–10.3)
Chloride: 101 mmol/L (ref 98–111)
Creatinine, Ser: 1.06 mg/dL (ref 0.61–1.24)
GFR, Estimated: >60 mL/min (ref >60)
Glucose, Bld: 113 mg/dL — ABNORMAL HIGH (ref 70–99)
Potassium: 4.3 mmol/L (ref 3.5–5.1)
Sodium: 135 mmol/L (ref 135–145)

## 2023-04-17 LAB — CBC
HCT: 43.8 % (ref 39.0–52.0)
Hemoglobin: 14.7 g/dL (ref 13.0–17.0)
MCH: 31.1 pg (ref 26.0–34.0)
MCHC: 33.6 g/dL (ref 30.0–36.0)
MCV: 92.8 fL (ref 80.0–100.0)
Platelets: 266 10*3/uL (ref 150–400)
RBC: 4.72 MIL/uL (ref 4.22–5.81)
RDW: 12.1 % (ref 11.5–15.5)
WBC: 9.5 10*3/uL (ref 4.0–10.5)
nRBC: 0 % (ref 0.0–0.2)

## 2023-04-17 LAB — TROPONIN I (HIGH SENSITIVITY)
Troponin I (High Sensitivity): 6 ng/L (ref <18)
Troponin I (High Sensitivity): 7 ng/L (ref <18)

## 2023-04-17 MED ORDER — ONDANSETRON 4 MG PO TBDP
4.0000 mg | ORAL_TABLET | Freq: Once | ORAL | Status: AC
  Administered 2023-04-17: 4 mg via ORAL
  Filled 2023-04-17: qty 1

## 2023-04-17 MED ORDER — ACETAMINOPHEN 500 MG PO TABS
1000.0000 mg | ORAL_TABLET | Freq: Once | ORAL | Status: AC
  Administered 2023-04-17: 1000 mg via ORAL
  Filled 2023-04-17: qty 2

## 2023-04-17 NOTE — ED Provider Triage Note (Signed)
Emergency Medicine Provider Triage Evaluation Note  Trevor Ross , a 77 y.o. male  was evaluated in triage.  Pt complains of chest pain that started about 2:00 today.  He was working in his shop when he developed a squeezing sensation in the center of his chest that made him feel a little lightheaded and short of breath.  Lasted approximately 5 minutes and went away after he took 1 nitroglycerin.  Review of Systems  Positive: Chest pain Negative: Cough, fever, vomiting, abd pain, leg swelling  Physical Exam  BP (!) 143/79 (BP Location: Left Arm)   Pulse 63   Temp 97.8 F (36.6 C) (Oral)   Resp (!) 26   SpO2 100%  Gen:   Awake, no distress   Resp:  Normal effort  MSK:   Moves extremities without difficulty  Other:  Cardiac RRR  Medical Decision Making  Medically screening exam initiated at 5:32 PM.  Appropriate orders placed.  Trevor Ross was informed that the remainder of the evaluation will be completed by another provider, this initial triage assessment does not replace that evaluation, and the importance of remaining in the ED until their evaluation is complete.     Trevor Sprout, MD 04/17/23 [phone]

## 2023-04-17 NOTE — Discharge Instructions (Addendum)
All your blood test, EKG and x-ray were normal today.  No signs of a heart attack however it will be important for you to follow-up with Dr. Cristal Deer as you may need some more testing.  Also keep an eye on your blood pressure at home and write down the number every few days and take it with you to your appointment.  If in the meantime you develop recurrent pain that is constant, shortness of breath, feeling like you are going to pass out, confusion or other concerns return to the emergency room.

## 2023-04-17 NOTE — ED Provider Notes (Signed)
[place] EMERGENCY DEPARTMENT AT [facility] Provider Note   CSN: [id number] Arrival date & time: 04/17/23  1635     History  Chief Complaint  Patient presents with   Chest Pain    Trevor Ross is a 77 y.o. male.  Patient is a 77 year old male with a history of hypertension, hyperlipidemia, CAD without intervention, hiatal hernia who is presenting today with complaints of chest pain as well as some headache and dizziness.  Patient reports that he has had a headache intermittently and some dizziness that he describes as being a little off balance today and it caused some nausea when he developed some dull chest tightness in the center of his chest around 1 or 2:00.  He reports the symptoms lasted for 5 to 10 minutes and made him feel a slight shortness of breath.  He took a nitroglycerin and reported his symptoms significantly improved.  He felt that he could get up and walk easily after that and the pain has not returned.  Patient reports he still has a bit of a headache and some nausea but denies any unilateral numbness or weakness.  No difficulty walking beyond what he already has because he has bad knees.  He does report being taken off his amlodipine approximately 6 months ago but had been doing fairly well and felt that his blood pressure had been okay since discontinuing the amlodipine.  He reports that episode of this dizziness and headache approximately a week ago as well but it resolved without issues.  The history is provided by the patient, the spouse and medical records.  Chest Pain      Home Medications Prior to Admission medications   Medication Sig Start Date End Date Taking? Authorizing Provider  aspirin EC 81 MG tablet Take 1 tablet (81 mg total) by mouth daily. Swallow whole. 01/08/20   Jodelle Red, MD  fluticasone [facility]) 50 MCG/ACT nasal spray PLACE 1 SPRAY INTO BOTH NOSTRILS 2 TIMESDAILY 01/19/22   Joaquim Nam, MD  melatonin 5 MG TABS  Take 1 tablet (5 mg total) by mouth at bedtime. 03/29/22   Eustaquio Boyden, MD  meloxicam (MOBIC) 15 MG tablet TAKE ONE TABLET BY MOUTH ONCE A DAY WITH MEALS. 11/04/22   [provider]  multivitamin-lutein (OCUVITE-LUTEIN) CAPS capsule Take 1 capsule by mouth daily.    [provider]  nitroGLYCERIN (NITROSTAT) 0.4 MG SL tablet Place 1 tablet (0.4 mg total) under the tongue every 5 (five) minutes as needed for chest pain (max 3 doses). 10/10/22 10/10/23  Jodelle Red, MD  omeprazole (PRILOSEC) 20 MG capsule TAKE TWO CAPSULES BY MOUTH DAILY 01/31/23   Joaquim Nam, MD  rosuvastatin (CRESTOR) 20 MG tablet TAKE ONE TABLET (20 MG TOTAL) BY MOUTH DAILY. PLEASE KEEP YOUR UPCOMING APPOINTMENT FOR REFILLS. 03/24/23   Jodelle Red, MD  sildenafil (VIAGRA) 50 MG tablet Take 50 mg by mouth daily as needed for erectile dysfunction.    [provider]      Allergies    Patient has no known allergies.    Review of Systems   Review of Systems  Cardiovascular:  Positive for chest pain.    Physical Exam Updated Vital Signs BP (!) 151/79   Pulse 63   Temp 97.9 F (36.6 C) (Oral)   Resp 17   SpO2 98%  Physical Exam Vitals and nursing note reviewed.  Constitutional:      General: He is not in acute distress.  Appearance: He is well-developed.  HENT:     Head: Normocephalic and atraumatic.  Eyes:     Conjunctiva/sclera: Conjunctivae normal.     Pupils: Pupils are equal, round, and reactive to light.  Cardiovascular:     Rate and Rhythm: Normal rate and regular rhythm.     Heart sounds: No murmur heard. Pulmonary:     Effort: Pulmonary effort is normal. No respiratory distress.     Breath sounds: Normal breath sounds. No wheezing or rales.  Abdominal:     General: There is no distension.     Palpations: Abdomen is soft.     Tenderness: There is no abdominal tenderness. There is no guarding or rebound.  Musculoskeletal:        General: No  tenderness. Normal range of motion.     Cervical back: Normal range of motion and neck supple.     Right lower leg: No edema.     Left lower leg: No edema.  Skin:    General: Skin is warm and dry.     Findings: No erythema or rash.  Neurological:     Mental Status: He is alert and oriented to person, place, and time. Mental status is at baseline.     Sensory: No sensory deficit.     Motor: No weakness.     Gait: Gait normal.  Psychiatric:        Behavior: Behavior normal.     ED Results / Procedures / Treatments   Labs (all labs ordered are listed, but only abnormal results are displayed) Labs Reviewed  BASIC METABOLIC PANEL - Abnormal; Notable for the following components:      Result Value   Glucose, Bld 113 (*)    All other components within normal limits  CBC  TROPONIN I (HIGH SENSITIVITY)  TROPONIN I (HIGH SENSITIVITY)    EKG EKG Interpretation Date/Time:  Monday April 17 2023 16:59:56 EST Ventricular Rate:  67 PR Interval:  204 QRS Duration:  92 QT Interval:  406 QTC Calculation: 429 R Axis:   41  Text Interpretation: Normal sinus rhythm Normal ECG When compared with ECG of 01-Feb-2023 15:47, PREVIOUS ECG IS PRESENT No significant change since last tracing Confirmed by Gwyneth Sprout ([id number]) on 04/17/2023 5:45:09 PM  Radiology DG Chest 2 View  Result Date: 04/17/2023 CLINICAL DATA:  Chest pain EXAM: CHEST - 2 VIEW COMPARISON:  04/14/2020 chest radiograph. FINDINGS: Stable cardiomediastinal silhouette with normal heart size. No pneumothorax. No pleural effusion. Lungs appear clear, with no acute consolidative airspace disease and no pulmonary edema. IMPRESSION: No active cardiopulmonary disease. Electronically Signed   By: Delbert Phenix M.D.   On: 04/17/2023 18:43    Procedures Procedures    Medications Ordered in ED Medications  acetaminophen (TYLENOL) tablet 1,000 mg (1,000 mg Oral Given 04/17/23 1835)  ondansetron (ZOFRAN-ODT) disintegrating tablet 4  mg (4 mg Oral Given 04/17/23 1835)    ED Course/ Medical Decision Making/ A&P                                 Medical Decision Making Amount and/or Complexity of Data Reviewed External Data Reviewed: notes. Labs: ordered. Decision-making details documented in ED Course. Radiology: ordered and independent interpretation performed. Decision-making details documented in ED Course. ECG/medicine tests: ordered and independent interpretation performed. Decision-making details documented in ED Course.  Risk OTC drugs. Prescription drug management.   Pt with multiple medical problems and comorbidities  and presenting today with a complaint that caries a high risk for morbidity and mortality.  Here today with complaint of nonspecific chest pain.  Also in the setting of having a headache some dizziness and nausea.  Patient does report that the headache dizziness and nausea started first and then the pain.  However patient did take a nitroglycerin at home and reports symptoms resolved.  He has not had any return of his chest pain and chest pain was greater than 6 hours ago.  Low suspicion for pericardial effusion, myocarditis, PE, dissection.  Need to rule out ACS or STEMI.  Low suspicion for musculoskeletal at this time.  Also possibility this could be related to his hiatal hernia and esophageal spasm given he had nausea and dizziness prior to this episode.  Low suspicion for dysrhythmia.  Patient denies any symptoms consistent with LOC and reports more of an off-balance sensation in his head more typical of vertigo.  He also reports a history of vertigo in the past and had a similar episode last week but no chest discomfort with it. Patient does see Dr. Cristal Deer does have history of CAD but has never required any intervention.  Last testing was done in 2021. I independently interpreted patient's labs and EKG.  EKG was normal today without evidence of dysrhythmia or ST changes.  BMP, CBC, troponin x 2  are all within normal limits.  I have independently visualized and interpreted pt's images today. Chest x-ray is normal here.  No findings to suggest ACS today however with patient's symptoms do feel it would be reasonable for him to follow-up with his cardiologist as he has never had pain quite like that prior.  Also no findings today to suggest stroke.  Patient otherwise well-appearing at this time.  No indication for admission or further testing.  Will continue outpatient workup but given return precautions.  He and his family are comfortable with this plan.        Final Clinical Impression(s) / ED Diagnoses Final diagnoses:  Nonspecific chest pain    Rx / DC Orders ED Discharge Orders          Ordered    Ambulatory referral to Cardiology        04/17/23 2028              Gwyneth Sprout, MD 04/17/23 2055

## 2023-04-17 NOTE — ED Notes (Signed)
Patient verbalizes understanding of discharge instructions. Opportunity for questioning and answers were provided. Armband removed by staff, pt discharged from ED. Pt taken to ED waiting room via wheel chair.  

## 2023-04-17 NOTE — ED Triage Notes (Signed)
Pt bib [facility] with reports of CP that started at 1200 noon today. Pt reports he has taken 1 nitroglycerin with relief of the chest pain.

## 2023-04-18 ENCOUNTER — Telehealth: Payer: Self-pay | Admitting: Cardiology

## 2023-04-18 NOTE — Telephone Encounter (Signed)
Pt's wife would like to know if the pt can take any medications other than Tylenol before his appt on the 17th. Please advise

## 2023-04-18 NOTE — Telephone Encounter (Signed)
Advised wife, verbalized understanding Seeing PCP office tomorrow

## 2023-04-18 NOTE — Telephone Encounter (Signed)
ED workup reassuring. Chest pain with mixed features - atypical as started with nausea but typical as resolved with nitroglycerin. Okay to follow up as scheduled 04/25/23. Recommend he contact us if he has recurrent symptoms.   Alver Sorrow, NP

## 2023-04-18 NOTE — Telephone Encounter (Signed)
Spoke with wife, discussed events since yesterday Patient came into house stating he didn't feel well Was feeling lightheaded, nauseous, headache, and chest pressure in middle of chest.  She gave him NTG and pain relieved within 5 minutes Friend works at [employer], came and EKG ok but blood pressure little high Decided to go to ED for evaluation  Has follow up 12/17 Today has headache and nausea Denies any shortness of breath, chest pain/pressure, radiating pain, cough, or congestion.  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

## 2023-04-19 ENCOUNTER — Encounter: Payer: Self-pay | Admitting: Family Medicine

## 2023-04-19 ENCOUNTER — Ambulatory Visit (INDEPENDENT_AMBULATORY_CARE_PROVIDER_SITE_OTHER): Payer: PPO | Admitting: Family Medicine

## 2023-04-19 VITALS — BP 148/82 | HR 64 | Temp 98.1°F | Ht 71.0 in | Wt 205.4 lb

## 2023-04-19 DIAGNOSIS — R42 Dizziness and giddiness: Secondary | ICD-10-CM

## 2023-04-19 DIAGNOSIS — R0789 Other chest pain: Secondary | ICD-10-CM

## 2023-04-19 DIAGNOSIS — R1013 Epigastric pain: Secondary | ICD-10-CM | POA: Diagnosis not present

## 2023-04-19 DIAGNOSIS — R110 Nausea: Secondary | ICD-10-CM

## 2023-04-19 LAB — COMPREHENSIVE METABOLIC PANEL
ALT: 12 U/L (ref 0–53)
AST: 18 U/L (ref 0–37)
Albumin: 4.5 g/dL (ref 3.5–5.2)
Alkaline Phosphatase: 61 U/L (ref 39–117)
BUN: 14 mg/dL (ref 6–23)
CO2: 30 meq/L (ref 19–32)
Calcium: 9.4 mg/dL (ref 8.4–10.5)
Chloride: 103 meq/L (ref 96–112)
Creatinine, Ser: 1.09 mg/dL (ref 0.40–1.50)
GFR: 66.1 mL/min (ref >60.00)
Glucose, Bld: 108 mg/dL — ABNORMAL HIGH (ref 70–99)
Potassium: 4.5 meq/L (ref 3.5–5.1)
Sodium: 139 meq/L (ref 135–145)
Total Bilirubin: 1.1 mg/dL (ref 0.2–1.2)
Total Protein: 7.2 g/dL (ref 6.0–8.3)

## 2023-04-19 LAB — LIPASE: Lipase: 15 U/L (ref 11.0–59.0)

## 2023-04-19 NOTE — Progress Notes (Signed)
Patient ID: DJ MCCRANIE, male    DOB: 1946/05/02, 77 y.o.   MRN: [id number]  This visit was conducted in person.  BP (!) 148/82   Pulse 64   Temp 98.1 F (36.7 C) (Oral)   Ht 5\' 11"  (1.803 m)   Wt 205 lb 6.4 oz (93.2 kg)   SpO2 97%   BMI 28.65 kg/m    CC:  Chief Complaint  Patient presents with   Headache   Nausea    Both issues have been ongoing since Monday and its been consistent. Nausea is more worrisome than the headache.  Recently seen in the ED on 12/9   Insect Bite    Patient had an insect bite a few weeks ago and his right arm is itchy     Subjective:   HPI: Trevor Ross is a 77 y.o. male patient of Dr. Lianne Bushy with HTN, HLD, CAD, OSA, GERD presenting on 04/19/2023 for Headache, Nausea (Both issues have been ongoing since Monday and its been consistent. Nausea is more worrisome than the headache.  Recently seen in the ED on 12/9), and Insect Bite (Patient had an insect bite a few weeks ago and his right arm is itchy )   New onset headache associated with nausea.  Reviewed recent ED visit on 04/17/2023 with CP, headache and dizziness.  HPI from ED: Patient reports that he has had a headache intermittently and some dizziness that he describes as being a little off balance today and it caused some nausea when he developed some dull chest tightness in the center of his chest around 1 or 2:00. He reports the symptoms lasted for 5 to 10 minutes and made him feel a slight shortness of breath. He took a nitroglycerin and reported his symptoms significantly improved. He felt that he could get up and walk easily after that and the pain has not returned  Workup included unremarkable EKG, c-Met, CBC and troponin x 2 within normal limits. Chest x-ray clear. Felt symptoms may be secondary to vertigo, atypical chest pain recommended follow-up with cardiologist.... Has appointment April 25, 2023.  Today he reports  started feeling bad working on an old [employer].... started with   nasusea and  swimmy headedness... describes as presyncope, lightheadedness, not vertigo.  Doing more burping.  Felt chest pressure.. took nitroglycerin... felt immediately better.... except new headache.  Nausea returned, chest pressure did not return.  Called EMT.Marland Kitchen 5 baby aspirin.   No further chest pressure... did belch today and small amount of chest pressure resolved.  No heartburn, no acid in throat. Occ pain in epigastrum  Nausea constant... better with lying down.  NO V, D,  Drinking and eating minimally... some peidalyte.  No fever.  No joint pain. No dysuria.  Using prilosec 20 mg daily.  Is using meloxicam.. every other day couple week ago.  Big bowl of chili night before symptoms.  He also has noted several areas of itchiness, possible insect bite several weeks ago.  Since then itchiness on  right forearm.. swelling and raised are resolved.      Relevant past medical, surgical, family and social history reviewed and updated as indicated. Interim medical history since our last visit reviewed. Allergies and medications reviewed and updated. Outpatient Medications Prior to Visit  Medication Sig Dispense Refill   tamsulosin (FLOMAX) 0.4 MG CAPS capsule Take 0.4 mg by mouth daily after breakfast.     aspirin EC 81 MG tablet Take 1 tablet (81 mg total)  by mouth daily. Swallow whole. 90 tablet 3   fluticasone (FLONASE) 50 MCG/ACT nasal spray PLACE 1 SPRAY INTO BOTH NOSTRILS 2 TIMESDAILY 16 g 5   melatonin 5 MG TABS Take 1 tablet (5 mg total) by mouth at bedtime. 30 tablet 0   meloxicam (MOBIC) 15 MG tablet TAKE ONE TABLET BY MOUTH ONCE A DAY WITH MEALS.     multivitamin-lutein (OCUVITE-LUTEIN) CAPS capsule Take 1 capsule by mouth daily.     nitroGLYCERIN (NITROSTAT) 0.4 MG SL tablet Place 1 tablet (0.4 mg total) under the tongue every 5 (five) minutes as needed for chest pain (max 3 doses). 25 tablet 3   omeprazole (PRILOSEC) 20 MG capsule TAKE TWO CAPSULES BY MOUTH DAILY 180  capsule 1   rosuvastatin (CRESTOR) 20 MG tablet TAKE ONE TABLET (20 MG TOTAL) BY MOUTH DAILY. PLEASE KEEP YOUR UPCOMING APPOINTMENT FOR REFILLS. 90 tablet 3   sildenafil (VIAGRA) 50 MG tablet Take 50 mg by mouth daily as needed for erectile dysfunction.     No facility-administered medications prior to visit.     Per HPI unless specifically indicated in ROS section below Review of Systems  Constitutional:  Negative for fatigue and fever.  HENT:  Negative for ear pain.   Eyes:  Negative for pain.  Respiratory:  Negative for cough and shortness of breath.   Cardiovascular:  Negative for chest pain, palpitations and leg swelling.  Gastrointestinal:  Negative for abdominal pain.  Genitourinary:  Negative for dysuria.  Musculoskeletal:  Negative for arthralgias.  Neurological:  Negative for syncope, light-headedness and headaches.  Psychiatric/Behavioral:  Negative for dysphoric mood.    Objective:  BP (!) 148/82   Pulse 64   Temp 98.1 F (36.7 C) (Oral)   Ht 5\' 11"  (1.803 m)   Wt 205 lb 6.4 oz (93.2 kg)   SpO2 97%   BMI 28.65 kg/m   Wt Readings from Last 3 Encounters:  04/19/23 205 lb 6.4 oz (93.2 kg)  02/01/23 202 lb (91.6 kg)  08/29/22 202 lb (91.6 kg)      Physical Exam Constitutional:      Appearance: He is well-developed.  HENT:     Head: Normocephalic.     Right Ear: Hearing normal.     Left Ear: Hearing normal.     Nose: Nose normal.  Neck:     Thyroid: No thyroid mass or thyromegaly.     Vascular: No carotid bruit.     Trachea: Trachea normal.  Cardiovascular:     Rate and Rhythm: Normal rate and regular rhythm.     Pulses: Normal pulses.     Heart sounds: Heart sounds not distant. No murmur heard.    No friction rub. No gallop.     Comments: No peripheral edema Pulmonary:     Effort: Pulmonary effort is normal. No respiratory distress.     Breath sounds: Normal breath sounds.  Chest:     Chest wall: No tenderness.  Skin:    General: Skin is warm and  dry.     Findings: No rash.  Psychiatric:        Speech: Speech normal.        Behavior: Behavior normal.        Thought Content: Thought content normal.       Results for orders placed or performed during the hospital encounter of 04/17/23  Basic metabolic panel  Result Value Ref Range   Sodium 135 135 - 145 mmol/L   Potassium 4.3 3.5 -  5.1 mmol/L   Chloride 101 98 - 111 mmol/L   CO2 23 22 - 32 mmol/L   Glucose, Bld 113 (H) 70 - 99 mg/dL   BUN 17 8 - 23 mg/dL   Creatinine, Ser 9.14 0.61 - 1.24 mg/dL   Calcium 9.6 8.9 - 78.2 mg/dL   GFR, Estimated >95 >62 mL/min   Anion gap 11 5 - 15  CBC  Result Value Ref Range   WBC 9.5 4.0 - 10.5 K/uL   RBC 4.72 4.22 - 5.81 MIL/uL   Hemoglobin 14.7 13.0 - 17.0 g/dL   HCT 13.0 86.5 - 78.4 %   MCV 92.8 80.0 - 100.0 fL   MCH 31.1 26.0 - 34.0 pg   MCHC 33.6 30.0 - 36.0 g/dL   RDW 69.6 29.5 - 28.4 %   Platelets 266 150 - 400 K/uL   nRBC 0.0 0.0 - 0.2 %  Troponin I (High Sensitivity)  Result Value Ref Range   Troponin I (High Sensitivity) 6 <18 ng/L  Troponin I (High Sensitivity)  Result Value Ref Range   Troponin I (High Sensitivity) 7 <18 ng/L    Assessment and Plan  Epigastric pain -     Comprehensive metabolic panel -     Lipase  Nausea  Lightheaded  Chest pressure     So far unremarkable cardiopulmonary work up in ER.  Pt with history of moderate CAD... Symptoms seem fairly atypical for MI although did improve significantly with nitroglycerin.  He and his wife are in contact with Dr. Cristal Deer, cardiology.  He has upcoming appointment December 17.  Encouraged him if recurrent chest pressure to contact cardiology or if severe return to ER.  Symptoms seem more consistent with gastritis, reflux versus PUD.  Will evaluate liver function and pancreas with labs today as not done in the ER. Instructed patient to increase Prilosec back to 40 mg daily and add Pepcid AC twice daily. He has been using meloxicam regularly and has  had more oranges and a large amount of chili, chocolate lately. Recommended avoiding acid triggers.  Headache may be simply from mild dehydration as he is not taking in much liquids.  Description consistent with tension headache.  Encouraged water intake. Normal neurologic exam in office today. No follow-ups on file.   Trevor Nora, MD

## 2023-04-19 NOTE — Patient Instructions (Signed)
Increase prilosec to 40 mg daily.  Start pepcid AC  twice daily over the counter.  Stop meloxicam.  We will eval liver and pancreas test. Avoid acidic, foods, caffeine etc. Push water or hydration.

## 2023-04-25 ENCOUNTER — Ambulatory Visit (HOSPITAL_BASED_OUTPATIENT_CLINIC_OR_DEPARTMENT_OTHER): Payer: PPO | Admitting: Cardiology

## 2023-04-25 ENCOUNTER — Encounter (HOSPITAL_BASED_OUTPATIENT_CLINIC_OR_DEPARTMENT_OTHER): Payer: Self-pay | Admitting: Cardiology

## 2023-04-25 VITALS — BP 140/80 | HR 64 | Ht 71.0 in | Wt 209.0 lb

## 2023-04-25 DIAGNOSIS — E7800 Pure hypercholesterolemia, unspecified: Secondary | ICD-10-CM

## 2023-04-25 DIAGNOSIS — I872 Venous insufficiency (chronic) (peripheral): Secondary | ICD-10-CM | POA: Diagnosis not present

## 2023-04-25 DIAGNOSIS — I2510 Atherosclerotic heart disease of native coronary artery without angina pectoris: Secondary | ICD-10-CM | POA: Diagnosis not present

## 2023-04-25 DIAGNOSIS — R079 Chest pain, unspecified: Secondary | ICD-10-CM | POA: Diagnosis not present

## 2023-04-25 DIAGNOSIS — Z7189 Other specified counseling: Secondary | ICD-10-CM

## 2023-04-25 NOTE — Patient Instructions (Signed)
Medication Instructions:  Your physician recommends that you continue on your current medications as directed. Please refer to the Current Medication list given to you today.   *If you need a refill on your cardiac medications before your next appointment, please call your pharmacy*  Lab Work: NONE   Testing/Procedures: NONE   Follow-Up: At [facility], you and your health needs are our priority.  As part of our continuing mission to provide you with exceptional heart care, we have created designated Provider Care Teams.  These Care Teams include your primary Cardiologist (physician) and Advanced Practice Providers (APPs -  Physician Assistants and Nurse Practitioners) who all work together to provide you with the care you need, when you need it.  We recommend signing up for the patient portal called "MyChart".  Sign up information is provided on this After Visit Summary.  MyChart is used to connect with patients for Virtual Visits (Telemedicine).  Patients are able to view lab/test results, encounter notes, upcoming appointments, etc.  Non-urgent messages can be sent to your provider as well.   To learn more about what you can do with MyChart, go to [email or web address].    Your next appointment:   3 month(s)  The format for your next appointment:   In Person  Provider:   Bridgette Christopher, MD              

## 2023-04-25 NOTE — Progress Notes (Signed)
Cardiology Office Note:  .    Date:  04/25/2023  ID:  Trevor Ross, DOB Jan 16, 1946, MRN [id number] PCP: Joaquim Nam, MD   [facility] Providers Cardiologist:  Jodelle Red, MD     History of Present Illness: .    Trevor Ross is a 77 y.o. male with a hx of arthritis, vascular calcification who is seen for follow up today. I initially met him 01/08/20 as a new consult at the request of Joaquim Nam, MD for the evaluation and management of possible coronary artery disease and chest pain.   CV history: CT coronary 2021 with Ca score 2616 (94th percentile). Moderate stenosis (50-69% in prox-mid LAD and RCA, negative by FFR.   Today: Presented to the ER on 04/17/23 with chest pain, headache, and dizziness. Chest pain improved with nitroglycerin. ECG unremarkable, hsTn normal x2.   He tells me it started as dizziness/nausea, developed pressure in the center of his chest. Happened in the early afternoon, was working in the garage, improved with one nitroglycerin. Called 911, EMTs recommended evaluation in the ER.   Saw PCP since then, has cut out caffeinated coffee. He was also increased on his omeprazole, told to avoid spicy food and citrus. This has helped some of his symptoms, but dizziness and headache have persisted. Had some nausea but no chest pressure. Started meclizine with the dizziness, this greatly improved his dizziness and nausea. Has had inner ear issues before. Notes that he gets sinus/ear pressure this time of year.  We discussed possible etiologies at length today, see below.  Blood pressures have been generally well controlled, no lows but has had some high numbers when he feels poorly.  ROS:  Denies shortness of breath at rest or with normal exertion. No PND, orthopnea, LE edema or unexpected weight gain. No syncope or palpitations. ROS otherwise negative except as noted.   Studies Reviewed: Marland Kitchen         Physical Exam:    VS:  BP (!) 140/80 (BP  Location: Left Arm, Patient Position: Sitting, Cuff Size: Normal)   Pulse 64   Ht 5\' 11"  (1.803 m)   Wt 209 lb (94.8 kg)   SpO2 99%   BMI 29.15 kg/m    Wt Readings from Last 3 Encounters:  04/25/23 209 lb (94.8 kg)  04/19/23 205 lb 6.4 oz (93.2 kg)  02/01/23 202 lb (91.6 kg)    GEN: Well nourished, well developed in no acute distress HEENT: Normal, moist mucous membranes NECK: No JVD CARDIAC: regular rhythm, normal S1 and S2, no rubs or gallops. No murmur. VASCULAR: Radial and DP pulses 2+ bilaterally. No carotid bruits RESPIRATORY:  Clear to auscultation without rales, wheezing or rhonchi  ABDOMEN: Soft, non-tender, non-distended MUSCULOSKELETAL:  Ambulates independently SKIN: Warm and dry, no edema NEUROLOGIC:  Alert and oriented x 3. No focal neuro deficits noted. PSYCHIATRIC:  Normal affect   ASSESSMENT AND PLAN: .    CAD without angina Vascular calcification Family history of heart disease Recent chest pain with normal ECG/hsTN -CT shows elevated calcium score and moderate CAD, but FFR was negative for flow limiting obstruction -continue aspirin 81 mg daily and rosuvastatin 20 mg daily -has PRN SL NG, understands how to use and risk with PDE5i -now has rare atypical (MSK) pain and GERD. Most recent episode most consistent with GERD -counseled on red flag warning signs that need immediate medical attention  GERD -symptoms have improved with diet change, increasing omeprazole   Hypertension -systolic  above goal, but improved on home numbers -was on amlodipine 2.5 mg previously, stopped by Dr. Para March in 09/2022, patient has less lightheadedness -reviewed checking BP twice/week, contact me if consistently >140/90 and we will need to reconsider meds   Hypercholesterolemia -continue rosuvastatin 20 mg daily given CAD, vascular calcification -last LDL 53, at goal of <70   Cardiac risk counseling and prevention recommendations: -recommend heart healthy/Mediterranean diet,  with whole grains, fruits, vegetable, fish, lean meats, nuts, and olive oil. Limit salt. -recommend moderate walking, 3-5 times/week for 30-50 minutes each session. Aim for at least 150 minutes/week. Goal should be pace of 3 miles/hours, or walking 1.5 miles in 30 minutes -recommend avoidance of tobacco products. Avoid excess alcohol.   Dispo: 3 mos or sooner as needed.  Signed, Jodelle Red, MD

## 2023-04-27 DIAGNOSIS — M25561 Pain in right knee: Secondary | ICD-10-CM | POA: Diagnosis not present

## 2023-05-07 IMAGING — DX DG SHOULDER 2+V*R*
3 series · 3 of 3 positions shown · non-contrast
Comparison: None.

CLINICAL DATA: Right pectoral area discomfort with shoulder range
of motion.

EXAM:
RIGHT SHOULDER - 2+ VIEW

[shoulder axial]
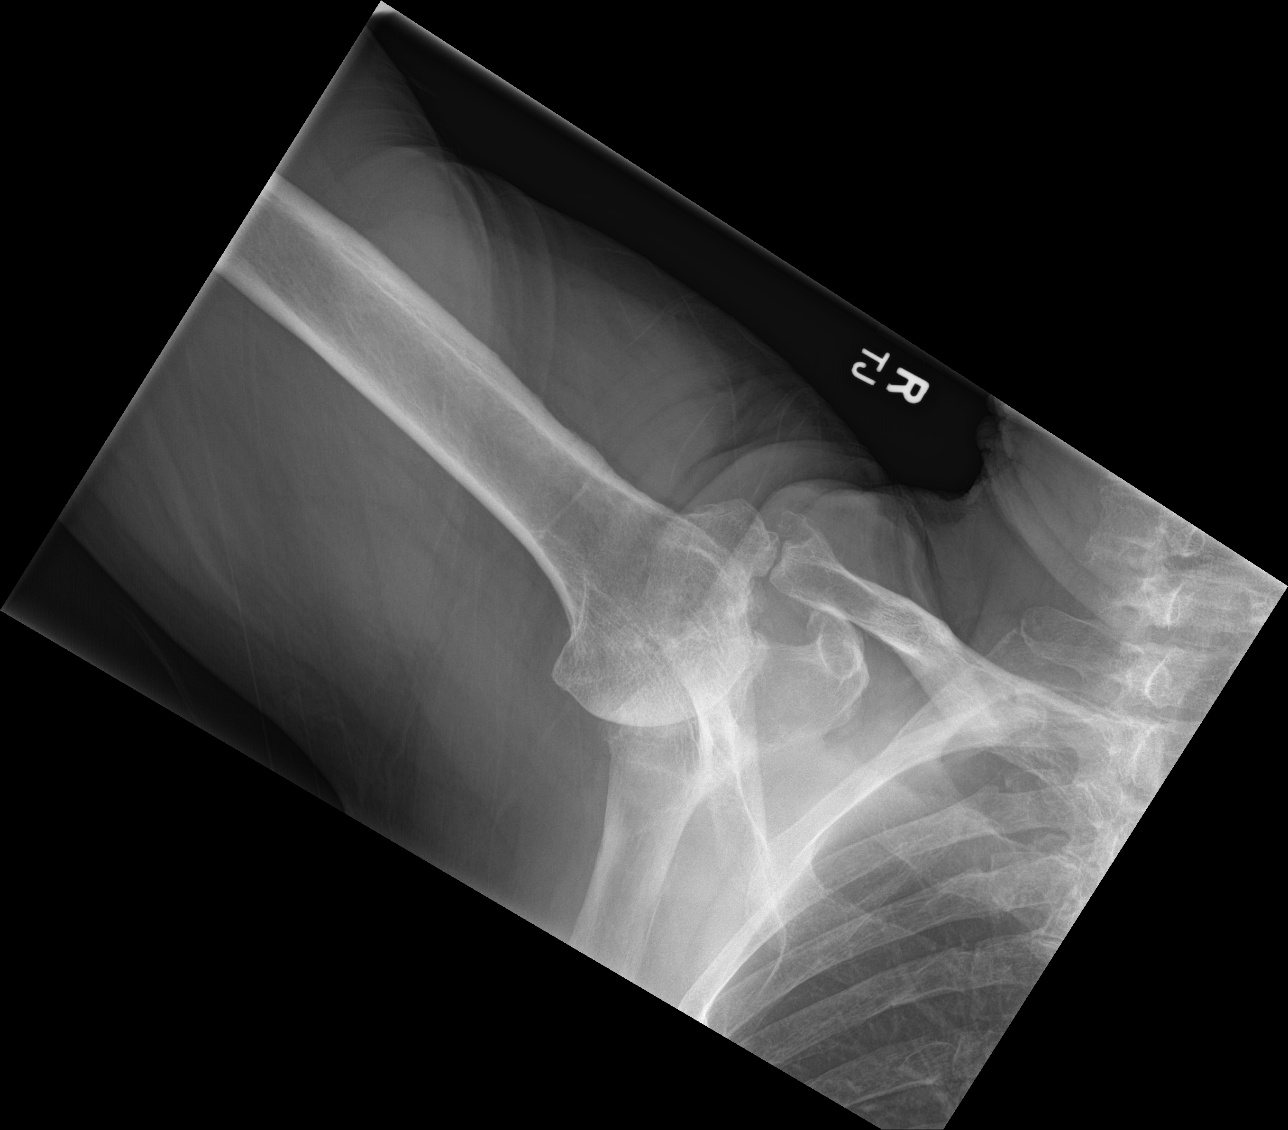

[shoulder obl]
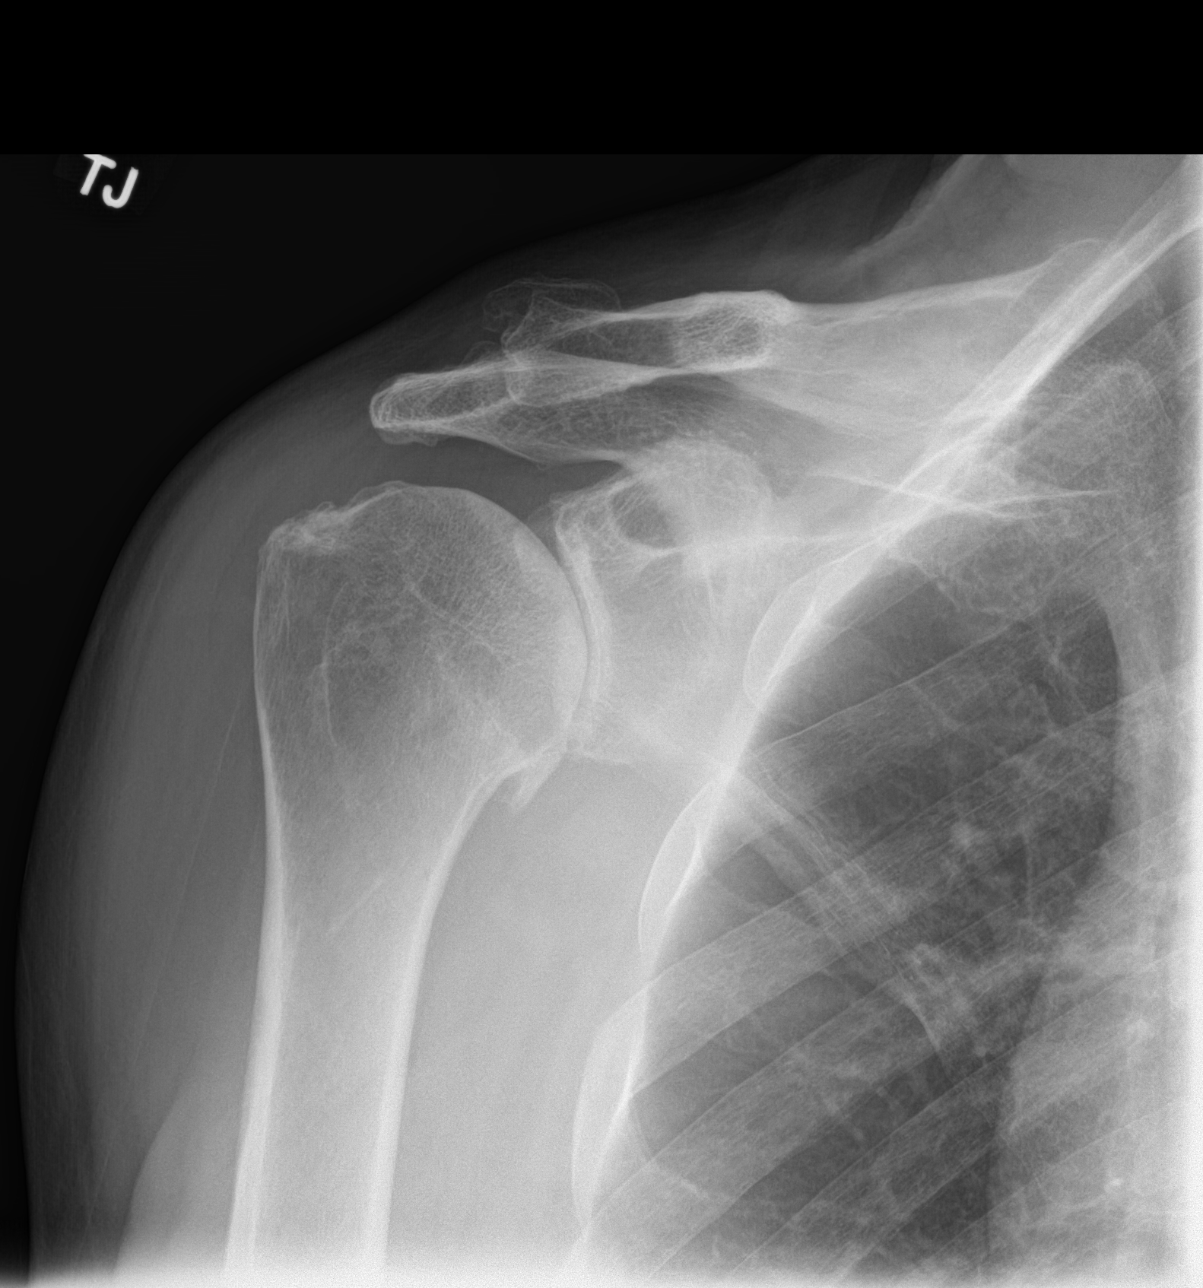

[shoulder y-view]
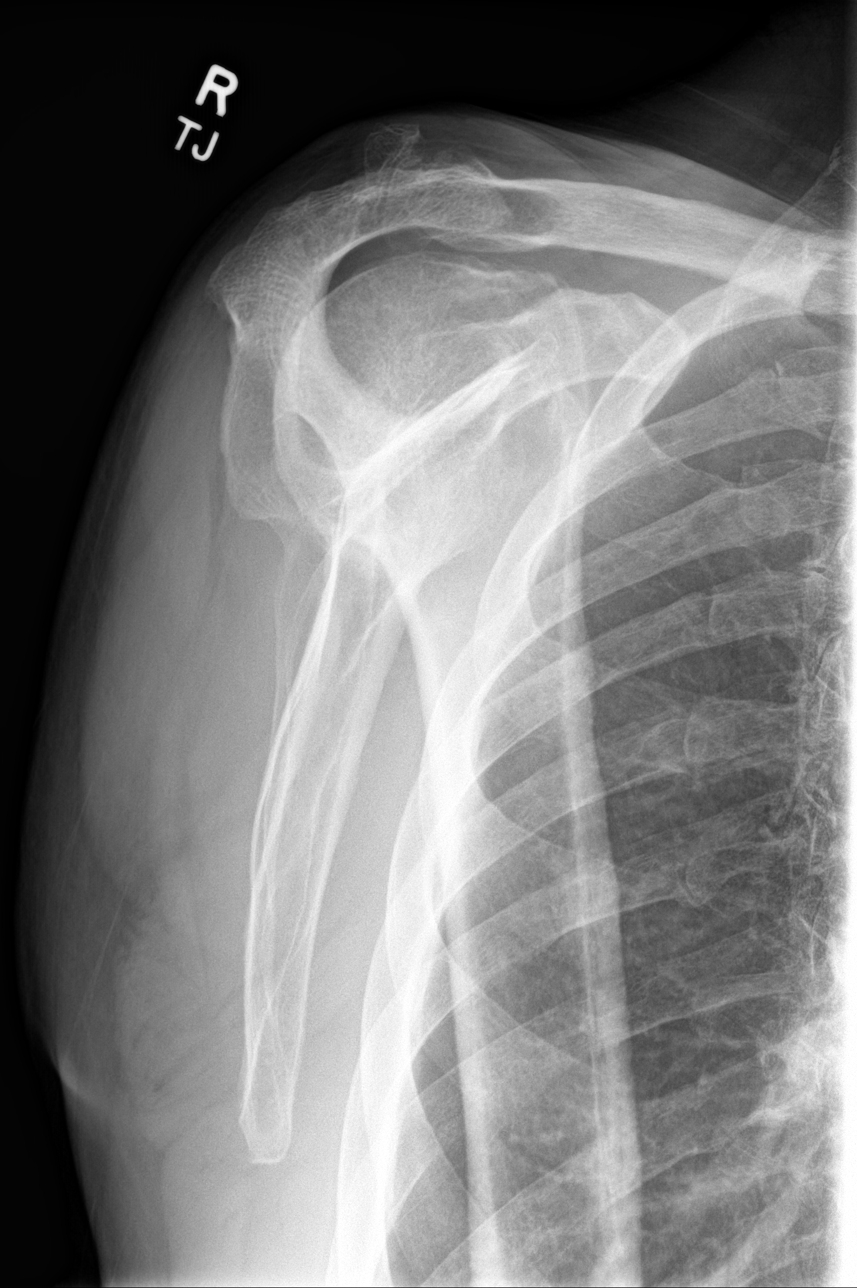

[3 of 3 positions shown; findings below may reference images not displayed]

FINDINGS: Moderate glenohumeral osteoarthritis with joint space narrowing,
inferior spurs and mild subchondral cystic change. Fragmented
osteophyte arising from the superior aspect of the acromioclavicular
joint. There is subcortical cystic change in the lateral humeral
head. No evidence of avascular necrosis, focal bone lesion or bone
destruction. Included right hemithorax is unremarkable. No soft
tissue calcification.
IMPRESSION: Moderate glenohumeral osteoarthritis. Subcortical cystic change in
the lateral humeral head may be secondary to rotator cuff
arthropathy.

## 2023-05-28 ENCOUNTER — Other Ambulatory Visit: Payer: Self-pay | Admitting: Family Medicine

## 2023-05-28 DIAGNOSIS — I10 Essential (primary) hypertension: Secondary | ICD-10-CM

## 2023-05-28 DIAGNOSIS — Z125 Encounter for screening for malignant neoplasm of prostate: Secondary | ICD-10-CM

## 2023-05-31 ENCOUNTER — Other Ambulatory Visit (INDEPENDENT_AMBULATORY_CARE_PROVIDER_SITE_OTHER): Payer: PPO

## 2023-05-31 DIAGNOSIS — Z125 Encounter for screening for malignant neoplasm of prostate: Secondary | ICD-10-CM

## 2023-05-31 DIAGNOSIS — I10 Essential (primary) hypertension: Secondary | ICD-10-CM

## 2023-05-31 LAB — COMPREHENSIVE METABOLIC PANEL
ALT: 20 U/L (ref 0–53)
AST: 21 U/L (ref 0–37)
Albumin: 4.3 g/dL (ref 3.5–5.2)
Alkaline Phosphatase: 62 U/L (ref 39–117)
BUN: 24 mg/dL — ABNORMAL HIGH (ref 6–23)
CO2: 30 meq/L (ref 19–32)
Calcium: 9.2 mg/dL (ref 8.4–10.5)
Chloride: 105 meq/L (ref 96–112)
Creatinine, Ser: 1.18 mg/dL (ref 0.40–1.50)
GFR: 60.05 mL/min (ref >60.00)
Glucose, Bld: 102 mg/dL — ABNORMAL HIGH (ref 70–99)
Potassium: 4.6 meq/L (ref 3.5–5.1)
Sodium: 140 meq/L (ref 135–145)
Total Bilirubin: 0.7 mg/dL (ref 0.2–1.2)
Total Protein: 6.5 g/dL (ref 6.0–8.3)

## 2023-05-31 LAB — LIPID PANEL
Cholesterol: 136 mg/dL (ref 0–200)
HDL: 66.2 mg/dL (ref >39.00)
LDL Cholesterol: 59 mg/dL (ref 0–99)
NonHDL: 69.77
Total CHOL/HDL Ratio: 2
Triglycerides: 52 mg/dL (ref 0.0–149.0)
VLDL: 10.4 mg/dL (ref 0.0–40.0)

## 2023-05-31 LAB — PSA, MEDICARE: PSA: 1.69 ng/mL (ref 0.10–4.00)

## 2023-05-31 LAB — TSH: TSH: 2.66 u[IU]/mL (ref 0.35–5.50)

## 2023-06-14 ENCOUNTER — Telehealth: Payer: Self-pay | Admitting: Family Medicine

## 2023-06-14 NOTE — Telephone Encounter (Signed)
 Called and LMOVM for patient at both numbers. I didn't get in touch with patient.    I am unexpectedly going to be out of clinic.  I told him in the message we would get him rescheduled.

## 2023-06-14 NOTE — Telephone Encounter (Signed)
 Pt called back. He knows I am going to be out of clinic.   Patient was very gracious about this.  I told him we would get him rescheduled.  I thanked him for taking the call.

## 2023-06-15 ENCOUNTER — Encounter: Payer: PPO | Admitting: Family Medicine

## 2023-06-15 NOTE — Telephone Encounter (Signed)
 Please see about rescheduling patient

## 2023-07-04 ENCOUNTER — Telehealth: Payer: Self-pay | Admitting: Family Medicine

## 2023-07-04 NOTE — Telephone Encounter (Signed)
 Pt brought by form from Pharmacy for Dr. Para March to review. Form is in [employer]. Call back # [phone]

## 2023-07-04 NOTE — Telephone Encounter (Signed)
 Documentation of the flu. Chart has been updated and paperwork sent to the scan center

## 2023-07-06 ENCOUNTER — Ambulatory Visit: Payer: PPO

## 2023-07-06 VITALS — Ht 71.0 in | Wt 209.0 lb

## 2023-07-06 DIAGNOSIS — Z0000 Encounter for general adult medical examination without abnormal findings: Secondary | ICD-10-CM | POA: Diagnosis not present

## 2023-07-06 NOTE — Progress Notes (Signed)
 Subjective:   Trevor Ross is a 78 y.o. who presents for a Medicare Wellness preventive visit.  Visit Complete: Virtual I connected with  Liana Gerold on 07/06/23 by a audio enabled telemedicine application and verified that I am speaking with the correct person using two identifiers.  Patient Location: Home  Provider Location: Office/Clinic  I discussed the limitations of evaluation and management by telemedicine. The patient expressed understanding and agreed to proceed.  Vital Signs: Because this visit was a virtual/telehealth visit, some criteria may be missing or patient reported. Any vitals not documented were not able to be obtained and vitals that have been documented are patient reported.  VideoDeclined- This patient declined Librarian, academic. Therefore the visit was completed with audio only.  AWV Questionnaire: No: Patient Medicare AWV questionnaire was not completed prior to this visit.  Cardiac Risk Factors include: advanced age (>38men, >63 women);dyslipidemia;hypertension;male gender     Objective:    Today's Vitals   07/06/23 1046  Weight: 209 lb (94.8 kg)  Height: 5\' 11"  (1.803 m)   Body mass index is 29.15 kg/m.     07/06/2023   10:53 AM 04/17/2023    4:50 PM 06/30/2022    1:55 PM 05/26/2021   11:38 AM 04/14/2020    7:29 PM 11/14/2019    9:33 AM 02/08/2018   10:50 AM  Advanced Directives  Does Patient Have a Medical Advance Directive? Yes No Yes Yes No No No;Yes  Type of Estate agent of [place];Living will  Healthcare Power of [place];Living will Healthcare Power of [place];Living will   Healthcare Power of [place];Living will  Does patient want to make changes to medical advance directive?   No - Patient declined Yes (MAU/Ambulatory/Procedural Areas - Information given)   No - Patient declined  Copy of Healthcare Power of Attorney in Chart? Yes - validated most recent copy scanned in chart (See row  information)  Yes - validated most recent copy scanned in chart (See row information)    No - copy requested  Would patient like information on creating a medical advance directive?      No - Patient declined No - Patient declined    Current Medications (verified) Outpatient Encounter Medications as of 07/06/2023  Medication Sig   aspirin EC 81 MG tablet Take 1 tablet (81 mg total) by mouth daily. Swallow whole.   fluticasone (FLONASE) 50 MCG/ACT nasal spray PLACE 1 SPRAY INTO BOTH NOSTRILS 2 TIMESDAILY   meclizine (ANTIVERT) 25 MG tablet Take 25 mg by mouth 3 (three) times daily as needed for dizziness.   melatonin 5 MG TABS Take 1 tablet (5 mg total) by mouth at bedtime.   meloxicam (MOBIC) 15 MG tablet TAKE ONE TABLET BY MOUTH ONCE A DAY WITH MEALS.   multivitamin-lutein (OCUVITE-LUTEIN) CAPS capsule Take 1 capsule by mouth daily.   nitroGLYCERIN (NITROSTAT) 0.4 MG SL tablet Place 1 tablet (0.4 mg total) under the tongue every 5 (five) minutes as needed for chest pain (max 3 doses).   omeprazole (PRILOSEC) 20 MG capsule TAKE TWO CAPSULES BY MOUTH DAILY   rosuvastatin (CRESTOR) 20 MG tablet TAKE ONE TABLET (20 MG TOTAL) BY MOUTH DAILY. PLEASE KEEP YOUR UPCOMING APPOINTMENT FOR REFILLS.   sildenafil (VIAGRA) 50 MG tablet Take 100 mg by mouth daily as needed for erectile dysfunction.   tamsulosin (FLOMAX) 0.4 MG CAPS capsule Take 0.4 mg by mouth daily after breakfast.   No facility-administered encounter medications on file as of  07/06/2023.    Allergies (verified) Patient has no known allergies.   History: Past Medical History:  Diagnosis Date   Allergy    Arthritis    hands   Diverticulosis of colon 09/15/2006   Dysphagia    Hiatal hernia    Hyperlipidemia    Hypertension    Nasal polyps    Nephrolithiasis    PSA per Dr. Aldean Ast   OSA (obstructive sleep apnea)    Past Surgical History:  Procedure Laterality Date   APPENDECTOMY  1988   CARPAL TUNNEL RELEASE Left     CATARACT EXTRACTION Bilateral    Left knee repair  1970's   LITHOTRIPSY     twice per pt   right knee surgery     arthroscopic   Sinus surgery for polyps     Family History  Problem Relation Age of Onset   Heart disease Mother    Prostate cancer Father    Colon cancer Maternal Grandmother    Social History   Socioeconomic History   Marital status: Married    Spouse name: Not on file   Number of children: 0   Years of education: Not on file   Highest education level: Not on file  Occupational History   Occupation: Surveyor, minerals  Tobacco Use   Smoking status: Never   Smokeless tobacco: Never  Vaping Use   Vaping status: Never Used  Substance and Sexual Activity   Alcohol use: Yes    Alcohol/week: 0.0 standard drinks of alcohol    Comment: rare   Drug use: No   Sexual activity: Not Currently    Partners: Female  Other Topics Concern   Not on file  Social History Narrative   1 step-daughter, 1 grandson   Married 1980   Social Drivers of Corporate investment banker Strain: Low Risk  (07/06/2023)   Overall Financial Resource Strain (CARDIA)    Difficulty of Paying Living Expenses: Not hard at all  Food Insecurity: No Food Insecurity (07/06/2023)   Hunger Vital Sign    Worried About Running Out of Food in the Last Year: Never true    Ran Out of Food in the Last Year: Never true  Transportation Needs: No Transportation Needs (07/06/2023)   PRAPARE - Administrator, Civil Service (Medical): No    Lack of Transportation (Non-Medical): No  Physical Activity: Insufficiently Active (07/06/2023)   Exercise Vital Sign    Days of Exercise per Week: 3 days    Minutes of Exercise per Session: 40 min  Stress: No Stress Concern Present (07/06/2023)   [employer] of Occupational Health - Occupational Stress Questionnaire    Feeling of Stress : Not at all  Social Connections: Moderately Isolated (07/06/2023)   Social Connection and Isolation Panel [NHANES]     Frequency of Communication with Friends and Family: Twice a week    Frequency of Social Gatherings with Friends and Family: More than three times a week    Attends Religious Services: Never    Database administrator or Organizations: No    Attends Engineer, structural: Never    Marital Status: Married    Tobacco Counseling Counseling given: Not Answered    Clinical Intake:  Pre-visit preparation completed: Yes  Pain : No/denies pain     BMI - recorded: 29.15 Nutritional Status: BMI 25 -29 Overweight Nutritional Risks: None Diabetes: No  How often do you need to have someone help you when you read instructions,  pamphlets, or other written materials from your doctor or pharmacy?: 1 - Never  Interpreter Needed?: No  Comments: lives with wife Information entered by :: B.Allie Gerhold,LPN   Activities of Daily Living     07/06/2023   10:57 AM  In your present state of health, do you have any difficulty performing the following activities:  Hearing? 0  Vision? 0  Difficulty concentrating or making decisions? 0  Walking or climbing stairs? 0  Dressing or bathing? 0  Doing errands, shopping? 0  Preparing Food and eating ? N  Using the Toilet? N  In the past six months, have you accidently leaked urine? N  Do you have problems with loss of bowel control? N  Managing your Medications? N  Managing your Finances? N  Housekeeping or managing your Housekeeping? N    Patient Care Team: Joaquim Nam, MD as PCP - General (Family Medicine) Jodelle Red, MD as PCP - Cardiology (Cardiology)  Indicate any recent Medical Services you may have received from other than [facility] providers in the past year (date may be approximate).     Assessment:   This is a routine wellness examination for [place].  Hearing/Vision screen Hearing Screening - Comments:: Pt says his hearing is good Vision Screening - Comments:: Pt says vision is good after transplants;readers  only Dr Spero Curb   Goals Addressed               This Visit's Progress     COMPLETED: Increase physical activity   On track     Starting 02/08/2018, I will continue to exercise for 30-45 minutes 3 days per week.       COMPLETED: Patient Stated   On track     Would like to eat healthier      Patient Stated (pt-stated)        07/06/23- want to to lose weight 15 lb by riding stationery bike, walking track, slack on junk food.       Depression Screen     07/06/2023   10:51 AM 08/29/2022    3:48 PM 06/30/2022    1:54 PM 05/26/2021   11:40 AM 04/21/2020   11:29 AM 02/19/2019    8:46 AM 02/08/2018   10:50 AM  PHQ 2/9 Scores  PHQ - 2 Score 0 0 0 0 0 0 0  PHQ- 9 Score  4     0    Fall Risk     07/06/2023   10:48 AM 08/29/2022    3:48 PM 06/30/2022    1:48 PM 05/26/2021   11:39 AM 04/21/2020   11:29 AM  Fall Risk   Falls in the past year? 0 0 0 0 0  Number falls in past yr: 0 0 0 0 0  Injury with Fall? 0 0 0 0 0  Risk for fall due to : No Fall Risks No Fall Risks No Fall Risks No Fall Risks   Follow up Education provided;Falls prevention discussed Falls evaluation completed Falls prevention discussed;Falls evaluation completed Falls prevention discussed Falls evaluation completed    MEDICARE RISK AT HOME:  Medicare Risk at Home Any stairs in or around the home?: Yes If so, are there any without handrails?: Yes Home free of loose throw rugs in walkways, pet beds, electrical cords, etc?: Yes Adequate lighting in your home to reduce risk of falls?: Yes Life alert?: No Use of a cane, walker or w/c?: No Grab bars in the bathroom?: No Shower chair or bench in  shower?: No Elevated toilet seat or a handicapped toilet?: No  TIMED UP AND GO:  Was the test performed?  No  Cognitive Function: 6CIT completed    02/08/2018   10:50 AM 02/07/2017    9:51 AM  MMSE - Mini Mental State Exam  Orientation to time 5 5  Orientation to Place 5 5  Registration 3 3  Attention/ Calculation  0 0  Recall 3 3  Language- name 2 objects 0 0  Language- repeat 1 1  Language- follow 3 step command 3 3  Language- read & follow direction 0 0  Write a sentence 0 0  Copy design 0 0  Total score 20 20        07/06/2023   10:58 AM 06/30/2022    1:58 PM  6CIT Screen  What Year? 0 points 0 points  What month? 0 points   What time? 0 points 0 points  Count back from 20 0 points 0 points  Months in reverse 0 points 0 points  Repeat phrase 0 points 4 points  Total Score 0 points     Immunizations Immunization History  Administered Date(s) Administered   Fluad Quad(high Dose 65+) 03/06/2020, 02/04/2022, 03/24/2023   Influenza, High Dose Seasonal PF 02/04/2022   Influenza, Mdck, Trivalent,PF 6+ MOS(egg free) 02/26/2021   Influenza,inj,Quad PF,6+ Mos 02/14/2017, 02/15/2018, 02/19/2019   Influenza-Unspecified 03/05/2020, 02/26/2021, 02/04/2022   PFIZER(Purple Top)SARS-COV-2 Vaccination 07/05/2019, 07/30/2019   Pneumococcal Conjugate-13 02/14/2017   Pneumococcal Polysaccharide-23 02/15/2018   Td 03/12/2003   Tdap 02/28/2017   Zoster Recombinant(Shingrix) 07/29/2022, 11/28/2022   Zoster, Live 11/01/2012   Zoster, Unspecified 12/01/2022    Screening Tests Health Maintenance  Topic Date Due   Colonoscopy  02/11/2024   Medicare Annual Wellness (AWV)  07/05/2024   DTaP/Tdap/Td (3 - Td or Tdap) 03/01/2027   Pneumonia Vaccine 51+ Years old  Completed   INFLUENZA VACCINE  Completed   Hepatitis C Screening  Completed   Zoster Vaccines- Shingrix  Completed   HPV VACCINES  Aged Out   COVID-19 Vaccine  Discontinued    Health Maintenance  There are no preventive care reminders to display for this patient.  Health Maintenance Items Addressed: none needed   Additional Screening:  Vision Screening: Recommended annual ophthalmology exams for early detection of glaucoma and other disorders of the eye.  Dental Screening: Recommended annual dental exams for proper oral  hygiene  Community Resource Referral / Chronic Care Management: CRR required this visit?  No   CCM required this visit?  No-Pt says he will reschedule PE appt    Plan:     I have personally reviewed and noted the following in the patient's chart:   Medical and social history Use of alcohol, tobacco or illicit drugs  Current medications and supplements including opioid prescriptions. Patient is not currently taking opioid prescriptions. Functional ability and status Nutritional status Physical activity Advanced directives List of other physicians Hospitalizations, surgeries, and ER visits in previous 12 months Vitals Screenings to include cognitive, depression, and falls Referrals and appointments  In addition, I have reviewed and discussed with patient certain preventive protocols, quality metrics, and best practice recommendations. A written personalized care plan for preventive services as well as general preventive health recommendations were provided to patient.     Sue Lush, LPN   7/84/6962   After Visit Summary: (MyChart) Due to this being a telephonic visit, the after visit summary with patients personalized plan was offered to patient via MyChart  Notes: Nothing significant to report at this time.

## 2023-07-06 NOTE — Patient Instructions (Signed)
 Trevor Ross , Thank you for taking time to come for your Medicare Wellness Visit. I appreciate your ongoing commitment to your health goals. Please review the following plan we discussed and let me know if I can assist you in the future.   Referrals/Orders/Follow-Ups/Clinician Recommendations: none  This is a list of the screening recommended for you and due dates:  Health Maintenance  Topic Date Due   Colon Cancer Screening  02/11/2024   Medicare Annual Wellness Visit  07/05/2024   DTaP/Tdap/Td vaccine (3 - Td or Tdap) 03/01/2027   Pneumonia Vaccine  Completed   Flu Shot  Completed   Hepatitis C Screening  Completed   Zoster (Shingles) Vaccine  Completed   HPV Vaccine  Aged Out   COVID-19 Vaccine  Discontinued    Advanced directives: (In Chart) A copy of your advanced directives are scanned into your chart should your provider ever need it.  Next Medicare Annual Wellness Visit scheduled for next year: Yes 07/09/24 @ 11:30am televisit

## 2023-07-12 ENCOUNTER — Ambulatory Visit (HOSPITAL_BASED_OUTPATIENT_CLINIC_OR_DEPARTMENT_OTHER): Payer: PPO | Admitting: Cardiology

## 2023-07-12 ENCOUNTER — Encounter (HOSPITAL_BASED_OUTPATIENT_CLINIC_OR_DEPARTMENT_OTHER): Payer: Self-pay | Admitting: Cardiology

## 2023-07-12 VITALS — BP 132/76 | HR 55 | Ht 71.0 in | Wt 212.6 lb

## 2023-07-12 DIAGNOSIS — Z8249 Family history of ischemic heart disease and other diseases of the circulatory system: Secondary | ICD-10-CM

## 2023-07-12 DIAGNOSIS — I2510 Atherosclerotic heart disease of native coronary artery without angina pectoris: Secondary | ICD-10-CM | POA: Diagnosis not present

## 2023-07-12 DIAGNOSIS — I10 Essential (primary) hypertension: Secondary | ICD-10-CM

## 2023-07-12 DIAGNOSIS — E7800 Pure hypercholesterolemia, unspecified: Secondary | ICD-10-CM

## 2023-07-12 NOTE — Progress Notes (Signed)
  Cardiology Office Note:  .    Date:  07/12/2023  ID:  Trevor Ross, DOB 1945/10/24, MRN [id number] PCP: Joaquim Nam, MD  [facility] Providers Cardiologist:  Jodelle Red, MD     History of Present Illness: .    Trevor Ross is a 78 y.o. male with a hx of arthritis, vascular calcification who is seen for follow up today. I initially met him 01/08/20 as a new consult at the request of Joaquim Nam, MD for the evaluation and management of possible coronary artery disease and chest pain.   CV history: CT coronary 2021 with Ca score 2616 (94th percentile). Moderate stenosis (50-69% in prox-mid LAD and RCA, negative by FFR.   Today: Overall doing well. Mild chest pressure after lifting weights earlier this week, which he feels is MSK, but hasn't had any more issues like he did in December. Tried meclizine and felt better after our last visit.   Feels that super beets have improved his energy level. Has not needed nitroglycerin.   ROS:  Denies shortness of breath at rest or with normal exertion. No PND, orthopnea, LE edema or unexpected weight gain. No syncope or palpitations. ROS otherwise negative except as noted.   Studies Reviewed: Marland Kitchen         Physical Exam:    VS:  BP 132/76   Pulse (!) 55   Ht 5\' 11"  (1.803 m)   Wt 212 lb 9.6 oz (96.4 kg)   SpO2 97%   BMI 29.65 kg/m    Wt Readings from Last 3 Encounters:  07/12/23 212 lb 9.6 oz (96.4 kg)  07/06/23 209 lb (94.8 kg)  04/25/23 209 lb (94.8 kg)    GEN: Well nourished, well developed in no acute distress HEENT: Normal, moist mucous membranes NECK: No JVD CARDIAC: regular rhythm, normal S1 and S2, no rubs or gallops. 1/6 RUSB systolic murmur. VASCULAR: Radial and DP pulses 2+ bilaterally. No carotid bruits RESPIRATORY:  Clear to auscultation without rales, wheezing or rhonchi  ABDOMEN: Soft, non-tender, non-distended MUSCULOSKELETAL:  Ambulates independently SKIN: Warm and dry, no edema NEUROLOGIC:   Alert and oriented x 3. No focal neuro deficits noted. PSYCHIATRIC:  Normal affect    ASSESSMENT AND PLAN: .    CAD without angina Vascular calcification Family history of heart disease -CT shows elevated calcium score and moderate CAD, but FFR was negative for flow limiting obstruction -continue aspirin 81 mg daily and rosuvastatin 20 mg daily -has PRN SL NG, understands how to use and risk with PDE5i -now has rare atypical (MSK) pain and GERD. -counseled on red flag warning signs that need immediate medical attention  GERD -symptoms have improved   Hypertension -systolic just at goal -was on amlodipine 2.5 mg previously, stopped by Dr. Para March in 09/2022, patient has less lightheadedness   Hypercholesterolemia -continue rosuvastatin 20 mg daily given CAD, vascular calcification -last LDL 59 05/2023, at goal of <78   Cardiac risk counseling and prevention recommendations: -recommend heart healthy/Mediterranean diet, with whole grains, fruits, vegetable, fish, lean meats, nuts, and olive oil. Limit salt. -recommend moderate walking, 3-5 times/week for 30-50 minutes each session. Aim for at least 150 minutes/week. Goal should be pace of 3 miles/hours, or walking 1.5 miles in 30 minutes -recommend avoidance of tobacco products. Avoid excess alcohol.   Dispo: 6 mos or sooner as needed.  Signed, Jodelle Red, MD

## 2023-07-12 NOTE — Patient Instructions (Signed)
 Medication Instructions:  Your physician recommends that you continue on your current medications as directed. Please refer to the Current Medication list given to you today.  *If you need a refill on your cardiac medications before your next appointment, please call your pharmacy*  Follow-Up: At [facility], you and your health needs are our priority.  As part of our continuing mission to provide you with exceptional heart care, we have created designated Provider Care Teams.  These Care Teams include your primary Cardiologist (physician) and Advanced Practice Providers (APPs -  Physician Assistants and Nurse Practitioners) who all work together to provide you with the care you need, when you need it.  We recommend signing up for the patient portal called "MyChart".  Sign up information is provided on this After Visit Summary.  MyChart is used to connect with patients for Virtual Visits (Telemedicine).  Patients are able to view lab/test results, encounter notes, upcoming appointments, etc.  Non-urgent messages can be sent to your provider as well.   To learn more about what you can do with MyChart, go to [email or web address].    Your next appointment:   6 month(s)  Provider:   Jodelle Red, MD

## 2023-07-24 ENCOUNTER — Other Ambulatory Visit: Payer: Self-pay | Admitting: Family Medicine

## 2023-07-31 DIAGNOSIS — M1711 Unilateral primary osteoarthritis, right knee: Secondary | ICD-10-CM | POA: Diagnosis not present

## 2023-07-31 DIAGNOSIS — M25561 Pain in right knee: Secondary | ICD-10-CM | POA: Diagnosis not present

## 2023-08-08 ENCOUNTER — Telehealth: Payer: Self-pay

## 2023-08-08 DIAGNOSIS — M25559 Pain in unspecified hip: Secondary | ICD-10-CM

## 2023-08-08 NOTE — Telephone Encounter (Signed)
 Copied from CRM [id number]. Topic: Referral - Question >> Aug 08, 2023 11:59 AM Alcus Dad wrote: Reason for CRM: Patient is needing a referral to see Orthopedic Dr. For his hip.

## 2023-08-09 NOTE — Telephone Encounter (Signed)
 Patient notified

## 2023-08-09 NOTE — Addendum Note (Signed)
 Addended by: Joaquim Nam on: 08/09/2023 02:08 PM   Modules accepted: Orders

## 2023-08-09 NOTE — Telephone Encounter (Signed)
 Ordered, please let him know.  I put it in for [facility] since he had been seen there previously.  Thanks.

## 2023-08-17 DIAGNOSIS — M5441 Lumbago with sciatica, right side: Secondary | ICD-10-CM | POA: Diagnosis not present

## 2023-08-17 DIAGNOSIS — G8929 Other chronic pain: Secondary | ICD-10-CM | POA: Diagnosis not present

## 2023-08-17 DIAGNOSIS — M5416 Radiculopathy, lumbar region: Secondary | ICD-10-CM | POA: Diagnosis not present

## 2023-08-17 DIAGNOSIS — M79604 Pain in right leg: Secondary | ICD-10-CM | POA: Diagnosis not present

## 2023-08-30 DIAGNOSIS — M25551 Pain in right hip: Secondary | ICD-10-CM | POA: Diagnosis not present

## 2023-09-11 DIAGNOSIS — M4726 Other spondylosis with radiculopathy, lumbar region: Secondary | ICD-10-CM | POA: Diagnosis not present

## 2023-09-25 DIAGNOSIS — M5442 Lumbago with sciatica, left side: Secondary | ICD-10-CM | POA: Diagnosis not present

## 2023-09-25 DIAGNOSIS — G8929 Other chronic pain: Secondary | ICD-10-CM | POA: Diagnosis not present

## 2023-09-25 DIAGNOSIS — M1611 Unilateral primary osteoarthritis, right hip: Secondary | ICD-10-CM | POA: Diagnosis not present

## 2023-09-25 DIAGNOSIS — M5441 Lumbago with sciatica, right side: Secondary | ICD-10-CM | POA: Diagnosis not present

## 2023-09-25 DIAGNOSIS — M5416 Radiculopathy, lumbar region: Secondary | ICD-10-CM | POA: Diagnosis not present

## 2023-10-12 DIAGNOSIS — M4726 Other spondylosis with radiculopathy, lumbar region: Secondary | ICD-10-CM | POA: Diagnosis not present

## 2023-10-17 DIAGNOSIS — M4726 Other spondylosis with radiculopathy, lumbar region: Secondary | ICD-10-CM | POA: Diagnosis not present

## 2023-10-19 DIAGNOSIS — M4726 Other spondylosis with radiculopathy, lumbar region: Secondary | ICD-10-CM | POA: Diagnosis not present

## 2023-10-24 DIAGNOSIS — M4726 Other spondylosis with radiculopathy, lumbar region: Secondary | ICD-10-CM | POA: Diagnosis not present

## 2023-10-26 DIAGNOSIS — M4726 Other spondylosis with radiculopathy, lumbar region: Secondary | ICD-10-CM | POA: Diagnosis not present

## 2023-10-31 DIAGNOSIS — M4726 Other spondylosis with radiculopathy, lumbar region: Secondary | ICD-10-CM | POA: Diagnosis not present

## 2023-11-01 DIAGNOSIS — H35372 Puckering of macula, left eye: Secondary | ICD-10-CM | POA: Diagnosis not present

## 2023-11-01 DIAGNOSIS — Z961 Presence of intraocular lens: Secondary | ICD-10-CM | POA: Diagnosis not present

## 2023-11-01 DIAGNOSIS — H26493 Other secondary cataract, bilateral: Secondary | ICD-10-CM | POA: Diagnosis not present

## 2023-11-02 DIAGNOSIS — M4726 Other spondylosis with radiculopathy, lumbar region: Secondary | ICD-10-CM | POA: Diagnosis not present

## 2023-11-06 DIAGNOSIS — M5442 Lumbago with sciatica, left side: Secondary | ICD-10-CM | POA: Diagnosis not present

## 2023-11-06 DIAGNOSIS — M5416 Radiculopathy, lumbar region: Secondary | ICD-10-CM | POA: Diagnosis not present

## 2023-11-06 DIAGNOSIS — M5441 Lumbago with sciatica, right side: Secondary | ICD-10-CM | POA: Diagnosis not present

## 2023-11-06 DIAGNOSIS — G8929 Other chronic pain: Secondary | ICD-10-CM | POA: Diagnosis not present

## 2023-11-07 DIAGNOSIS — M4726 Other spondylosis with radiculopathy, lumbar region: Secondary | ICD-10-CM | POA: Diagnosis not present

## 2023-11-20 ENCOUNTER — Other Ambulatory Visit (INDEPENDENT_AMBULATORY_CARE_PROVIDER_SITE_OTHER)

## 2023-11-20 ENCOUNTER — Encounter: Payer: Self-pay | Admitting: Sports Medicine

## 2023-11-20 ENCOUNTER — Ambulatory Visit (INDEPENDENT_AMBULATORY_CARE_PROVIDER_SITE_OTHER): Admitting: Sports Medicine

## 2023-11-20 ENCOUNTER — Other Ambulatory Visit: Payer: Self-pay

## 2023-11-20 DIAGNOSIS — M7918 Myalgia, other site: Secondary | ICD-10-CM | POA: Diagnosis not present

## 2023-11-20 DIAGNOSIS — M533 Sacrococcygeal disorders, not elsewhere classified: Secondary | ICD-10-CM | POA: Diagnosis not present

## 2023-11-20 DIAGNOSIS — M5136 Other intervertebral disc degeneration, lumbar region with discogenic back pain only: Secondary | ICD-10-CM

## 2023-11-20 DIAGNOSIS — G8929 Other chronic pain: Secondary | ICD-10-CM

## 2023-11-20 NOTE — Progress Notes (Signed)
 OLIS Trevor Ross - 78 y.o. male MRN [id number]  Date of birth: 27-Nov-1945  Office Visit Note: Visit Date: 11/20/2023 PCP: Cleatus Arlyss RAMAN, MD Referred by: Cleatus Arlyss RAMAN, MD  Subjective: Chief Complaint  Patient presents with   Lower Back - Pain   HPI: Trevor Ross is a pleasant 78 y.o. male who presents today for new evaluation of chronic right hip/back pain. He presents with his wife today.  He has seen a colleague of mine, [employer], for a number of different orthopedic issues.  He has been following up with him for the low back in the SI joint.  Referred here for further evaluation and consideration of SI joint injection.  [name] has had low back pain for several years.  In the past he has symptoms that went down the leg but these have resolved.  He did have a previous fluoroscopy based hip injection back in April which helped for a few weeks the hip pain but he is having more pain over the right and middle of the back.  He uses Advil as needed which does help to some extent.  Previous MRI report shows a number of lumbar disc bulges with moderate to severe foraminal stenosis.  Pertinent ROS were reviewed with the patient and found to be negative unless otherwise specified above in HPI.   Assessment & Plan: Visit Diagnoses:  1. Chronic right SI joint pain   2. Degeneration of intervertebral disc of lumbar region with discogenic back pain   3. Right buttock pain    Plan: Impression is acute on chronic low back pain which may be multifactorial in nature, although he has pain specifically over the right SI joint with associated provocative maneuvers.  We discussed for both diagnostic and therapeutic purposes, proceeding with an ultrasound-guided right SI joint injection.  This was performed today, patient tolerated well.  Advised on postinjection protocol.  May use ice/heat, ibuprofen or Tylenol  as needed, after 48 hours he may return to activity without restriction.  I did discuss  with Coren and his wife to pay attention over the next 2-3 weeks to see what degree of relief (0-100%) he receives for this injection and the location of his pain relief.  If he gets excellent relief, he will continue as is.  If he only gets partial relief and/or still has pain within the back, next step could be consideration of a interlaminar/foraminal lumbar spine injection.  I will have him follow-up with Zac Currence over the next 3-4 weeks to re-evaluate and review with him.  Follow-up: Return for F/u with Zac Currence in the next 3-4 weeks SI joint.   Meds & Orders: No orders of the defined types were placed in this encounter.   Orders Placed This Encounter  Procedures   XR Pelvis 1-2 Views   US  Guided Needle Placement - No Linked Charges     Procedures: U/S-guided SI-joint injection, Right   After discussion of risk/benefits/indications, informed verbal consent was obtained. A timeout was then performed. The patient was positioned in a prone position on exam room table with a pillow placed under the pelvis for mild hip flexion. The SI joint area was cleaned and prepped with betadine and alcohol swabs. Sterile ultrasound gel was applied and the ultrasound transducer was placed in an anatomic axial plane over the PSIS, then moved distally over the SI-joint. Using ultrasound guidance, a 22-gauge, 3.5 needle was inserted from a medial to lateral approach utilizing an in-plane approach and  directed into the SI-joint. The SI-joint was then injected with a mixture of 4:2 lidocaine :depomedrol with visualization of the injectate flow into the SI-joint under ultrasound visualization. The patient tolerated the procedure well without immediate complications.       Clinical History: No specialty comments available.  He reports that he has never smoked. He has never used smokeless tobacco. No results for input(s): HGBA1C, LABURIC in the last 8760 hours.  Objective:    Physical Exam  Gen:  Well-appearing, in no acute distress; non-toxic CV: Well-perfused. Warm.  Resp: Breathing unlabored on room air; no wheezing. Psych: Fluid speech in conversation; appropriate affect; normal thought process  Ortho Exam - Hip/SI/Back: + TTP over the right SI joint just inferior to the PSIS.  There is some ginger range of motion with flexion/extension of the lumbar spine as well.  Positive Faber on the right, positive SI joint compression.  Negative FADIR.  Imaging: XR Pelvis 1-2 Views Result Date: 11/20/2023 1 view AP pelvis was ordered and reviewed by myself today.  X-rays demonstrate some mild SI joint sclerosis right greater than left.  Mild right hip osteoarthritis but no advanced arthritic change.  Incompletely visualized lumbar DDD.  No acute fracture noted.  09/11/23: MRI LUMBAR SPINE WITHOUT CONTRAST, 09/11/2023 4:41 PM   INDICATION: Low back pain, spondyloarthropathy suspected, xray done, Lumbar radiculopathy, symptoms persist with > 6 wks treatment, Radiculopathy, lumbar region \ \ M54.16 Radiculopathy, lumbar region \ M54.41 Lumbago with sciatica, right side \ G89.29 Other chronic pain   COMPARISON: 08/17/2023 x-ray.   TECHNIQUE: Multiplanar, multisequence surface-coil magnetic resonance imaging of the lumbar spine was performed without contrast.    LEVELS IMAGED: Lower thoracic region to upper sacrum.    FINDINGS:    Alignment: Grade 1 anterolisthesis of L5 on S1. Mild lumbar levocurvature centered at L4-L5.   Vertebrae: Multilevel vascular malformations, largest at the L3 vertebral body. Vertebral body heights are maintained. No marrow signal abnormalities to suggest neoplasm.   Conus medullaris: In normal position. Normal signal and contour.   Degenerative changes: Multilevel disc desiccation.   T12-L1: No substantial canal or foraminal stenosis.  L1-L2: Eccentric left disc bulge, facet effusion, facet arthropathy and ligamentum flavum hypertrophy contribute to mild left  foraminal stenosis.  L2-L3: Eccentric left disc bulge, facets arthropathy and ligamentum flavum hypertrophy. No substantial canal or foraminal stenosis.  L3-L4: Diffuse disc bulge, facet arthropathy and ligamentum flavum hypertrophy contribute to moderate right foraminal stenosis.  L4-L5: Diffuse disc bulge, facets and ligamentum flavum hypertrophy contribute to severe right moderate left foraminal stenosis.  L5-S1: Uncovering disc with diffuse bulge, facets arthropathy and ligamentum flavum hypertrophy contribute to severe bilateral foraminal stenosis.   Upper sacrum: No focal lesion identified.  Additional: Bilateral renal cysts.   Past Medical/Family/Surgical/Social History: Medications & Allergies reviewed per EMR, new medications updated. Patient Active Problem List   Diagnosis Date Noted   Nausea 08/31/2022   HTN (hypertension) 03/27/2022   HLD (hyperlipidemia) 03/27/2022   Chest wall pain 10/21/2020   Paresthesia 10/21/2020   Atypical chest pain 04/22/2020   FH: CAD (coronary artery disease) 11/20/2019   Shoulder pain 11/20/2019   Right foot pain 10/07/2019   Medicare annual wellness visit, subsequent 02/20/2019   OSA (obstructive sleep apnea) 02/20/2019   Erectile dysfunction 02/15/2018   Nasal congestion 04/27/2017   Healthcare maintenance 02/15/2017   Advance care planning 02/15/2017   GERD (gastroesophageal reflux disease) 02/15/2017   Hyperglycemia 02/15/2017   Snoring 12/07/2016   Low back pain 09/29/2014  Knee pain 05/29/2012   Nephrolithiasis 04/28/2012   BENIGN POSITIONAL VERTIGO 02/13/2009   Past Medical History:  Diagnosis Date   Allergy    Arthritis    hands   Diverticulosis of colon 09/15/2006   Dysphagia    Hiatal hernia    Hyperlipidemia    Hypertension    Nasal polyps    Nephrolithiasis    PSA per Dr. Mardy   OSA (obstructive sleep apnea)    Family History  Problem Relation Age of Onset   Heart disease Mother    Prostate cancer Father     Colon cancer Maternal Grandmother    Past Surgical History:  Procedure Laterality Date   APPENDECTOMY  1988   CARPAL TUNNEL RELEASE Left    CATARACT EXTRACTION Bilateral    Left knee repair  1970's   LITHOTRIPSY     twice per pt   right knee surgery     arthroscopic   Sinus surgery for polyps     Social History   Occupational History   Occupation: Surveyor, minerals  Tobacco Use   Smoking status: Never   Smokeless tobacco: Never  Vaping Use   Vaping status: Never Used  Substance and Sexual Activity   Alcohol use: Yes    Alcohol/week: 0.0 standard drinks of alcohol    Comment: rare   Drug use: No   Sexual activity: Not Currently    Partners: Female

## 2023-11-20 NOTE — Progress Notes (Signed)
 Patient says that he has had low back and pain for several years. He recently had a hip injection under ultrasound that gave him temporary relief and helped with pain that went down the leg, but he still has pain and burning in the posterior hip. He was referred for evaluation and right SI joint injection today. He takes Advil as needed which does help his pain. He has also tried physical therapy which has not done much to help him; he tries to continue to do his home exercises.

## 2023-12-18 DIAGNOSIS — M5441 Lumbago with sciatica, right side: Secondary | ICD-10-CM | POA: Diagnosis not present

## 2023-12-18 DIAGNOSIS — G8929 Other chronic pain: Secondary | ICD-10-CM | POA: Diagnosis not present

## 2023-12-18 DIAGNOSIS — M5416 Radiculopathy, lumbar region: Secondary | ICD-10-CM | POA: Diagnosis not present

## 2023-12-18 DIAGNOSIS — M5442 Lumbago with sciatica, left side: Secondary | ICD-10-CM | POA: Diagnosis not present

## 2023-12-25 ENCOUNTER — Other Ambulatory Visit (HOSPITAL_BASED_OUTPATIENT_CLINIC_OR_DEPARTMENT_OTHER): Payer: Self-pay | Admitting: Cardiology

## 2024-01-19 ENCOUNTER — Encounter: Payer: Self-pay | Admitting: Cardiology

## 2024-02-13 ENCOUNTER — Ambulatory Visit: Admitting: Family Medicine

## 2024-02-13 ENCOUNTER — Encounter: Payer: Self-pay | Admitting: Family Medicine

## 2024-02-13 VITALS — BP 110/62 | HR 85 | Temp 98.6°F | Ht 71.0 in | Wt 209.0 lb

## 2024-02-13 DIAGNOSIS — I2510 Atherosclerotic heart disease of native coronary artery without angina pectoris: Secondary | ICD-10-CM | POA: Diagnosis not present

## 2024-02-13 DIAGNOSIS — R051 Acute cough: Secondary | ICD-10-CM | POA: Diagnosis not present

## 2024-02-13 DIAGNOSIS — J22 Unspecified acute lower respiratory infection: Secondary | ICD-10-CM | POA: Diagnosis not present

## 2024-02-13 LAB — POC COVID19 BINAXNOW: SARS Coronavirus 2 Ag: NEGATIVE

## 2024-02-13 MED ORDER — GUAIFENESIN-CODEINE 100-10 MG/5ML PO SOLN: 5.0000 mL | Freq: Three times a day (TID) | ORAL | 0 refills | Status: DC | PRN

## 2024-02-13 MED ORDER — BENZONATATE 100 MG PO CAPS: 100.0000 mg | ORAL_CAPSULE | Freq: Three times a day (TID) | ORAL | 0 refills | Status: DC | PRN

## 2024-02-13 NOTE — Assessment & Plan Note (Signed)
 Anticipate viral given short duration. Supportive measures reviewed - fluids rest, vitamin, OTC remedies, honey with lemon etc. Rx tessalon  perls. WASP for codeine  cough syrup printed today.  Update if not improving with treatment or if any worsening symptoms.

## 2024-02-13 NOTE — Patient Instructions (Signed)
 You have an acute respiratory respiratory infection, likely viral. Antibiotics are not needed for this.  Viral infections usually take 7-10 days to resolve.  The cough can last a few weeks to go away. Use medication as prescribed: tessalon  perls -swallow don't chew. If needed, may fill codeine  cough syrup.  Push fluids and plenty of rest. May take vitamin C, vitamin D to boost immune system.  Please return if you are not improving as expected, or if you have high fevers (>101.5) or difficulty swallowing or worsening productive cough. Call clinic with questions.  Good to see you today. I hope you start feeling better soon.

## 2024-02-13 NOTE — Progress Notes (Signed)
 Ph: [phone] Fax: [phone]   Patient ID: Trevor Ross, male    DOB: [birth year], 78 y.o.   MRN: [id number]  This visit was conducted in person.  BP 110/62   Pulse 85   Temp 98.6 F (37 C) (Oral)   Ht 5' 11 (1.803 m)   Wt 209 lb (94.8 kg)   SpO2 95%   BMI 29.15 kg/m    CC: cough Subjective:   HPI: Trevor Ross is a 78 y.o. male presenting on 02/13/2024 for Cough (With headache x 4 days )   Pleasant patient of Dr Elfredia new to me with history of BPV, GERD, OSA, HTN, CAD, HLD and LBP presents with 4d h/o cough, headache, mild congestion. Mildly productie of yellow mucous. Some fatigue. Deep cough with occ coughing fits. Cough worse in am. No trouble sleeping due to cough.   Started after mowing the lawn - dust exposure.   No fevers/chills, sore throat, ear or tooth pain, wheezing, body aches, abd pain, nausea, diarrhea. No PNdrainage.  No h/o asthma. Non smoker No h/o COPD.  No sick contacts at home.   Treating with advil and mucinex  with some benefit as well as OTC cough syrup, cough drops.      Relevant past medical, surgical, family and social history reviewed and updated as indicated. Interim medical history since our last visit reviewed. Allergies and medications reviewed and updated. Outpatient Medications Prior to Visit  Medication Sig Dispense Refill   aspirin  EC 81 MG tablet Take 1 tablet (81 mg total) by mouth daily. Swallow whole. 90 tablet 3   fluticasone  (FLONASE ) 50 MCG/ACT nasal spray PLACE 1 SPRAY INTO BOTH NOSTRILS 2 TIMESDAILY 16 g 5   meclizine  (ANTIVERT ) 25 MG tablet Take 25 mg by mouth 3 (three) times daily as needed for dizziness.     melatonin 5 MG TABS Take 1 tablet (5 mg total) by mouth at bedtime. 30 tablet 0   meloxicam (MOBIC) 15 MG tablet TAKE ONE TABLET BY MOUTH ONCE A DAY WITH MEALS.     multivitamin-lutein (OCUVITE-LUTEIN) CAPS capsule Take 1 capsule by mouth daily.     nitroGLYCERIN  (NITROSTAT ) 0.4 MG SL tablet Place 1  tablet (0.4 mg total) under the tongue every 5 (five) minutes as needed for chest pain (max 3 doses). 25 tablet 3   omeprazole  (PRILOSEC) 20 MG capsule TAKE TWO CAPSULES BY MOUTH DAILY 180 capsule 1   rosuvastatin  (CRESTOR ) 20 MG tablet TAKE ONE TABLET (20 MG TOTAL) BY MOUTH DAILY. PLEASE KEEP YOUR UPCOMING APPOINTMENT FOR REFILLS. 90 tablet 3   sildenafil  (VIAGRA ) 50 MG tablet Take 100 mg by mouth daily as needed for erectile dysfunction.     tamsulosin  (FLOMAX ) 0.4 MG CAPS capsule Take 0.4 mg by mouth daily after breakfast.     No facility-administered medications prior to visit.     Per HPI unless specifically indicated in ROS section below Review of Systems  Objective:  BP 110/62   Pulse 85   Temp 98.6 F (37 C) (Oral)   Ht 5' 11 (1.803 m)   Wt 209 lb (94.8 kg)   SpO2 95%   BMI 29.15 kg/m   Wt Readings from Last 3 Encounters:  02/13/24 209 lb (94.8 kg)  07/12/23 212 lb 9.6 oz (96.4 kg)  07/06/23 209 lb (94.8 kg)      Physical Exam Vitals and nursing note reviewed.  Constitutional:      Appearance: Normal appearance. He is not ill-appearing.  HENT:     Head: Normocephalic and atraumatic.     Right Ear: Hearing, tympanic membrane, ear canal and external ear normal. There is no impacted cerumen.     Left Ear: Hearing, tympanic membrane, ear canal and external ear normal. There is no impacted cerumen.     Nose: Nose normal. No mucosal edema, congestion or rhinorrhea.     Right Turbinates: Not enlarged or swollen.     Left Turbinates: Not enlarged or swollen.     Right Sinus: No maxillary sinus tenderness or frontal sinus tenderness.     Left Sinus: No maxillary sinus tenderness or frontal sinus tenderness.     Mouth/Throat:     Mouth: Mucous membranes are moist.     Pharynx: Oropharynx is clear. No oropharyngeal exudate or posterior oropharyngeal erythema.  Eyes:     Extraocular Movements: Extraocular movements intact.     Conjunctiva/sclera: Conjunctivae normal.      Pupils: Pupils are equal, round, and reactive to light.  Cardiovascular:     Rate and Rhythm: Normal rate and regular rhythm.     Pulses: Normal pulses.     Heart sounds: Murmur (2/6 systolic RUSB) heard.  Pulmonary:     Effort: Pulmonary effort is normal. No respiratory distress.     Breath sounds: No wheezing, rhonchi or rales.     Comments:  Bibasilar crackles present, with good air movement Musculoskeletal:     Cervical back: Normal range of motion and neck supple. No rigidity.     Right lower leg: No edema.     Left lower leg: No edema.  Lymphadenopathy:     Cervical: No cervical adenopathy.  Skin:    General: Skin is warm and dry.     Findings: No rash.  Neurological:     Mental Status: He is alert.  Psychiatric:        Mood and Affect: Mood normal.        Behavior: Behavior normal.       Results for orders placed or performed in visit on 02/13/24  POC COVID-19 BinaxNow   Collection Time: 02/13/24  8:46 AM  Result Value Ref Range   SARS Coronavirus 2 Ag Negative Negative    Assessment & Plan:   Problem List Items Addressed This Visit     Acute respiratory infection - Primary   Anticipate viral given short duration. Supportive measures reviewed - fluids rest, vitamin, OTC remedies, honey with lemon etc. Rx tessalon  perls. WASP for codeine  cough syrup printed today.  Update if not improving with treatment or if any worsening symptoms.       CAD (coronary artery disease)   Other Visit Diagnoses       Acute cough       Relevant Orders   POC COVID-19 BinaxNow (Completed)        Meds ordered this encounter  Medications   benzonatate  (TESSALON ) 100 MG capsule    Sig: Take 1 capsule (100 mg total) by mouth 3 (three) times daily as needed for cough.    Dispense:  30 capsule    Refill:  0   guaiFENesin -codeine  100-10 MG/5ML syrup    Sig: Take 5 mLs by mouth 3 (three) times daily as needed for cough (sedation precautions).    Dispense:  120 mL    Refill:  0     Orders Placed This Encounter  Procedures   POC COVID-19 BinaxNow    Patient Instructions  You have an acute respiratory respiratory infection,  likely viral. Antibiotics are not needed for this.  Viral infections usually take 7-10 days to resolve.  The cough can last a few weeks to go away. Use medication as prescribed: tessalon  perls -swallow don't chew. If needed, may fill codeine  cough syrup.  Push fluids and plenty of rest. May take vitamin C, vitamin D to boost immune system.  Please return if you are not improving as expected, or if you have high fevers (>101.5) or difficulty swallowing or worsening productive cough. Call clinic with questions.  Good to see you today. I hope you start feeling better soon.   Follow up plan: No follow-ups on file.  Anton Blas, MD

## 2024-02-15 ENCOUNTER — Ambulatory Visit: Payer: Self-pay

## 2024-02-15 ENCOUNTER — Other Ambulatory Visit: Payer: Self-pay | Admitting: Family Medicine

## 2024-02-15 MED ORDER — DOXYCYCLINE HYCLATE 100 MG PO TABS: 100.0000 mg | ORAL_TABLET | Freq: Two times a day (BID) | ORAL | 0 refills | Status: DC

## 2024-02-15 NOTE — Telephone Encounter (Signed)
 See my chart message

## 2024-02-15 NOTE — Telephone Encounter (Signed)
 FYI Only or Action Required?: Action required by provider: medication refill request. Pt is requesting a re-fill on cough syrup and an antibiotic called in to [facility] pharmacy.  Patient was last seen in primary care on 02/13/2024 by Rilla Baller, MD.  Called Nurse Triage reporting Medication Problem. Pt states that his cough has improved slightly, but feels he will need antibiotics to knock this thing out. Yellow phlegm at times with cough.  Symptoms began several days ago.  Interventions attempted: Prescription medications: Cough medicine, Tessalon .  Symptoms are: unchanged.  Triage Disposition: See PCP When Office is Open (Within 3 Days)  Patient/caregiver understands and will follow disposition?: Pt would like medications called in.               Copied from CRM #[id number]. Topic: Clinical - Red Word Triage >> Feb 15, 2024  1:44 PM Turkey A wrote: [facility] that prompted transfer to Nurse Triage: Patient is not feeling better patient is having headaches and swimming head. Reason for Disposition  Prescription request for new medicine (not a refill)  Answer Assessment - Initial Assessment Questions 1. DRUG NAME: What medicine do you need to have refilled?     Cough medicine is about half gone 2. REFILLS REMAINING: How many refills are remaining? Notes: The label on the medicine or pill bottle will show how many refills are remaining. If there are no refills remaining, then a renewal may be needed.     none 4. PRESCRIBER: Who prescribed it? Note: The prescribing doctor or group is responsible for refill approvals..     Dr. Rilla  SYMPTOMS: Do you have any symptoms?     Same as Monday  Protocols used: Medication Refill and Renewal Call-A-AH

## 2024-02-22 DIAGNOSIS — J40 Bronchitis, not specified as acute or chronic: Secondary | ICD-10-CM | POA: Diagnosis not present

## 2024-03-11 ENCOUNTER — Encounter: Payer: Self-pay | Admitting: Radiology

## 2024-04-22 ENCOUNTER — Ambulatory Visit (HOSPITAL_BASED_OUTPATIENT_CLINIC_OR_DEPARTMENT_OTHER): Admitting: Cardiology

## 2024-04-22 ENCOUNTER — Encounter (HOSPITAL_BASED_OUTPATIENT_CLINIC_OR_DEPARTMENT_OTHER): Payer: Self-pay | Admitting: Cardiology

## 2024-04-22 VITALS — BP 118/82 | HR 55 | Ht 71.0 in | Wt 219.9 lb

## 2024-04-22 DIAGNOSIS — I2510 Atherosclerotic heart disease of native coronary artery without angina pectoris: Secondary | ICD-10-CM

## 2024-04-22 DIAGNOSIS — Z7189 Other specified counseling: Secondary | ICD-10-CM | POA: Diagnosis not present

## 2024-04-22 DIAGNOSIS — Z8249 Family history of ischemic heart disease and other diseases of the circulatory system: Secondary | ICD-10-CM

## 2024-04-22 DIAGNOSIS — R011 Cardiac murmur, unspecified: Secondary | ICD-10-CM

## 2024-04-22 DIAGNOSIS — E7800 Pure hypercholesterolemia, unspecified: Secondary | ICD-10-CM | POA: Diagnosis not present

## 2024-04-22 DIAGNOSIS — Z8679 Personal history of other diseases of the circulatory system: Secondary | ICD-10-CM

## 2024-04-22 MED ORDER — ROSUVASTATIN CALCIUM 20 MG PO TABS: 20.0000 mg | ORAL_TABLET | Freq: Every day | ORAL | 3 refills | Status: AC

## 2024-04-22 NOTE — Progress Notes (Signed)
 Cardiology Office Note:  .    Date:  04/22/2024  ID:  Trevor Ross, DOB Dec 28, 1945, MRN [id number] PCP: Trevor Arlyss RAMAN, MD  [facility] Providers Cardiologist:  Shelda Bruckner, MD     History of Present Illness: .    Trevor Ross is a 78 y.o. male with a hx of arthritis, vascular calcification who is seen for follow up today. I initially met him 01/08/20 as a new consult at the request of Trevor Arlyss RAMAN, MD for the evaluation and management of possible coronary artery disease and chest pain.   CV history: CT coronary 2021 with Ca score 2616 (94th percentile). Moderate stenosis (50-69% in prox-mid LAD and RCA, negative by FFR.   Today: Has frequent neck and shoulder pain, chronic. Has occasional brief, sharp chest pain but no severe pressure/tightness in his chest. Lifting weights, about 35 lbs with dumbbells. Has leg pain that limits cardiovascular activity, trying to avoid knee replacement. No shortness of breath. Has some fatigue, sleeps well, nonlimiting, just feels that he can take a nap easily.   Had bronchitis in October, now resolved.  ROS:  Denies shortness of breath at rest or with normal exertion. No PND, orthopnea, LE edema or unexpected weight gain. No syncope or palpitations. ROS otherwise negative except as noted.   Studies Reviewed: SABRA    EKG Interpretation Date/Time:  Monday April 22 2024 09:39:01 EST Ventricular Rate:  54 PR Interval:  228 QRS Duration:  92 QT Interval:  396 QTC Calculation: 375 R Axis:   53  Text Interpretation: Sinus bradycardia with 1st degree A-V block When compared with ECG of 17-Apr-2023 16:59, No significant change was found Confirmed by Bruckner Shelda [phone]) on 04/22/2024 9:57:59 AM    Physical Exam:    VS:  BP 118/82   Pulse (!) 55   Ht 5' 11 (1.803 m)   Wt 219 lb 14.4 oz (99.7 kg)   SpO2 97%   BMI 30.67 kg/m    Wt Readings from Last 3 Encounters:  04/22/24 219 lb 14.4 oz (99.7 kg)  02/13/24  209 lb (94.8 kg)  07/12/23 212 lb 9.6 oz (96.4 kg)    GEN: Well nourished, well developed in no acute distress HEENT: Normal, moist mucous membranes NECK: No JVD CARDIAC: regular rhythm, normal S1 and S2, no rubs or gallops. 1-2/6 RUSB systolic murmur. VASCULAR: Radial and DP pulses 2+ bilaterally. No carotid bruits RESPIRATORY:  Clear to auscultation without rales, wheezing or rhonchi  ABDOMEN: Soft, non-tender, non-distended MUSCULOSKELETAL:  Ambulates independently SKIN: Warm and dry, no edema NEUROLOGIC:  Alert and oriented x 3. No focal neuro deficits noted. PSYCHIATRIC:  Normal affect    ASSESSMENT AND PLAN: .    CAD without angina Vascular calcification Family history of heart disease -CT shows elevated calcium  score and moderate CAD, but FFR was negative for flow limiting obstruction -continue aspirin  81 mg daily and rosuvastatin  20 mg daily -has PRN SL NG, understands how to use and risk with PDE5i -has chronic atypical (MSK) pain and GERD. -counseled on red flag warning signs that need immediate medical attention   Hypertension, history of -now well controlled off of medications -was on amlodipine  2.5 mg previously, stopped by Dr. Cleatus in 09/2022, patient has less lightheadedness   Hypercholesterolemia -continue rosuvastatin  20 mg daily given CAD, vascular calcification -last LDL 59 05/2023, at goal of <29. Due for physical next month   Murmur -soft, early, suspect aortic sclerosis. No symptoms of severe AS. Discussed  today -will hold on echo for now after discussion with him and his wife. Consider checking next year if murmur is louder -reviewed signs/symptoms to watch for  Cardiac risk counseling and prevention recommendations: -recommend heart healthy/Mediterranean diet, with whole grains, fruits, vegetable, fish, lean meats, nuts, and olive oil. Limit salt. -recommend moderate walking, 3-5 times/week for 30-50 minutes each session. Aim for at least 150  minutes/week. Goal should be pace of 3 miles/hours, or walking 1.5 miles in 30 minutes -recommend avoidance of tobacco products. Avoid excess alcohol.   Dispo: 12 mos or sooner as needed.  Signed, Shelda Bruckner, MD

## 2024-04-22 NOTE — Patient Instructions (Signed)

## 2024-05-27 ENCOUNTER — Other Ambulatory Visit: Payer: Self-pay | Admitting: Family Medicine

## 2024-05-27 DIAGNOSIS — K219 Gastro-esophageal reflux disease without esophagitis: Secondary | ICD-10-CM

## 2024-05-27 NOTE — Telephone Encounter (Signed)
 E-scribed refill.   Pls schedule annual exam and fasting labs, after 07/09/24, for additional refills.

## 2024-05-31 ENCOUNTER — Encounter: Payer: PPO | Admitting: Family Medicine

## 2024-07-09 ENCOUNTER — Ambulatory Visit: Payer: PPO
